# Patient Record
Sex: Female | Born: 1970 | Race: White | Hispanic: No | Smoking: Current every day smoker
Health system: Southern US, Community
[De-identification: ages and names within clinical notes are randomized; demographics above are authoritative.]

## PROBLEM LIST (undated history)

## (undated) DIAGNOSIS — J449 Chronic obstructive pulmonary disease, unspecified: Secondary | ICD-10-CM

## (undated) DIAGNOSIS — A630 Anogenital (venereal) warts: Secondary | ICD-10-CM

## (undated) DIAGNOSIS — F329 Major depressive disorder, single episode, unspecified: Secondary | ICD-10-CM

## (undated) HISTORY — DX: Major depressive disorder, single episode, unspecified: F32.9

## (undated) HISTORY — PX: TUBAL LIGATION: SHX77

## (undated) HISTORY — DX: Anogenital (venereal) warts: A63.0

---

## 1997-05-31 ENCOUNTER — Ambulatory Visit (HOSPITAL_COMMUNITY): Admission: RE | Admit: 1997-05-31 | Discharge: 1997-05-31 | Payer: Self-pay | Admitting: *Deleted

## 1997-06-09 ENCOUNTER — Inpatient Hospital Stay (HOSPITAL_COMMUNITY): Admission: AD | Admit: 1997-06-09 | Discharge: 1997-06-09 | Payer: Self-pay | Admitting: Obstetrics & Gynecology

## 1997-06-27 ENCOUNTER — Encounter (HOSPITAL_COMMUNITY): Admission: RE | Admit: 1997-06-27 | Discharge: 1997-07-25 | Payer: Self-pay | Admitting: *Deleted

## 1997-07-23 ENCOUNTER — Encounter: Admission: RE | Admit: 1997-07-23 | Discharge: 1997-07-23 | Payer: Self-pay | Admitting: Sports Medicine

## 1997-07-24 ENCOUNTER — Inpatient Hospital Stay (HOSPITAL_COMMUNITY): Admission: AD | Admit: 1997-07-24 | Discharge: 1997-07-26 | Payer: Self-pay | Admitting: Obstetrics

## 1997-07-31 ENCOUNTER — Encounter: Admission: RE | Admit: 1997-07-31 | Discharge: 1997-07-31 | Payer: Self-pay | Admitting: Family Medicine

## 1997-09-12 ENCOUNTER — Encounter: Admission: RE | Admit: 1997-09-12 | Discharge: 1997-09-12 | Payer: Self-pay | Admitting: Family Medicine

## 1997-11-25 ENCOUNTER — Emergency Department (HOSPITAL_COMMUNITY): Admission: EM | Admit: 1997-11-25 | Discharge: 1997-11-25 | Payer: Self-pay | Admitting: Emergency Medicine

## 1998-12-19 ENCOUNTER — Encounter (INDEPENDENT_AMBULATORY_CARE_PROVIDER_SITE_OTHER): Payer: Self-pay | Admitting: *Deleted

## 1999-01-05 ENCOUNTER — Emergency Department (HOSPITAL_COMMUNITY): Admission: EM | Admit: 1999-01-05 | Discharge: 1999-01-05 | Payer: Self-pay | Admitting: Emergency Medicine

## 1999-01-15 ENCOUNTER — Emergency Department (HOSPITAL_COMMUNITY): Admission: EM | Admit: 1999-01-15 | Discharge: 1999-01-16 | Payer: Self-pay | Admitting: Emergency Medicine

## 1999-01-27 ENCOUNTER — Emergency Department (HOSPITAL_COMMUNITY): Admission: EM | Admit: 1999-01-27 | Discharge: 1999-01-27 | Payer: Self-pay | Admitting: Emergency Medicine

## 1999-04-07 ENCOUNTER — Encounter: Payer: Self-pay | Admitting: Emergency Medicine

## 1999-04-07 ENCOUNTER — Emergency Department (HOSPITAL_COMMUNITY): Admission: EM | Admit: 1999-04-07 | Discharge: 1999-04-07 | Payer: Self-pay | Admitting: Emergency Medicine

## 1999-05-14 ENCOUNTER — Emergency Department (HOSPITAL_COMMUNITY): Admission: EM | Admit: 1999-05-14 | Discharge: 1999-05-15 | Payer: Self-pay | Admitting: Emergency Medicine

## 1999-05-25 ENCOUNTER — Encounter: Admission: RE | Admit: 1999-05-25 | Discharge: 1999-05-25 | Payer: Self-pay | Admitting: Family Medicine

## 1999-08-27 ENCOUNTER — Encounter: Admission: RE | Admit: 1999-08-27 | Discharge: 1999-08-27 | Payer: Self-pay | Admitting: Family Medicine

## 1999-10-20 ENCOUNTER — Inpatient Hospital Stay (HOSPITAL_COMMUNITY): Admission: EM | Admit: 1999-10-20 | Discharge: 1999-10-23 | Payer: Self-pay | Admitting: Emergency Medicine

## 1999-10-27 ENCOUNTER — Inpatient Hospital Stay (HOSPITAL_COMMUNITY): Admission: RE | Admit: 1999-10-27 | Discharge: 1999-11-07 | Payer: Self-pay | Admitting: Psychiatry

## 1999-11-05 ENCOUNTER — Encounter: Payer: Self-pay | Admitting: Neurology

## 2000-01-23 ENCOUNTER — Inpatient Hospital Stay (HOSPITAL_COMMUNITY): Admission: EM | Admit: 2000-01-23 | Discharge: 2000-01-28 | Payer: Self-pay | Admitting: *Deleted

## 2001-04-18 ENCOUNTER — Inpatient Hospital Stay (HOSPITAL_COMMUNITY): Admission: EM | Admit: 2001-04-18 | Discharge: 2001-04-26 | Payer: Self-pay | Admitting: Psychiatry

## 2001-05-02 ENCOUNTER — Inpatient Hospital Stay (HOSPITAL_COMMUNITY): Admission: EM | Admit: 2001-05-02 | Discharge: 2001-05-03 | Payer: Self-pay | Admitting: Psychiatry

## 2003-08-15 ENCOUNTER — Encounter: Admission: RE | Admit: 2003-08-15 | Discharge: 2003-08-15 | Payer: Self-pay | Admitting: Family Medicine

## 2003-08-15 ENCOUNTER — Encounter (INDEPENDENT_AMBULATORY_CARE_PROVIDER_SITE_OTHER): Payer: Self-pay | Admitting: *Deleted

## 2003-11-13 ENCOUNTER — Emergency Department (HOSPITAL_COMMUNITY): Admission: EM | Admit: 2003-11-13 | Discharge: 2003-11-14 | Payer: Self-pay | Admitting: Emergency Medicine

## 2003-11-13 ENCOUNTER — Emergency Department (HOSPITAL_COMMUNITY): Admission: EM | Admit: 2003-11-13 | Discharge: 2003-11-13 | Payer: Self-pay | Admitting: Family Medicine

## 2003-11-27 ENCOUNTER — Encounter: Admission: RE | Admit: 2003-11-27 | Discharge: 2003-11-27 | Payer: Self-pay | Admitting: Family Medicine

## 2005-10-08 ENCOUNTER — Emergency Department (HOSPITAL_COMMUNITY): Admission: EM | Admit: 2005-10-08 | Discharge: 2005-10-08 | Payer: Self-pay | Admitting: Family Medicine

## 2006-06-16 DIAGNOSIS — M26609 Unspecified temporomandibular joint disorder, unspecified side: Secondary | ICD-10-CM | POA: Insufficient documentation

## 2006-06-16 DIAGNOSIS — F339 Major depressive disorder, recurrent, unspecified: Secondary | ICD-10-CM | POA: Insufficient documentation

## 2006-06-16 DIAGNOSIS — R8789 Other abnormal findings in specimens from female genital organs: Secondary | ICD-10-CM

## 2006-06-16 DIAGNOSIS — A630 Anogenital (venereal) warts: Secondary | ICD-10-CM

## 2006-06-17 ENCOUNTER — Encounter (INDEPENDENT_AMBULATORY_CARE_PROVIDER_SITE_OTHER): Payer: Self-pay | Admitting: *Deleted

## 2007-05-26 ENCOUNTER — Emergency Department (HOSPITAL_COMMUNITY): Admission: EM | Admit: 2007-05-26 | Discharge: 2007-05-26 | Payer: Self-pay | Admitting: Family Medicine

## 2007-12-11 ENCOUNTER — Ambulatory Visit: Payer: Self-pay | Admitting: Sports Medicine

## 2007-12-11 ENCOUNTER — Encounter: Payer: Self-pay | Admitting: Family Medicine

## 2007-12-11 DIAGNOSIS — N920 Excessive and frequent menstruation with regular cycle: Secondary | ICD-10-CM

## 2007-12-11 LAB — CONVERTED CEMR LAB
HCT: 45.2 % (ref 36.0–46.0)
MCHC: 32.5 g/dL (ref 30.0–36.0)
MCV: 94.6 fL (ref 78.0–100.0)
Platelets: 159 10*3/uL (ref 150–400)
RDW: 14 % (ref 11.5–15.5)

## 2007-12-12 ENCOUNTER — Encounter: Admission: RE | Admit: 2007-12-12 | Discharge: 2007-12-12 | Payer: Self-pay | Admitting: Family Medicine

## 2008-01-11 ENCOUNTER — Ambulatory Visit: Payer: Self-pay | Admitting: Family Medicine

## 2008-02-08 ENCOUNTER — Ambulatory Visit: Payer: Self-pay | Admitting: Family Medicine

## 2008-12-12 ENCOUNTER — Encounter: Admission: RE | Admit: 2008-12-12 | Discharge: 2008-12-12 | Payer: Self-pay | Admitting: Family Medicine

## 2009-01-06 ENCOUNTER — Ambulatory Visit: Payer: Self-pay | Admitting: Family Medicine

## 2009-01-25 ENCOUNTER — Encounter (INDEPENDENT_AMBULATORY_CARE_PROVIDER_SITE_OTHER): Payer: Self-pay | Admitting: *Deleted

## 2009-01-25 DIAGNOSIS — F17200 Nicotine dependence, unspecified, uncomplicated: Secondary | ICD-10-CM

## 2009-12-15 ENCOUNTER — Encounter: Admission: RE | Admit: 2009-12-15 | Discharge: 2009-12-15 | Payer: Self-pay | Admitting: Family Medicine

## 2010-03-20 ENCOUNTER — Encounter: Payer: Self-pay | Admitting: Sports Medicine

## 2010-03-20 ENCOUNTER — Ambulatory Visit: Payer: Self-pay | Admitting: Family Medicine

## 2010-03-20 ENCOUNTER — Ambulatory Visit (HOSPITAL_COMMUNITY)
Admission: RE | Admit: 2010-03-20 | Discharge: 2010-03-20 | Payer: Self-pay | Source: Home / Self Care | Admitting: Family Medicine

## 2010-03-24 ENCOUNTER — Encounter: Admission: RE | Admit: 2010-03-24 | Discharge: 2010-03-24 | Payer: Self-pay | Admitting: Sports Medicine

## 2010-04-07 ENCOUNTER — Ambulatory Visit: Payer: Self-pay | Admitting: Family Medicine

## 2010-04-07 DIAGNOSIS — B341 Enterovirus infection, unspecified: Secondary | ICD-10-CM | POA: Insufficient documentation

## 2010-05-10 ENCOUNTER — Encounter: Payer: Self-pay | Admitting: Emergency Medicine

## 2010-05-17 LAB — CONVERTED CEMR LAB: Anti Nuclear Antibody(ANA): NEGATIVE

## 2010-05-19 NOTE — Assessment & Plan Note (Signed)
Summary: congested/rash?fungus to feet/[facility] team/[facility]   Vital Signs:  Patient profile:   40 year old female Weight:      159 pounds Temp:     98.4 degrees F oral Pulse rate:   84 / minute BP sitting:   112 / 84  (left arm) Cuff size:   regular  Vitals Entered By: Garen Grams LPN (March 20, 2010 10:42 AM) CC: chest pain x 4days Is Patient Diabetic? No   Primary Care Provider:  . [facility]  CC:  chest pain x 4days.  History of Present Illness: 40 yo female with pleuritic CP.  Present for 4d now, Located middle of chest and radiating to bilateral upper lung fields.  Has had a recent URI with cough, sniffles, father has a throat infection, some body aches for 4-5 days.  CP is worse with deep breathing and is burning in nature, worse with belching, no association with exertion and no better with rest, has been constant for the past 4 days.  She does smoke and has occasional wheezing but no SOB.  Cough is NP.  No sour brash, no ST, no epigastric burning, no throat clearing, pain is not positional.  Habits & Providers  Alcohol-Tobacco-Diet     Tobacco Status: current     Cigarette Packs/Day: 0.5  Current Medications (verified): 1)  Ventolin Hfa 108 (90 Base) Mcg/act Aers (Albuterol Sulfate) .... 2 Puffs Q 4 Hours As Needed For Wheezing 2)  Mobic 7.5 Mg Tabs (Meloxicam) .... One To Two Tabs By Mouth Daily For Pain.  Allergies (verified): No Known Drug Allergies  Review of Systems       See HPI  Physical Exam  General:  Well-developed,well-nourished,in no acute distress; alert,appropriate and cooperative throughout examination Head:  Normocephalic and atraumatic without obvious abnormalities. Eyes:  No corneal or conjunctival inflammation noted. EOMI. Perrl Ears:  External ear exam shows no significant lesions or deformities.  Otoscopic examination reveals clear canals, tympanic membranes are intact bilaterally without bulging, retraction, inflammation or discharge. Hearing  is grossly normal bilaterally. Nose:  Turbinates edematous and erythematous.  Clear discharge. Mouth:  Oral mucosa and oropharynx without lesions or exudates.  Neck:  No deformities, masses, or tenderness noted. Lungs:  Before nebs: Diffuse inspiratory wheezes, no rubs, good air movement.  After nebs: Clear to auscultation bilaterally. Heart:  Normal rate and regular rhythm. S1 and S2 normal without gallop, murmur, click, rub or other extra sounds. Abdomen:  Bowel sounds positive,abdomen soft and non-tender without masses, organomegaly or hernias noted. Extremities:  No edema. Additional Exam:  12 lead ECG NSR with incomplete RBBB, normal axis, no ST changes.  GI cocktail did not cause resolution of symptoms.  Albuterol/atrovent nebs did not cause resolution of pain symptoms.   Impression & Recommendations:  Problem # 1:  CHEST PAIN UNSPECIFIED (ICD-786.50) Assessment New Pleuritic CP without improvement with GI cocktail, nothing to suggest cardiac etiology, no improvement in pain with bronchodilators and associated with recent URI symptoms likely pleuritis/pleurisy.   Usually viral. Treat with NSAIDS. Checking ANA and D dimer as autoimmune etiologies are common and need to ensure not a PE. As she did have significant improvement in clinical signs of obstruction with BD nebs and with Hx smoking she needs to make appt with Dr. Raymondo Band in pharmacy clinic after she is over this illness for PFTs. RTC 1-2 weeks to reassess symptoms.  Orders: EKG- [facility] (EKG) CXR- [facility] (CXR) [facility]- Est  Level 4 (99214) ANA-[facility] (09811-91478) D-Dimer- [facility] (29562-13086)  Problem # 2:  PLEURISY (ICD-511.0) Assessment: New See #1.  Orders: ANA-[facility] (16109-60454) D-Dimer- [facility] [phone]) Atrovent 1mg  (Neb) [phone]) Albuterol Sulfate Sol 1mg  unit dose (Z3086) EMR miscellaneous medications (EMRORAL)  Complete Medication List: 1)  Ventolin Hfa 108 (90 Base) Mcg/act Aers (Albuterol sulfate) .... 2 puffs q 4  hours as needed for wheezing 2)  Mobic 7.5 Mg Tabs (Meloxicam) .... One to two tabs by mouth daily for pain.  Patient Instructions: 1)  I think you have pleurisy from a virus. 2)  This is self limited, take mobic for the pain. 3)  We gave you a GI cocktail and neb treatment in the office. 4)  You need to make an appt with Dr. Raymondo Band to do PFTs (pulmonary function testing). 5)  Come back to followup your symptoms in 1-2 weeks.  Return sooner if you develop severe shortness of breath. 6)  -Dr. Karie Schwalbe. Prescriptions: VENTOLIN HFA 108 (90 BASE) MCG/ACT AERS (ALBUTEROL SULFATE) 2 puffs q 4 hours as needed for wheezing Brand medically necessary #1 x 3   Entered and Authorized by:   Rodney Langton MD   Signed by:   Rodney Langton MD on 03/20/2010   Method used:   Print then Give to Patient   RxID:   [id number] MOBIC 7.5 MG TABS (MELOXICAM) One to two tabs by mouth daily for pain.  #30 x 0   Entered and Authorized by:   Rodney Langton MD   Signed by:   Rodney Langton MD on 03/20/2010   Method used:   Print then Give to Patient   RxID:   [id number]    Medication Administration  Medication # 1:    Medication: Albuterol Sulfate Sol 1mg  unit dose    Diagnosis: PLEURISY (ICD-511.0)    Dose: 2.5mg /91mL    Route: inhaled    Exp Date: 07/18/2011    Lot #: [id number]    Mfr: nephron    Patient tolerated medication without complications    Given by: Loralee Pacas CMA (March 20, 2010 11:34 AM)  Medication # 2:    Medication: Atrovent 1mg  (Neb)    Diagnosis: PLEURISY (ICD-511.0)    Dose: 0.5mg     Route: inhaled    Exp Date: 11/17/2011    Lot #: [id number]    Mfr: nephron    Patient tolerated medication without complications    Given by: Loralee Pacas CMA (March 20, 2010 11:35 AM)  Medication # 3:    Medication: EMR miscellaneous medications    Diagnosis: PLEURISY (ICD-511.0)    Dose:    Route: po    Exp Date: [id number]    Lot #: [id number]    Mfr: [facility]  pharmacy    Patient tolerated medication without complications    Given by: Loralee Pacas CMA (March 20, 2010 11:36 AM)  Orders Added: 1)  EKG- [facility] [EKG] 2)  CXR- [facility] [CXR] 3)  [facility]- Est  Level 4 [99214] 4)  ANA-[facility] [30160-10932] 5)  D-Dimer- [facility] [35573-22025] 6)  Atrovent 1mg  (Neb) [K2706] 7)  Albuterol Sulfate Sol 1mg  unit dose [J7613] 8)  EMR miscellaneous medications [EMRORAL]

## 2010-05-21 NOTE — Assessment & Plan Note (Signed)
Summary: F/U /RH   Vital Signs:  Patient profile:   40 year old female Weight:      158 pounds Temp:     98.4 degrees F oral Pulse rate:   103 / minute Pulse rhythm:   regular BP sitting:   119 / 88  (left arm) Cuff size:   regular  Vitals Entered By: Loralee Pacas CMA (April 07, 2010 10:03 AM) CC: cold x 4 days   Primary Care Provider:  . [facility]  CC:  cold x 4 days.  History of Present Illness: 40 yo female with URI symptoms for 4 days.  URI Symptoms Onset: 4d Description: runny nose, congestion, cough, stuffy ears. Modifying factors:  Pt with recent pleuritis that resolved with NSAIDS, neg ANA/d-dimer.  Now with another URI syndrome.  Symptoms Nasal discharge: CLEAR Fever: Subjective Sore throat: Minimal Cough: Yes, yellowship sputum, NB Wheezing: Minimal Ear pain: NO GI symptoms: NO Sick contacts: NO  Red Flags  Stiff neck: NO Dyspnea: NO Rash: NO Swallowing difficulty: NO  Sinusitis Risk Factors Headache/face pain: NO Double sickening: NO tooth pain: NO  Allergy Risk Factors Sneezing: NO Itchy scratchy throat: NO Seasonal symptoms: NO  Flu Risk Factors Headache: NO muscle aches: NO severe fatigue: NO    Current Medications (verified): 1)  Ventolin Hfa 108 (90 Base) Mcg/act Aers (Albuterol Sulfate) .... 2 Puffs Q 4 Hours As Needed For Wheezing 2)  Mobic 7.5 Mg Tabs (Meloxicam) .... One To Two Tabs By Mouth Daily For Pain. 3)  Theraflu Max-D Cold & Flu [phone] Mg Pack (Pseudoephedrine-Dm-Gg-Apap) .... Use As Directed  Allergies (verified): No Known Drug Allergies  Review of Systems       See HPI  Physical Exam  General:  Well-developed,well-nourished,in no acute distress; alert,appropriate and cooperative throughout examination Head:  Normocephalic and atraumatic without obvious abnormalities. No apparent alopecia or balding. Eyes:  No corneal or conjunctival inflammation noted. EOMI. Perrla.  Ears:  External ear exam  shows no significant lesions or deformities.  Otoscopic examination reveals clear canals, tympanic membranes are intact bilaterally without bulging, retraction, inflammation or discharge. Hearing is grossly normal bilaterally. Nose:  External nasal examination shows no deformity or inflammation. Nasal mucosa are pink and moist without lesions or exudates. Mouth:  papules with erythematous bases on soft palate. Neck:  No deformities, masses, or tenderness noted. Lungs:  Normal respiratory effort, chest expands symmetrically. Lungs are clear to auscultation, no crackles or wheezes. Heart:  Normal rate and regular rhythm. S1 and S2 normal without gallop, murmur, click, rub or other extra sounds. Abdomen:  Bowel sounds positive,abdomen soft and non-tender without masses, organomegaly or hernias noted.   Impression & Recommendations:  Problem # 1:  COXSACKIE VIRUS INFECTION (ICD-079.2) Assessment New Previous pleuritis resolved but likely Coxsackie B virus. Currently with signs suggestive of herpangina, usually caused by Coxsackie A however can be caused by Coxsackie B as well. Again recommend supportive measures. Hydration. Theraflu. Meloxicam. Vicks to chest. Humidifier. Pt still hasn't been to see Dr. Raymondo Band for PFTs as she has not yet been completely over her illnesses.  She plans to call to set up appt when this illness has resolved. RTC as needed.  Orders: [facility]- Est Level  3 (99213)  Complete Medication List: 1)  Ventolin Hfa 108 (90 Base) Mcg/act Aers (Albuterol sulfate) .... 2 puffs q 4 hours as needed for wheezing 2)  Mobic 7.5 Mg Tabs (Meloxicam) .... One to two tabs by mouth daily for pain. 3)  Theraflu  Max-d Cold & Flu [phone] Mg Pack (Pseudoephedrine-dm-gg-apap) .... Use as directed  Patient Instructions: 1)  You have another viral infection likely caused by the same virus, coxsackie virus which can cause pleuritis and pain with deep breathing like before as well as sore  throat and spots in the back of the throat like you have (herpangina). 2)  Theraflu. 3)  Continue your mobic for pain. 4)  Hydration. 5)  Come back to see Dr. Raymondo Band for PFTs when you are better. 6)  -Dr. Karie Schwalbe. Prescriptions: THERAFLU MAX-D COLD & FLU [phone] MG PACK (PSEUDOEPHEDRINE-DM-GG-APAP) Use as directed  #1 box x 0   Entered and Authorized by:   Rodney Langton MD   Signed by:   Rodney Langton MD on 04/07/2010   Method used:   Print then Give to Patient   RxID:   [id number]    Orders Added: 1)  [facility]- Est Level  3 [13244]

## 2010-06-08 ENCOUNTER — Encounter: Payer: Self-pay | Admitting: *Deleted

## 2010-09-04 NOTE — Discharge Summary (Signed)
[facility]  Patient:    Diane Pacheco                  MRN: [id number] Adm. Date:  [id number] Disc. Date: [id number] Attending:  Hipolito Bayley J                           Discharge Summary  INTRODUCTION:  Diane Pacheco is a 40 year old white female who [facility]ation of [facility].  Upon admission, she had thoughts of suicide with plan to overdose or cutting the wrists or jumping off the bridge.  The details of patients history and circumstances surrounding admission are available on the chart. Prior to admission she was on Klonopin, Effexor, Trazodone, Restoril and Remeron.  The patient was under the care of [facility] mental health center.  HOSPITAL COURSE:  After admission to the ward, patient was placed on special observation.  Routine blood work was ordered.  Her Effexor was restarted in dose 100 mg in the morning, 75 mg p.m., Klonopin 1 mg three times per day, and 2 mg at bedtime, Remeron 30 mg at bedtime, Restoril p.r.n. insomnia, and Seroquel p.r.n. insomnia.  On October 8, patient was still not sleeping, anxious, tremulous.  Had some confusing racing thoughts, denied suicidal ideations.  I increased Klonopin and ultimately decreased antidepressant, feeling that maybe these symptoms were coming from too high dose of antidepressant.  Next day, patient still had poor sleep and had some craving for alcohol.  There were no signs of alcohol withdrawal however.  No adjustments of medication were done since we felt we can wait a moment to see how things are getting from current medication.  On October 8, I started Zyprexa and Inderal in order to keep her up with anxious feelings and tremulousness.  Klonopin was also increased to 6 mg daily.  On October 10, patient was doing better and had successful session with father.  She felt better, denied dangerous thoughts, had some plan  for child care, and taking care of her own needs.  On October 11, she still had a pattern of seeking for medication, but much more rational and composed than before.  Anxiety and nervousness were gone and no suicidal or homicidal thoughts, no hallucinations.  She slept better.  Decision was made to discharge her home.  MEDICAL PROBLEMS:  During this brief hospitalization, patient did not experience any medical problems.  Review of her blood work showed no normal chemistry 17, slight elevation of TSH but with upper normal T3 uptake and free thyroxin.  Urine drug screen was positive for benzodiazepines, otherwise negative.  It was not a surprise since patient was on Klonopin.  Urinalysis was normal.  Throughout this brief hospitalization, patients vital signs were stable.  DISCHARGE DIAGNOSES: Axis I:     1. Depressive disorder not otherwise specified.             2. Generalized anxiety disorder. 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 Axis IV:    Moderate stressors, work problems and financial problems, as well             as family problems. Axis V:     Global assessment of function upon admission 40, maximum  for past year 46, upon discharge 50.  DISCHARGE RECOMMENDATIONS:  Patient is supposed to continue on Klonopin 2 mg three times a day, Effexor XR 150 mg in the morning and 75 mg afternoon, Remeron 15 mg at bedtime, Zyprexa 5 mg half or one at bedtime, Inderal 10 mg one three times a day, Vistaril 25 mg only if needed for anxiety.  I recommended to check thyroid profile at local family M.D. since there were minor abnormalities found, probably clinically nonsignificant.  Patient should call her psychiatrist if problems with medication or recurrence of depressive symptoms.  She will follow up with [facility] and patient is supposed to call for appointment.  Case worker was  working with her to schedule appointment but it is not clear whether it was accomplished.  Patient received a two week supply with one refill on the medications listed above.  PROGNOSIS:  Guarded, considering pattern of behavior from the past. Nevertheless, patient was discharged in maximum improved condition with no dangerousness or psychotic features present. DD:  02/07/99 TD:  02/08/00 Job: [id number]

## 2010-09-04 NOTE — Discharge Summary (Signed)
[facility]  Patient:    Diane Pacheco, Diane Pacheco Visit Number: [id number] MRN: [id number]          Service Type: PSY Location: [id number] [id number] Attending Physician:  Rachael Fee Dictated by:   Reymundo Poll Dub Mikes, M.D. Admit Date:  05/02/2001 Discharge Date: 05/03/2001                             Discharge Summary  CHIEF COMPLAINT AND PRESENTING ILLNESS:  This was the second admission, or readmission, to [facility] for this 40 year old female who was discharged from [facility] April 21, 2001.  Presented to the emergency room with her father, complained of her being overly drowsy, at times agitated and escalating over the past 2 days.  Has had some increased agitation and anxiety, January 1 through the January 10, after being visited by the Department of Social Services, who did a home visit to inspect related to her ability of caring for her children.  This caused her considerable agitation and anxiety.  Prior, she had a friend at the home that became involved in substance abuse.  PAST PSYCHIATRIC HISTORY:  As already stated, recently discharged from [facility] where she was discharged stable.  SUBSTANCE ABUSE HISTORY:  Sober for 15 months.  Has used marijuana recently.  MEDICATIONS:  Upon admission, Effexor XR, Lexapro, Klonopin, Restoril, Ambien, Neurontin, Seroquel.  ADMITTING  DIAGNOSES: Axis I:    1. Major depression, recurrent, with psychotic features.            2. Marijuana abuse. Axis II:   No diagnosis. Axis III:  No diagnosis. Axis IV:   Moderate. Axis V:    Global assessment of function upon admission 38, highest            global assessment of function in past year 68.  COURSE IN THE HOSPITAL:  She was admitted and started on intensive individual and group psychotherapy.  The approach was crisis intervention.  The medication was adjusted.  She was kept on Klonopin 0.5 twice a day, Restoril 30 at  bedtime for sleep, Seroquel 400 at bedtime, Effexor XR 75 twice a day, Neurontin 400 3 times a day, Risperdal 0.25 twice a day.  She responded to the medications, being back in the unit, worked on Pharmacologist.  Things with DSS were clarified.  She felt that she would be safe to go home, that she was in no danger.  denied any suicidal or homicidal ideas, therefore she was discharged to outpatient followup.  DISCHARGE  DIAGNOSES: Axis I:    1. Major depression with psychotic features.            2. Anxiety disorder not otherwise specified.            3. Marijuana abuse. Axis II:   No diagnosis. Axis III:  No diagnosis. Axis IV:   Moderate. Axis V:    Global assessment of function upon discharge 65.  DISCHARGE MEDICATIONS: 1. Klonopin 0.5 twice a day. 2. Restoril 30 at bedtime for sleep. 3. Seroquel 400 at bedtime. 4. Effexor XR 150, 75 twice a day. 5. Neurontin 400 1 3 times a day. 6. Risperdal 0.25 1 twice a day.  DISPOSITION:  Discharged to outpatient followup. Dictated by:   Reymundo Poll Dub Mikes, M.D. Attending Physician:  Rachael Fee DD:  06/07/01 TD:  06/07/01 Job: [id number]

## 2010-09-04 NOTE — H&P (Signed)
[facility]  Patient:    Diane Pacheco, Diane Pacheco Visit Number: [id number] MRN: [id number]          Service Type: PSY Location: 400 [id number] 02 Attending Physician:  Rachael Fee Dictated by:   Young Berry Scott, R.N. N.P. Admit Date:  05/02/2001 Discharge Date: 05/03/2001                     Psychiatric Admission Assessment  DATE OF ADMISSION:  May 02, 2001  DATE OF ASSESSMENT:  May 03, 2001, at 1:15 p.m.  PATIENT IDENTIFICATION:  This is a 40 year old Caucasian female who is separated.  She is a readmission here and she is a voluntary readmission.  HISTORY OF PRESENT ILLNESS:  This patient was readmitted after being discharged from [facility] on April 25, 2001.  She presented to the emergency room with her father who complained of her being increasingly agitated and drowsy over the past couple of days.  Today the patient reports that she had been increasingly agitated and anxious on January 13 after being visited by DSS Child Protective Services inspectors who were there to check on her care of her children.  This was because of an episode with the patients friend who had a behavioral outburst involving substance abuse and the police had been called.  Child welfare workers were calling subsequent to this episode.  The patient today reports no suicidal ideation or homicidal ideation but does report some persistent visual hallucinations which are nonthreatening to her seeing shadowy figures that ambulate alongside her in the hallways.  Denies any auditory or visual hallucinations.  She states her sleep has been satisfactory.  She admits that she had been smoking marijuana but reports that she has been clean and free of alcohol and any other substances.  PAST PSYCHIATRIC HISTORY:  The patient is followed at [facility] by Dr. Yetta Barre.  This is her fourth admission to [facility] with her last one being April 25, 2001.  She  also has a history of prior admissions at [facility], [facility], and [facility].  The patient has a history of at least two prior suicide attempts.  SUBSTANCE ABUSE HISTORY:  The patient reports that she has been sober for the past 15 months with a history of heavy alcohol use in the past.  The patients urine drug screen was positive for THC but negative for any other substances.  PAST MEDICAL HISTORY:  The patient is followed by a physician in [place] and she cannot remember his name.  Medical problems include temporomandibular joint dysfunction which is not bothering her at this time.  MEDICATIONS:  1. Klonopin 0.5 mg one tablet three times a day.  2. Klonopin 1 mg at bedtime.  3. Restoril 30 mg at bedtime.  4. Seroquel 200 mg two tablets at bedtime.  5. Effexor XR 150 mg one twice daily.  6. Neurontin 600 mg one three times a day.  7. Risperdal 0.25 mg one twice a day.  8. Lexapro 10 mg one half tablet daily.  9. Ambien 10 mg one at h.s. p.r.n. sleep. 10. Vistaril 50 mg one tablet q.6h. p.r.n. for anxiety.  DRUG ALLERGIES:  None.  PHYSICAL EXAMINATION:  GENERAL:  The patients physical examination was done at the [facility] Emergency Department and was essentially unremarkable.  LABORATORY DATA:  CMET and urinalysis were within normal limits at that time as was her CBC.  We have chosen  not to repeat any other labs at this time.  SOCIAL HISTORY:  The patient has been separated from her husband for the past two years.  She has three children ages 29, 2, and 6 months.  She has completed the 11th grade.  She is not working currently and lives with her children in Section 8 housing.  As stated, she is unemployed.  She has Medicaid to assist her in paying for medications and health care for her children and her father also assists her by giving her extra money.  FAMILY HISTORY:  Negative for mental illness or substance  abuse.  MENTAL STATUS EXAMINATION:  This is a young female who is fully alert, appears healthy with motor function grossly normal.  She is in no acute distress.  She is polite and cooperative with a full affect.  Speech is normal and relevant without pressure.  Mood is fairly euthymic.  Thought process is logical and goal directed.  She has no evidence of auditory hallucinations at this time. She does seem to be having some very mild visual hallucinations which seem intermittent in nature although I do not believe she is having them actually during our interview.  She has no evidence of paranoia or delusions, is well focused in her thought process and seems fairly candid about her activities at home and her plans for herself after discharge.  She seems well organized with good focus, is quite composed.  No evidence of suicidal ideation, no homicidal ideation.  Cognitive: Intact and oriented x 4.  Insight is fair to good. Intelligence is average.  Impulse control: Within normal limits.  Judgment: Satisfactory.  ADMISSION DIAGNOSES: Axis I:    1. Major depression, recurrent, moderate with psychotic features.            2. Tetrahydrocannabinol abuse.            3. Ethyl alcohol abuse currently in remission. Axis II:   Deferred. Axis III:  Temporomandibular joint pain, none currently. Axis IV:   Moderate problems with concerns regarding her child care and            satisfying any requirements of county Department of Social            Services. Axis V:    Current 40, past year 17.  INITIAL PLAN OF CARE:  Plan is to voluntarily admit the patient to treat her depression with a goal of stabilizing her mood.  We will decrease her Effexor to 75 mg p.o. b.i.d. and continue her Vistaril as is.  We have elected to stop her Lexapro.  This may be contributing to some of her mood fragility.  We have also decreased her Klonopin to 0.5 mg b.i.d. and will continue her Restoril at 30 mg p.o. q.h.s. and  continue her Seroquel at 400 mg p.o. q.h.s.  We have also decreased her Neurontin to 400 mg p.o. t.i.d.   ESTIMATED LENGTH OF STAY:  Four to five days. Dictated by:   Young Berry Scott, R.N. N.P. Attending Physician:  Rachael Fee DD:  05/03/01 TD:  05/03/01 Job: [id number]

## 2010-09-04 NOTE — H&P (Signed)
[facility]  Patient:    Diane Pacheco, Diane Pacheco Visit Number: [id number] MRN: [id number]          Service Type: PSY Location: 400 0404 02 Attending Physician:  Jeanice Lim Dictated by:   Young Berry Scott, N.P. Admit Date:  05/02/2001                     Psychiatric Admission Assessment  CONTINUATION  MEDICATIONS:  Klonopin 0.5 mg, 1 t.i.d. and 1 mg q.h.s., Restoril 30 mg q.h.s., Seroquel 200 mg, 2 tablets q.h.s., Effexor XR 150 mg, 1 b.i.d., Neurontin 600 mg, 1 three times a day, Risperdal 0.25 mg, 1 twice a day, Lexapro 10 mg, 1/2 tab daily, Ambien 10 mg, 1 q.h.s. p.r.n. for sleep, Vistaril 50 mg, 1 q.6h. p.r.n. for anxiety.  DRUG ALLERGIES:  None.  POSITIVE PHYSICAL FINDINGS:  The patients physical examination, done at [facility] Emergency Department, is essentially unremarkable.  Vital signs, on admission, were temperature 96.6, pulse 83, blood pressure 104/72.  She weighed 143 pounds and is 5 feet 4 inches tall.  LABORATORY DATA:  Her CMET and urinalysis were within normal limits.  Her urine drug screen showed benzodiazepines and THC.  Her CBC is within normal limits.  Her thyroid panel was satisfactory on the previous admission, so we will no repeat this at this time.  MENTAL STATUS EXAMINATION:  This is a healthy-appearing, young female, who is fully alert and in no acute distress.  She is polite and cooperative with a full affect.  She is composed and focused.  Speech is normal and relevant without pressure.  It is spontaneous.  Mood is euthymic.  Thought process is logical and goal directed.  She does seem to be having some mild visual hallucinations and no evidence of auditory hallucinations.  No evidence of paranoia or delusions and no dangerous ideations.  No suicidal or homicidal ideation.  Cognitively, she is intact and oriented x 4.  DIAGNOSES: Axis I:    1. Major depression, recurrent, moderate with psychotic features.             2. Marijuana abuse.            3. Alcohol abuse, currently in remission. Axis II:   Deferred. Axis III:  Temporomandibular joint pain with none now. Axis IV:   Moderate (problems related to her need for support as she remains            clean off alcohol and substances and her concerns about her ability            to care for her children properly and approval from DSS Protective            Services). Axis V:    Current 38; past year 92.  PLAN:  Voluntarily admit the patient to treat her depression with a goal of stabilizing her mood.  We have chosen to decrease her Effexor to 75 mg p.o. b.i.d. and to stop her Lexapro at this time.  We decreased her Klonopin to 0.5 mg b.i.d.  We will continue her Restoril at 30 mg p.o. q.h.s. and Ambien 10 mg p.r.n. if she remains awake and we have continued her Seroquel to 400 mg p.o. q.h.s. and decreased her Neurontin to 400 mg p.o. t.i.d.  ESTIMATED LENGTH OF STAY:  Four to five days. Dictated by:   Young Berry Scott, N.P. Attending Physician:  Jeanice Lim DD:  05/02/01 TD:  05/03/01 Job: [id number]  [id number]

## 2010-09-04 NOTE — H&P (Signed)
[facility]  Patient:    Diane Pacheco, WEICHERT Visit Number: [id number] MRN: [id number]          Service Type: PSY Location: 400 0404 02 Attending Physician:  Jeanice Lim Dictated by:   Young Berry Scott, N.P. Admit Date:  05/02/2001                     Psychiatric Admission Assessment  INCOMPLETE  DATE OF ASSESSMENT:  May 02, 2001 at 1:15 p.m.  IDENTIFYING INFORMATION:  This is a 40 year old Caucasian female, who is separated.  She is a voluntary readmission.  HISTORY OF PRESENT ILLNESS:  This patient, who is being readmitted after being discharged from [facility] on April 25, 2001, after being treated for depression with psychotic features, presented in the emergency room last night with her father, who complained of her being overly drowsy and, at other times, agitated and is escalating over the past two days. Today, the patient reports that she had had some increased agitation and anxiety on May 01, 2001 after being visited by Department of Social Services, Child [employer] aid/development worker, who did a home visit to inspect related to her capability of caring for her children.  This caused her considerable agitation and anxiety.  This visit was prompted by the fact that, several days prior, she had had a friend at the home that had become involved in substance abuse and had had a behavioral outburst and the police had been notified, prompting protective services visit to the home.  This friend, who was abusing substances and acting out, was a former fellow patient that the patient had taken home with her.  The patient reports that her sleep has been satisfactory.  She reports no suicidal ideation or homicidal ideation.  Denies any dangerous ideations but does report that some visual hallucinations tend to persist and that she still continues to see shadowy figures, which are not clear enough to  distinguish.  She denies any auditory hallucinations.  She reports that she remains clean off alcohol and has been clean for the last 15 months.  However, her urine drug screen showed positive for cannabis and she reports she has been smoking marijuana.  Denies any other substance abuse.  PAST PSYCHIATRIC HISTORY:  The patient is followed at [facility] by Dr. Yetta Barre.  This is her fourth admission to [facility] with her previous one being on April 18, 2001 through April 25, 2001.  The patient also has a history of prior admissions to [facility] for alcohol and substance abuse and to [place] and to [facility].  The patient also has a history of at least two prior suicide attempts.  She has a history of ETOH abuse and has been clean for 15 months.  SOCIAL HISTORY:  The patient has been separated from her husband for the past two years.  She has three children, ages 57, 3 and 6 months.  She completed the 11th grade.  She is not currently working.  She currently lives in a Section 8 housing with her three children and now has a friend, who is also a former patient here, named Victorino Dike, who is staying with her and is supporting her and her alcohol detox efforts.  Her father assists her with extra funds for cigarettes and other household expenses.  FAMILY HISTORY:  The patient denies any history of mental illness or substance abuse.  ALCOHOL/DRUG HISTORY:  As previously noted, patient has been sober from alcohol for 15 months with heavy use in the past.  Her urine drug screen was positive for [facility] and she admits to using this several times with her last use being three days ago.  She denies any other substance abuse.  MEDICAL HISTORY:  The patient has a primary care Naomii Kreger in [place], who she sees very irregularly and she cannot remember the doctors name.  She reports the only medical problem that she  has is temporomandibular joint dysfunction, which causes her occasional pain.  No pain at this time.  MEDICATIONS:  On admission, Dictated by:   Young Berry. Scott, N.P. Attending Physician:  Jeanice Lim DD:  05/02/01 TD:  05/02/01 Job: [id number]

## 2010-09-04 NOTE — H&P (Signed)
[facility]  Patient:    Diane Pacheco, Diane Pacheco Visit Number: [id number] MRN: [id number]          Service Type: PSY Location: 300 0300 02 Attending Physician:  Jeanice Lim Dictated by:   Candi Leash. Orsini, N.P. Admit Date:  04/18/2001                     Psychiatric Admission Assessment  IDENTIFYING INFORMATION:  A 40 year old separated white female, voluntarily admitted on April 18, 2001, for depression and suicidal ideation.  HISTORY OF PRESENT ILLNESS:  The patient presents with a history of depression and mood swings, has been unable to concentrate, has been isolating herself in bed, being very irritable and afraid to go in public spaces.  The patient identifies no specific stressors, having suicidal ideation but no intent, and was advised to come by her father.  The patient has a history of anxiety for years, panic attacks up to 5 per week, has been sleeping poorly.  Appetite has been satisfactory.  Denies any psychotic symptoms.  The patient reports compliance with her medications.  Denies any current suicidal or homicidal ideation.  PAST PSYCHIATRIC HISTORY:  Third visit to [facility], has been at [facility] in the past, [place] and [facility] for depression.  The patient has a history of biting and scratching herself.  Last time the patient has done these destructive activities has been in April 2002. The patient has a history of suicide attempts where she overdosed twice, last time in August of 2001.  The patient sees Lysle Dingwall on an outpatient basis at [facility], and sees Dr. Yetta Barre, her psychiatrist.  Last visit was in December 2002.  SOCIAL HISTORY:  She is a 40 year old separated white female, separated for 2 years, has 3 children, ages 8, 3 and 6 months.  The children are with patients father.  The patient is unemployed.  The patient completed the 11th grade, no legal problems.   She lives with her children.  FAMILY HISTORY:  None.  ALCOHOL DRUG HISTORY:  The patient smokes.  She has had a heavy alcohol use in the past, has been sober for 15 months, occasional THC.  PAST MEDICAL HISTORY:  Primary care Imanie Darrow is in [place], uncertain of the doctors name.  Medical problems are TMJ.  MEDICATIONS:  Seroquel 100 mg q.i.d., has been on that for 1 year, Klonopin 0.5 mg b.i.d. and 2 at q.h.s., Effexor XR 75 mg t.i.d., Restoril 15 mg at h.s. prescribed by Dr. Yetta [facility]re at [facility].  DRUG ALLERGIES:  No known allergies.  PHYSICAL EXAMINATION  REVIEW OF SYSTEMS:  Patient denies any fever or chills, had had a 25 pound weight loss although recently has had a baby and feels it is due to the pregnancy.  No blurred or double vision.  Patient wears glasses, has not had a recent eye exam.  No hearing loss, TMJ, no sinus pain.  Patient reports a partial plate.  No chest pain or palpitations.  Some heart racing with panic attacks.  No cough or shortness of breath.  Patient smokes.  No heartburn, constipation or diarrhea.  GU:  No dysuria, frequency, or hematuria.  History of tubal.  Sexually active.  MUSCULOSKELETAL:  No stiffness or swelling or joint pain, history of TMJ. No muscle aches or fractures.  SKIN:  No rashes, wounds, some dryness to her hands.  NEUROLOGIC:  No weakness, numbness on tingling, no headaches.  PSYCHIATRIC:  Patient with depression with a past suicide attempt.  ENDOCRINE:  No thyroid or diabetic problems.  LYMPHATIC:  No anemia or enlarged nodes.   ALLERGIES:  No environmental allergies.  GENERAL:  Patient is approximately 5 feet 4 inches tall.  She is 138 pounds.  LAST VITAL SIGNS:  98.8, 104, 20 respirations, blood pressure 105/13.  GENERAL APPEARANCE:  The patient is a 40 year old Caucasian female in no acute distress.  She is well-developed.  Appears her stated age.  The patient is fairly groomed, alert and  cooperative.  HEAD:  Normocephalic, she can raise his eyebrows.  Her hair is bleached, short, equally distributed.  EYES:  Pupils are equal and reactive to light. EOMs are intact bilaterally.  Her external ear canals are patent.  Her TMs are intact with some mild cerumen present.  Hearing is appropriate to conversation. No sinus tenderness, no nasal discharge.  Mucosa is moist.  Poor dentition.  Several broken teeth with a fair amount of plaque.  Tongue protrudes midline without tremor.  She  can clench her teeth and puff out her cheeks.  Uvula is midline.  Pharynx mildly red.  NECK:  Supple, no JVD, negative lymphadenopathy.  Thyroid is nonpalpable and nontender.  Trachea is midline.  CHEST:  Clear to auscultation.  No adventitious sounds.  No cough.  HEART:  Regular rate and rhythm, without murmurs, gallops or rubs.  Carotid pulses are equal and adequate.  No edema was noted.  BREAST EXAM:  Deferred.  ABDOMEN:  Soft, nontender abdomen.  No CVA tenderness.  MUSCULOSKELETAL:  No joint swelling or deformity.  Good range of motion. Muscle strength and tone is equal bilaterally.  SKIN:  Warm and dry with some dryness noted to hands.  Good turgor.  Nail beds are pink with good capillary refill.  No clubbing. Nail beds are short and clean.  NEUROLOGIC:  She is oriented x 3.  Cranial nerves are grossly intact.  Good grip strength bilaterally.  No involuntary movements.  Gait is normal. Cerebellar function is intact, with heel-to-shin, normal alternating movements.  Romberg is negative.  HEALTH MAINTENANCE ISSUES:  Addressed.  MENTAL STATUS EXAMINATION:  She is an alert, young middle-aged, overweight, unkempt, cooperative Caucasian female.  Speech is normal and relevant.  Mood is depressed and anxious.  Affect is depressed and crying, irritable and anxious.  Thought processes are coherent.  No evidence of psychosis.  No auditory or visual hallucinations.  No suicidal or homicidal  ideation.  No paranoia.  Cognitive function is intact.  Memory is good, judgment is fair, insight is fair.  ADMISSION DIAGNOSES:  Axis I:    Major depression, severe. Axis II:   Deferred. Axis III:  None. Axis IV:   Problems with primary support group. Axis V:    Current 30, estimated this past year 60-65.  INITIAL PLAN OF CARE:  Voluntary admission to [facility] for depression and suicidal ideation and anxiety.  Contract for safety, check every 15 minutes.  Will resume her routine medications, will obtain labs. Will encourage group activities.  Will consider a mood stabilizer.  Treatment plan was discussed with Dr. Kathrynn Running.  Our goal is to stabilize mood and thinking so patient can be safe, to increase coping skills, to follow up with her therapist and psychiatrist at [facility].  TENTATIVE LENGTH OF STAY:  3 to 5 days. Dictated by:   Candi Leash. Orsini, N.P. Attending Physician:  Jeanice Lim DD:  04/20/01 TD:  04/20/01 Job:  [id number]

## 2010-09-04 NOTE — Discharge Summary (Signed)
[facility]  Patient:    Diane Pacheco, Diane Pacheco                  MRN: [id number] Adm. Date:  [id number] Disc. Date: 11/07/99 Attending:  Marlyn Corporal Fabmy                           Discharge Summary  ADMISSION DIAGNOSIS: Axis I:    1. Anxiety disorder not otherwise specified.            2. Major depression. Axis II:   Deferred. Axis III:  Temporomandibular joint and status post overdose on            October 20, 1999. Axis IV:   Severe, psychosocial. Axis V:    Global assessment of function on admission 35, on discharge 70.  DISCHARGE DIAGNOSIS: Axis I:    1. Anxiety disorder not otherwise specified.            2. Major depressive disorder. Axis II:   Personality disorder not otherwise specified. Axis III:  Status post overdose on October 20, 1999. Axis IV:   Severe, psychosocial. Axis V:    Global assessment of function on admission 35, on discharge 70.  BRIEF HISTORY:  The patient is a 40 year old, twice separated, Caucasian female, who overdosed on Klonopin, Zoloft and Restoril and was stabilized at [facility]  She also has a history of alcohol dependency.  She was inebriated when she took the overdose.  She was extremely shaky, flapping around her arms and whole body, and experiencing significant feelings of anxiety and depression.  She was also complaining of insomnia, recurrent panic attacks, and almost unbearable anxiety.  The patient has had a history of panic attacks and was actually at ______ hospital last year for management of her anxiety and she was stabilized on Zoloft and Klonopin.  She has been tried on BuSpar and Celexa and Paxil without any positive benefit.  PERTINENT PHYSICAL AND LABORATORY DATA:  Attempts to stabilize her failed and eventually I asked for a neurological evaluation.  The patient was evaluated by Dr. Marcy Salvo who ordered an MRI.  The MRI was entirely negative.  Patient was confronted with the results of the  MRI and confronted with her secondary gain, with benefit.  Otherwise, laboratory data included urinalysis which was unremarkable.  A urine drug screen was positive for cannabinoids and benzodiazepines.  Liver profile was unremarkable.  CBC was normal.  Blood gasses were normal.  Thyroid profile was unremarkable.  HOSPITAL COURSE:  As noted, the patient was initially tremulous and shaky with flapping of her arms and entire body.  She actually received Motrin because of soreness in her muscles resulting from  these movements.  Patient was initially placed on Restoril 50 mg h.s. and Klonopin, but the Klonopin was then discontinued.  She had overdosed on it and it was not effective.  Zoloft was also discontinued and she was started of Effexor XR 75 mg daily, and Seroquel at increasing doses.  By the time of her discharge, the dosage of Seroquel had been increased to 150 mg t.i.d.  She was tried on Vistaril without effect.  Neurontin 300 mg q.i.d. was added to her medication with some benefit.  We used Trazodone 150 mg h.s. for sleep and in spite of all this medication, the patient still woke up two or three times every night.  CONDITION ON DISCHARGE:  At this point in time,  the patient has a tendency of fidget, but her course of treatment has resolved and she reports improvement in affect.  She has broken up with her boyfriend, who is an alcoholic.  She plans to attend AA meetings faithfully.  She denies suicidal or homicidal intent.  Her affect is full and her thinking is tight and goal directed.  She has regained her sense of humor.  DISCHARGE MEDICATIONS:  She was discharged with prescriptions for: 1. Seroquel 100 mg t.i.d. 2. Seroquel 25 mg two t.i.d. 3. Neurontin 300 mg q.i.d. 4. Effexor XR 75 mg q.d. 5. Restoril 30 mg h.s.  She is alert and fully functional.  She was advised not to drink alcoholic beverages and not to get pregnant on this medication.  She is to follow up at the  mental health center at [facility]. DD:  11/06/99 TD:  11/08/99 Job: [id number]

## 2010-09-04 NOTE — Consult Note (Signed)
[facility]  Patient:    Diane Pacheco, Diane Pacheco                  MRN: [id number] Proc. Date: 11/03/99 Adm. Date:  [id number] Attending:  Beryle Beams CC:         Dub Amis, M.D.                          Consultation Report  NEUROLOGY CONSULTATION  CHIEF COMPLAINT:  Shaking.  HISTORY OF PRESENT ILLNESS:  Ms. Beining is a 40 year old right-handed, white woman admitted after an intentional overdose of Klonopin, Restoril and Zoloft.  Since her acute admission and detox while she has been here at [facility] she has developed coarse tremors.  I am asked to assess this.  Patient says she has no prior history of tremors.  She had only been on Klonopin for four days without a prior history of benzodiazepine exposure.  No family history of tremors.  Patient reportedly took an overdose of 115 Klonopin tablets and 75 Restoril tablets as well as an undisclosed number of Zoloft tablets, in the presence of her boyfriend.  REVIEW OF SYSTEMS:  Positive for anxiety and tremors.  PAST MEDICAL HISTORY:  Significant for TMJ, anxiety and depression.  MEDICATIONS:  She is currently on Librium, Motrin, Neurontin, Seroquel, Effexor, Ativan, Trazodone, Cogentin, Tylenol and Mylanta.  She had been on Vistaril and Inderal without any effect on the tremor.  DRUG ALLERGIES:  No known drug allergies.  SOCIAL HISTORY:  She lived with her boyfriend.  Positive alcohol and cigarette use.  FAMILY HISTORY:  Negative for movement disorder.  PHYSICAL EXAMINATION:  VITAL SIGNS:  Temperature is 98.0, pulse is 100, respirations 20, blood pressure is 94/65.  HEAD:  Normocephalic, atraumatic, poor dentition.  NECK:  Supple without bruits.  HEART:  Regular rate and rhythm.  LUNGS:  Clear to auscultation.  EXTREMITIES:  Without edema.  NEUROLOGIC EXAM:  Mental status:  She is awake and alert.  She is oriented, has normal language.  She is  cooperative.  Cranial nerves:  Pupils are equal, round and reactive, extraocular movements are intact.  Visual fields are full to confrontation.  Facial sensation is normal.  Facial motor activity is normal.  Hearing is intact.  Palate symmetric without any clonus or myoclonus. Tongue is midline.  Motor exam:  She actually has normal bulk, tone and strength throughout.  No cogwheeling is noted.  She has an intermittent balancing and jiggling movement of her legs greater than arms, none in the head.  These seem to be present at rest at rest when there is slight tension on the limb and disappears with action as well as with distraction.  It doesnt look physiologic.  Her tone is normal, without cogwheeling or JV as mentioned.  There is no head tremor.  Reflexes are 2+ without pathologic changes.  She has downgoing toes bilaterally.  Finger to nose, heel to shin, tandem gait are normal.  She can heel walk and toe walk.  Normal sensory exam.  IMPRESSION:  Tremor.  It does not look like a typical involuntary movement disorder such as one might see with extrapyramidal symptoms, Parkinsons disease, benign essential tremor, withdrawal syndrome, or with a classical basal ganglia lesion.  Certainly  could be functional or perhaps related to anxiety, nervousness, like a habit.  It is definitely distractible.  RECOMMENDATION:  Would screen with MRI of the brain to  rule out basal ganglia pathology, but doubt we will find that.  Would not use specific medications, but just encourage patient that this will clear up spontaneously over time. DD:  11/03/99 TD:  11/04/99 Job: [id number]

## 2010-09-04 NOTE — H&P (Signed)
[facility]  Patient:    Diane Pacheco, Diane Pacheco                  MRN: [id number] Adm. Date:  [id number] Disc. Date: [id number] Attending:  Marlyn Corporal Fabmy Dictator:   Candi Leash. Theressa Stamps, N.P.                   Psychiatric Admission Assessment  DATE OF ADMISSION:  January 23, 2000  PATIENT IDENTIFICATION:  This is a 40 year old white female voluntarily admitted to [facility] on January 23, 2000 for depression and suicidal ideation.  HISTORY OF PRESENT ILLNESS:  The patient reports she has been feeling rather depressed for the past two weeks with crying and feelings of hopelessness for the past five days.  This has seemed to be somewhat associated with problems with her boyfriend.  She had thoughts of suicide with a plan to either overdose, cut her wrists, or jump off a bridge; just something so her children would not see.  She says she has been sleeping okay but she has been waking up frequently.  Her appetite has been poor.  She reports no auditory or visual hallucinations, no paranoia, no homicidal ideation.  She has a history of anxiety attacks.  She has a history of biting and scratching her hands.  PAST PSYCHIATRIC HISTORY:  She has had an admission to [facility] in June 2001.  She has been at [facility] x 2 and at [facility].  She had a recent admission to [facility] in September 2001 for anxiety.  SUBSTANCE ABUSE HISTORY:  She states she is a binge drinker; last episode like that was last September.  She tries to escape from her father when she does that.  She has an occasional marijuana use.  PAST MEDICAL HISTORY:  Primary care Diane Pacheco: She goes to [facility].  Medical problems include TMJ.  Medications: Klonopin 1 mg, she takes three during the day and two at bedtime; Effexor 75 mg t.i.d.; trazodone 100 mg q.h.s.; Restoril 50 mg; Remeron, she says she takes 45 mg q.h.s.   Drug allergies: No known drug allergies.  SOCIAL HISTORY:  A 40 year old separated white female, has been separated for about a year; she has been married for two years.  She has two children, 6-year-old son, 55.49-year-old daughter.  She lives with her children.  She is unemployed.  She has an 11th grade education.  She smokes one pack a day since 40 years of age.  Family history: Brother with depression, sister has anxiety.  PHYSICAL EXAMINATION:  Physical examination was done at [facility].  MENTAL STATUS EXAMINATION:  She is an alert, cooperative, 40 year old white female lying in bed.  She is anxious, slightly irritable with questions.  She is casually dressed.  Speech is normal and relevant.  Mood is anxious.  Affect is anxious, somewhat shaky.  Thought processes are intact.  There is no evidence of psychosis.  Negative auditory or visual hallucinations, negative delusions, negative flight of ideas, negative homicidal ideation and no current suicidal ideation today.  Cognitive: Oriented x 3.  Memory is intact. Average intelligence.  ADMISSION DIAGNOSES: Axis I:    1. Depressive disorder.            2. General anxiety. Axis II:   Deferred. Axis III:  Temporomandibular joint syndrome. Axis IV:   Moderate with primary support group, occupation, and economic  problems. Axis V:    Current is 40, past year is 60.  INITIAL PLAN OF CARE:  Voluntary admission to [facility] for depressive disorder and generalized anxiety disorder.  Contract for safety. Check every 15 minutes.  Her routine medications will be initiated.  Seroquel was also added for insomnia a bedtime p.r.n. and Vistaril for anxiety.  The patient is encouraged to go to groups.  ESTIMATED LENGTH OF STAY:  Three to five days. DD:  01/24/00 TD:  01/24/00 Job: [id number]

## 2010-09-04 NOTE — Discharge Summary (Signed)
[facility]  Patient:    Diane Pacheco, Diane Pacheco Visit Number: [id number] MRN: [id number]          Service Type: PSY Location: [id number] [id number] Attending Physician:  Rachael Fee Dictated by:   Reymundo Poll Dub Mikes, M.D. Admit Date:  05/02/2001 Discharge Date: 05/03/2001                             Discharge Summary  CHIEF COMPLAINT AND HISTORY OF PRESENT ILLNESS:  This was the first admission to [facility] for this 40 year old female voluntarily admitted for depression and suicidal ideas.  History of depression, mood swings, unable to concentrate, isolating herself in bed, being very irritable and afraid to go into public spaces.  The patient identified no specific stressor.  Having suicidal ideas with no intent.  Was advised to come by her father.  History of anxiety for years, panic attacks up to five per week, sleeping poorly, appetite has been satisfactory, denies any psychotic symptoms.  Reports compliance with medications.  PAST PSYCHIATRIC HISTORY:  Has been at [facility] [place] for depression.  Has a history of biting and scratching herself.  SUBSTANCE ABUSE HISTORY:  Smokes.  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  Occasional use of marijuana.  MEDICATIONS ON ADMISSION: 1. Seroquel 100 mg four times a day for a year now. 2. Klonopin 0.5 mg twice a day and 2 mg at bedtime. 3. Effexor XR 75 mg three times a day. 4. Restoril 15 mg at bedtime.  MENTAL STATUS EXAMINATION ON ADMISSION:  Well-nourished, well-developed, alert, cooperative female who is unkempt.  Speech was normal and relevant. Mood was depressed and anxious.  Affect was depressed and crying, irritable, and anxious.  Thought processes: Coherent; no evidence of psychosis, no auditory or visual hallucinations, no suicidal or homicidal ideation, no paranoia.  Cognitive: Well preserved.  ADMITTING  DIAGNOSES: Axis I:    Major depression, recurrent. Axis II:   No diagnosis. Axis III:  No diagnosis. Axis IV:   Moderate. Axis V:    Global assessment of functioning upon admission 30, highest global            assessment of functioning in the last year 60-65.  HOSPITAL COURSE:  She was admitted and started in intensive individual and group psychotherapy.  Continued to experience depression as well as some disturbance with perceptions, seeing things in the corner of her eye, still feeling very anxious.  No particular trigger.  We went ahead and continued to work with the medication, increased the Effexor to 225 mg per day, Neurontin 300 mg three time a day, Seroquel 200 mg twice a day and 400 mg at bedtime. We went ahead and again increased the Effexor to 150 mg twice a day and the Neurontin to 600 mg three times a day.  Due to the persistent depression she was given Lexapro 5 mg every day and she was started on Risperdal 0.25 mg for the disturbance of perception.  On January 8 she had obtained full benefit from the hospitalization, felt like she was ready to be discharged.  Her mood had improved.  The images that she was seeing, she felt they were not real; she just had to tell herself that they were not.  Would like to continue to work on an outpatient basis.  As she was not suicidal or  homicidal and willing to work on outpatient treatment, we went ahead and discharged her to outpatient followup.  DISCHARGE DIAGNOSES: Axis I:    1. Major depression, recurrent.            2. Rule out psychotic features.            3. Anxiety disorder, not otherwise specified. Axis II:   No diagnosis. Axis III:  No diagnosis. Axis IV:   Moderate. Axis V:    Global assessment of functioning upon discharge 55-60.  DISCHARGE MEDICATIONS:  1. Klonopin 0.5 mg three times a day.  2. Klonopin 1 mg at bedtime.  3. Restoril 30 mg at bedtime.  4. Seroquel 200 mg two at bedtime.  5. Effexor XR 150 mg  twice a day.  6. Neurontin 600 mg one three times a day.  7. Risperdal 0.25 mg one twice a day.  8. Lexapro 10 mg half daily.  9. Ambien 10 mg at bedtime for sleep. 10. Vistaril as needed for anxiety.  FOLLOWUP:  [facility]. Dictated by:   Reymundo Poll Dub Mikes, M.D. Attending Physician:  Rachael Fee DD:  06/07/01 TD:  06/07/01 Job: [id number]
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
## 2011-06-28 ENCOUNTER — Telehealth: Payer: Self-pay | Admitting: Family Medicine

## 2011-06-28 ENCOUNTER — Encounter: Payer: Self-pay | Admitting: Family Medicine

## 2011-06-28 ENCOUNTER — Ambulatory Visit (INDEPENDENT_AMBULATORY_CARE_PROVIDER_SITE_OTHER): Payer: Medicare Other | Admitting: Family Medicine

## 2011-06-28 DIAGNOSIS — L600 Ingrowing nail: Secondary | ICD-10-CM | POA: Diagnosis not present

## 2011-06-28 DIAGNOSIS — J45901 Unspecified asthma with (acute) exacerbation: Secondary | ICD-10-CM | POA: Diagnosis not present

## 2011-06-28 MED ORDER — MINOCYCLINE HCL 100 MG PO CAPS: 100.0000 mg | ORAL_CAPSULE | Freq: Two times a day (BID) | ORAL | Status: DC

## 2011-06-28 MED ORDER — PREDNISONE 50 MG PO TABS: ORAL_TABLET | ORAL | Status: AC

## 2011-06-28 MED ORDER — GUAIFENESIN-CODEINE 100-10 MG/5ML PO SYRP: 5.0000 mL | ORAL_SOLUTION | Freq: Three times a day (TID) | ORAL | Status: DC | PRN

## 2011-06-28 MED ORDER — GUAIFENESIN-CODEINE 100-10 MG/5ML PO SYRP: 5.0000 mL | ORAL_SOLUTION | Freq: Three times a day (TID) | ORAL | Status: AC | PRN

## 2011-06-28 NOTE — Progress Notes (Signed)
  Subjective:    Patient ID: Diane Pacheco, female    DOB: 23-Jun-1970, 41 y.o.   MRN: [id number]  HPI #1. Asthma exacerbation: Patient had URI which again a bit over 3 weeks ago. She had sinus congestion and drainage. This became an asthma exacerbation which she states is the usual course of her illnesses. She has been having difficulty with more cough and wheezing for the past 2-3 weeks. She states she is actually got the night the cough when she wakes to urinate she will begin coughing after that. No fevers or chills. No nausea or vomiting. She is using her albuterol inhaler to 3 times a day with intermittent relief.  #2. Ingrown toenail: Patient is an ingrown toenail that is worsening for the past 2-3 weeks. She states that she had an appointment for her complete physical exam but was told that she would have to come back a third time to have general removed. She states it is painful and red. She has not noticed any drainage. No fevers or chills.  The following portions of the patient's history were reviewed and updated as appropriate: allergies, current medications, past medical history, family and social history, and problem list.  Patient is a nonsmoker.   Review of Systems See HPI above for review of systems.       Objective:   Physical Exam Gen:  Alert, cooperative patient who appears stated age in no acute distress.  Vital signs reviewed. HEENT:  [place]/AT.  EOMI, PERRL. nasal turbinates erythematous and enlarged bilaterally. MMM, tonsils non-erythematous, non-edematous.  Posterior oropharynx is erythematous. External ears WNL, Bilateral TM's normal without retraction, redness or bulging.  Cardiac:  Regular rate and rhythm without murmur auscultated.  Good S1/S2. Lungs:  Diffuse wheezing throughout all lung fields. Extremities no lower extremity swelling noted. Left toe does have ingrown medial toenail. It is erythematous. It does not actually appear infected. Unable to drain or  express any purulence.       Assessment & Plan:

## 2011-06-28 NOTE — Patient Instructions (Signed)
Keep using her inhaler as needed. You can use this every 4-6 hours if you need to. I'm going to send an antibiotic and steroid for you. Take the antibiotic minocycline twice daily with food for 7 days. Take the prednisone one pill a day for the next 5 days. The cough syrup you can take every 8 hours if you need to be I will also send in the referral for podiatry.

## 2011-06-28 NOTE — Progress Notes (Signed)
Addended by: [place] Cellar on: 06/28/2011 03:38 PM   Modules accepted: Orders

## 2011-06-28 NOTE — Assessment & Plan Note (Signed)
Treatment prednisone and minocycline. I would believe patient is on her way to COPD. Therefore we are treating this more like a COPD exacerbation. She is a long-term smoker.

## 2011-06-28 NOTE — Telephone Encounter (Signed)
error 

## 2011-06-28 NOTE — Assessment & Plan Note (Signed)
Plan to refer to podiatry. No signs of infection today. Counseled to cut toenails straight across the

## 2011-07-02 ENCOUNTER — Telehealth: Payer: Self-pay | Admitting: *Deleted

## 2011-07-05 ENCOUNTER — Telehealth: Payer: Self-pay | Admitting: Family Medicine

## 2011-07-05 NOTE — Telephone Encounter (Signed)
error 

## 2011-07-06 NOTE — Telephone Encounter (Signed)
Referral done by D. Loring.Diane Pacheco [place]

## 2011-07-14 DIAGNOSIS — L600 Ingrowing nail: Secondary | ICD-10-CM | POA: Diagnosis not present

## 2011-07-15 ENCOUNTER — Other Ambulatory Visit (HOSPITAL_COMMUNITY)
Admission: RE | Admit: 2011-07-15 | Discharge: 2011-07-15 | Disposition: A | Discharge status: Home / Self Care | Payer: Medicare Other | Source: Ambulatory Visit | Attending: Family Medicine | Admitting: Family Medicine

## 2011-07-15 ENCOUNTER — Ambulatory Visit (INDEPENDENT_AMBULATORY_CARE_PROVIDER_SITE_OTHER): Payer: Medicare Other | Admitting: Family Medicine

## 2011-07-15 ENCOUNTER — Encounter: Payer: Self-pay | Admitting: Family Medicine

## 2011-07-15 VITALS — BP 130/87 | HR 94 | Temp 98.5°F | Ht 64.0 in | Wt 173.4 lb

## 2011-07-15 DIAGNOSIS — Z124 Encounter for screening for malignant neoplasm of cervix: Secondary | ICD-10-CM | POA: Insufficient documentation

## 2011-07-15 DIAGNOSIS — E669 Obesity, unspecified: Secondary | ICD-10-CM | POA: Diagnosis not present

## 2011-07-15 DIAGNOSIS — F17200 Nicotine dependence, unspecified, uncomplicated: Secondary | ICD-10-CM

## 2011-07-15 NOTE — Progress Notes (Signed)
  Subjective:    Patient ID: [name], female    DOB: 1970-06-09, 41 y.o.   MRN: [id number]  HPI CC: PAP and obesity  PAP: last pap 2009 normal. No new sexual partners. H/o condyloma acuminatum. Denies vaginal DC, irritation. Not on period today  Obesity: Complains of weight gain over past 91mo. Poor diet. Little exercise. Suffers from depression. Primary caregiver for disabled husband.   Tobacco: continues to smoke 1/2 ppd. Understands risks associated w/ behavior. Husband smokes as well. Unwilling to try to quit now due to life stressors. Pt states she isn't able to take any medications for her depression b/c they make her feel poorly. When she tries to quit smoking, her depression gets worse, so she is unwilling to quit at this time.   Review of Systems Negative: CP, SOB, n/v/d/c, hematochezia, hematemesis, hematuria, dysuria, dizziness, syncope, vision change, change in sensation, HA    Objective:   Physical Exam  CV: RRR, no m/r/g Res: CTAB, normal effort, no wheezes LE: no edema, normal pulses HEENT: No cervical lymphadenopathy, oropharynx normal, dentures in place.       Assessment & Plan:

## 2011-07-15 NOTE — Patient Instructions (Signed)
Thank you for coming into clinic today. I will call you if any of your lab work comes back abnormal. Please follow up with me as needed for any other problems. Have a great weekend

## 2011-07-15 NOTE — Assessment & Plan Note (Addendum)
BMI 30 today. Wt gain of 6lb over past few wks., likely due to poor diet, lack of exercise. ROS unremarkable. Pt encouraged to exercise, discussed healthy eating habits. CMET, HgbA1c, and Direct LDL today. If continues to gain will refer nutrition.

## 2011-07-16 LAB — COMPREHENSIVE METABOLIC PANEL
ALT: 12 U/L (ref 0–35)
AST: 27 U/L (ref 0–37)
Alkaline Phosphatase: 72 U/L (ref 39–117)
Creat: 0.8 mg/dL (ref 0.50–1.10)
Sodium: 138 mEq/L (ref 135–145)
Total Bilirubin: 0.3 mg/dL (ref 0.3–1.2)
Total Protein: 6.7 g/dL (ref 6.0–8.3)

## 2011-07-16 LAB — LDL CHOLESTEROL, DIRECT: Direct LDL: 110 mg/dL — ABNORMAL HIGH

## 2011-07-19 ENCOUNTER — Encounter: Payer: Self-pay | Admitting: Family Medicine

## 2011-07-19 DIAGNOSIS — Z124 Encounter for screening for malignant neoplasm of cervix: Secondary | ICD-10-CM | POA: Insufficient documentation

## 2011-07-19 NOTE — Assessment & Plan Note (Signed)
Not ready to quit at this time. Has tried welbutrin in the past. Will consider chantix in the future. Will address again at next appt. Pt aware of health risks associated w/ smoking.

## 2011-07-19 NOTE — Assessment & Plan Note (Signed)
Will call pt w/ results if abnormal. Repeat in 3 yrs. Pt w/o previous abnormal pap per pt.

## 2011-08-23 ENCOUNTER — Other Ambulatory Visit: Payer: Self-pay | Admitting: Family Medicine

## 2011-08-23 MED ORDER — ALBUTEROL SULFATE HFA 108 (90 BASE) MCG/ACT IN AERS: 2.0000 | INHALATION_SPRAY | RESPIRATORY_TRACT | Status: DC | PRN

## 2011-10-28 ENCOUNTER — Ambulatory Visit
Admission: RE | Admit: 2011-10-28 | Discharge: 2011-10-28 | Disposition: A | Discharge status: Home / Self Care | Payer: Medicare Other | Source: Ambulatory Visit | Attending: Family Medicine | Admitting: Family Medicine

## 2011-10-28 ENCOUNTER — Ambulatory Visit (INDEPENDENT_AMBULATORY_CARE_PROVIDER_SITE_OTHER): Payer: Medicare Other | Admitting: Family Medicine

## 2011-10-28 ENCOUNTER — Encounter: Payer: Self-pay | Admitting: Family Medicine

## 2011-10-28 VITALS — BP 133/94 | HR 120 | Temp 98.1°F | Ht 64.0 in | Wt 162.7 lb

## 2011-10-28 DIAGNOSIS — J40 Bronchitis, not specified as acute or chronic: Secondary | ICD-10-CM | POA: Insufficient documentation

## 2011-10-28 DIAGNOSIS — R05 Cough: Secondary | ICD-10-CM

## 2011-10-28 DIAGNOSIS — R509 Fever, unspecified: Secondary | ICD-10-CM | POA: Diagnosis not present

## 2011-10-28 DIAGNOSIS — R0989 Other specified symptoms and signs involving the circulatory and respiratory systems: Secondary | ICD-10-CM | POA: Diagnosis not present

## 2011-10-28 MED ORDER — IPRATROPIUM BROMIDE 0.02 % IN SOLN
0.5000 mg | Freq: Once | RESPIRATORY_TRACT | Status: AC
  Administered 2011-10-28: 0.5 mg via RESPIRATORY_TRACT

## 2011-10-28 MED ORDER — DOXYCYCLINE HYCLATE 100 MG PO TABS: 100.0000 mg | ORAL_TABLET | Freq: Two times a day (BID) | ORAL | Status: AC

## 2011-10-28 MED ORDER — PREDNISONE 50 MG PO TABS: ORAL_TABLET | ORAL | Status: AC

## 2011-10-28 MED ORDER — ALBUTEROL SULFATE HFA 108 (90 BASE) MCG/ACT IN AERS: 2.0000 | INHALATION_SPRAY | RESPIRATORY_TRACT | Status: DC | PRN

## 2011-10-28 MED ORDER — ALBUTEROL SULFATE (2.5 MG/3ML) 0.083% IN NEBU
2.5000 mg | INHALATION_SOLUTION | Freq: Once | RESPIRATORY_TRACT | Status: AC
  Administered 2011-10-28: 2.5 mg via RESPIRATORY_TRACT

## 2011-10-28 NOTE — Progress Notes (Signed)
  Subjective:    Patient ID: Diane Pacheco, female    DOB: 12-Sep-1970, 41 y.o.   MRN: [id number]  HPI 3-4 days of fever, chills, cough  Feels tired.  Not using any inhalers or medications.  Some dyspnea.  Has been coughing very hard- notes pleuritic chest pain, in upper chest and in her abdomen.  Also with diarrhea.    I have reviewed patient's  PMH, FH, and Social history and Medications as related to this visit. History of smoking.  Hhistory of asthma per patient   Review of Systems See HPI    Objective:   Physical Exam  GEN: Alert & Oriented, No acute distress, tired appearing HEENT: [place]/AT. EOMI, PERRLA, no conjunctival injection or scleral icterus.  Bilateral tympanic membranes intact without erythema or effusion.  Nares without edema or rhinorrhea.  Oropharynx is without erythema or exudates.  No anterior or posterior cervical lymphadenopathy. CV:  Regular Rate & Rhythm, no murmur Respiratory:  Increased work of breathing, diffuse wheezes throughout.  Wheezes resolved after neb treatment Abd:  + BS, soft, no tenderness to palpation Ext: no pre-tibial edema      Assessment & Plan:

## 2011-10-28 NOTE — Progress Notes (Signed)
Subjective:     Patient ID: [name], female   DOB: [birth year], 41 y.o.   MRN: [id number]  HPI Ms. Alexie is a 41 y/o otherwise healthy female who comes to clinic today with a complaint of a new cough.   She says the cough started about 4 days ago. It has become so bad recently that it hurts her chest when she coughs, and she is concerned she may have broken a rib. She says the pain is generally in the center of her chest, and also in the epigastric region. She says the coughing is often worse at night, especially when she lays down. She has been using 3 to 4 pillows to sleep a night, which is more than she generally uses.  She says she has felt short of breath since the coughing started, and had to use her sons albuterol inhaler yesterday.   She does report fever and chills, often feeling very sweaty after a fit of coughing.   She also reports the she has been experiencing about 6 episodes of watery diarrhea a day since the coughing started.   Denies any nausea or vomiting, dizziness, or lightheadedness. She does report recent travel to and from [place] by car, with about 10 hrs of driving each way.   Patient reports a history of asthma for which she normally uses an albuterol inhaler.   Review of Systems As per above.   Objective:   Physical Exam General: In no acute distress, patient appears uncomfortable HEENT: PERRLA, MMM, oropharynx is mildly erythematous. Neck is supple and without lymphadenopathy.  Pulm: Appears to have increased work of breathing. Breath sounds are present in all lungfields. There is wheezes heard bilaterally, especially in the upper lung fields. No rales, no ronchi.   CV: Tachycardic, no murmurs, rubs or gallops. No S3/S4.  Abd: Soft, non-tender Ext: No edema  Assessment:  41 y/o woman with a 4 day history of cough, chills, diarrhea, and now shortness of breath. Given the wheezes heard on exam and the low O2 sat, this likely represents an  infectious exacerbation of her asthma. Pneumonia is a possibility, but the absence or rales or consolidation on exam make this less likely. There is some concern for PE given the recent long car travel, but this is low probability by Wells criteria (Score of 1.5).   Plan:  1. Cough: Patient had nebulizer administered in the office, with resolution of wheezing on exam and increase in O2 sat to 97%. Prescribed a course of doxycycline to treat presumed infection. Ordered a chest x-ray to rule out pneumonia. Instructed to take tylenol for pain, and to follow up tomorrow to see how she is progressing. Also refilled prescription for patients albuterol inhaler.

## 2011-10-28 NOTE — Assessment & Plan Note (Addendum)
Rx for albuterol, docycycline, prednisone.  Suspect asthma exacerbation due to bronchitis, possibly now COPD.

## 2011-10-28 NOTE — Patient Instructions (Addendum)
Get chest xray  I will call you with results and will send in prescription for antibiotic

## 2011-12-26 ENCOUNTER — Emergency Department (HOSPITAL_COMMUNITY)
Admission: EM | Admit: 2011-12-26 | Discharge: 2011-12-26 | Disposition: A | Discharge status: Home / Self Care | Payer: Medicare Other | Source: Home / Self Care | Attending: Emergency Medicine | Admitting: Emergency Medicine

## 2011-12-26 ENCOUNTER — Encounter (HOSPITAL_COMMUNITY): Payer: Self-pay | Admitting: Emergency Medicine

## 2011-12-26 DIAGNOSIS — H6691 Otitis media, unspecified, right ear: Secondary | ICD-10-CM

## 2011-12-26 DIAGNOSIS — J45909 Unspecified asthma, uncomplicated: Secondary | ICD-10-CM | POA: Diagnosis not present

## 2011-12-26 MED ORDER — ALBUTEROL SULFATE HFA 108 (90 BASE) MCG/ACT IN AERS: 1.0000 | INHALATION_SPRAY | Freq: Four times a day (QID) | RESPIRATORY_TRACT | Status: DC | PRN

## 2011-12-26 MED ORDER — AMOXICILLIN 500 MG PO CAPS: 1000.0000 mg | ORAL_CAPSULE | Freq: Three times a day (TID) | ORAL | Status: AC

## 2011-12-26 MED ORDER — METHYLPREDNISOLONE SODIUM SUCC 125 MG IJ SOLR
INTRAMUSCULAR | Status: AC
  Filled 2011-12-26: qty 2

## 2011-12-26 MED ORDER — ALBUTEROL SULFATE (5 MG/ML) 0.5% IN NEBU
INHALATION_SOLUTION | RESPIRATORY_TRACT | Status: AC
  Filled 2011-12-26: qty 0.5

## 2011-12-26 MED ORDER — METHYLPREDNISOLONE SODIUM SUCC 125 MG IJ SOLR
125.0000 mg | Freq: Once | INTRAMUSCULAR | Status: AC
  Administered 2011-12-26: 125 mg via INTRAMUSCULAR

## 2011-12-26 MED ORDER — PREDNISONE 5 MG PO KIT: 1.0000 | PACK | Freq: Every day | ORAL | Status: DC

## 2011-12-26 MED ORDER — IPRATROPIUM BROMIDE 0.02 % IN SOLN
0.5000 mg | Freq: Once | RESPIRATORY_TRACT | Status: AC
Start: 2011-12-26 — End: 2011-12-26
  Administered 2011-12-26: 0.5 mg via RESPIRATORY_TRACT

## 2011-12-26 MED ORDER — ALBUTEROL SULFATE (5 MG/ML) 0.5% IN NEBU
5.0000 mg | INHALATION_SOLUTION | Freq: Once | RESPIRATORY_TRACT | Status: AC
Start: 2011-12-26 — End: 2011-12-26
  Administered 2011-12-26: 5 mg via RESPIRATORY_TRACT

## 2011-12-26 MED ORDER — BENZONATATE 200 MG PO CAPS: 200.0000 mg | ORAL_CAPSULE | Freq: Three times a day (TID) | ORAL | Status: AC | PRN

## 2011-12-26 NOTE — Discharge Instructions (Signed)

## 2011-12-26 NOTE — ED Provider Notes (Signed)
Chief Complaint  Patient presents with  . Cough    cough and congestion, hx of asthma    History of Present Illness:   Diane Pacheco is a 41 year old female who presents today with a four-day history of cough productive of white sputum, wheezing, chest tightness, chest pain, nasal congestion with no drainage, sinus pressure, ear pain, particularly on the right side, and sore throat. She denies any fever, chills, or GI complaints. She does smoke a half a pack of cigarettes per day. She has no definite history of asthma although she relates a history of "bronchitis" which has necessitated use of albuterol in the past.  Review of Systems:  Other than noted above, the patient denies any of the following symptoms. Systemic:  No fever, chills, sweats, fatigue, myalgias, headache, or anorexia. Eye:  No redness, pain or drainage. ENT:  No earache, ear congestion, nasal congestion, sneezing, rhinorrhea, sinus pressure, sinus pain, post nasal drip, or sore throat. Lungs:  No cough, sputum production, wheezing, shortness of breath, or chest pain. GI:  No abdominal pain, nausea, vomiting, or diarrhea.  PMFSH:  Past medical history, family history, social history, meds, and allergies were reviewed.  Physical Exam:   Vital signs:  BP 129/79  Pulse 98  Temp 98.3 F (36.8 C) (Oral)  Resp 18  SpO2 97%  LMP 12/25/2011 General:  Alert, in no distress. Eye:  No conjunctival injection or drainage. Lids were normal. ENT:  The right tympanic membrane was pink and dull. There was no pus or air-fluid level. The left tympanic membrane was normal.  Nasal mucosa was clear and uncongested, without drainage.  Mucous membranes were moist.  Pharynx was clear, without exudate or drainage.  There were no oral ulcerations or lesions. Neck:  Supple, no adenopathy, tenderness or mass. Lungs:  No respiratory distress.  She has bilateral, scattered expiratory wheezes, no rales or rhonchi, good expiratory flow and equal  breath sounds bilaterally. Heart:  Regular rhythm, without gallops, murmers or rubs. Skin:  Clear, warm, and dry, without rash or lesions.  Course in Urgent Care Center:   She was given a DuoNeb nebulizer treatment and Solu-Medrol 125 mg IM. She tolerated these well. Afterward she felt a lot better. Auscultation of her lungs still reveals some scattered wheezes, but the patient states she feels better and would like to be discharged to go home.  Assessment:  The primary encounter diagnosis was Asthmatic bronchitis. A diagnosis of Right otitis media was also pertinent to this visit.  Plan:   1.  The following meds were prescribed:   New Prescriptions   ALBUTEROL (PROVENTIL HFA;VENTOLIN HFA) 108 (90 BASE) MCG/ACT INHALER    Inhale 1-2 puffs into the lungs every 6 (six) hours as needed for wheezing.   AMOXICILLIN (AMOXIL) 500 MG CAPSULE    Take 2 capsules (1,000 mg total) by mouth 3 (three) times daily.   BENZONATATE (TESSALON) 200 MG CAPSULE    Take 1 capsule (200 mg total) by mouth 3 (three) times daily as needed for cough.   PREDNISONE 5 MG KIT    Take 1 kit (5 mg total) by mouth daily after breakfast. Prednisone 5 mg 6 day dosepack.  Take as directed.   2.  The patient was instructed in symptomatic care and handouts were given. 3.  The patient was told to return if becoming worse in any way, if no better in 3 or 4 days, and given some red flag symptoms that would indicate earlier return. The patient  was strongly encouraged to quit smoking.   Reuben Likes, MD 12/26/11 [phone]

## 2011-12-26 NOTE — ED Notes (Signed)
Pt c/o cough and congestion x several days has tried robutussin with no relief. Pt smokes half pk of cigs daily and marijuana 2 x daily.

## 2012-08-07 ENCOUNTER — Encounter (HOSPITAL_COMMUNITY): Payer: Self-pay

## 2012-08-07 ENCOUNTER — Emergency Department (INDEPENDENT_AMBULATORY_CARE_PROVIDER_SITE_OTHER): Payer: Medicare Other

## 2012-08-07 ENCOUNTER — Emergency Department (INDEPENDENT_AMBULATORY_CARE_PROVIDER_SITE_OTHER)
Admission: EM | Admit: 2012-08-07 | Discharge: 2012-08-07 | Disposition: A | Discharge status: Home / Self Care | Payer: Medicare Other | Source: Home / Self Care | Attending: Emergency Medicine | Admitting: Emergency Medicine

## 2012-08-07 DIAGNOSIS — J45909 Unspecified asthma, uncomplicated: Secondary | ICD-10-CM

## 2012-08-07 DIAGNOSIS — R05 Cough: Secondary | ICD-10-CM | POA: Diagnosis not present

## 2012-08-07 DIAGNOSIS — R509 Fever, unspecified: Secondary | ICD-10-CM | POA: Diagnosis not present

## 2012-08-07 MED ORDER — ALBUTEROL SULFATE (5 MG/ML) 0.5% IN NEBU
INHALATION_SOLUTION | RESPIRATORY_TRACT | Status: AC
  Filled 2012-08-07: qty 1

## 2012-08-07 MED ORDER — PREDNISONE 20 MG PO TABS
60.0000 mg | ORAL_TABLET | Freq: Once | ORAL | Status: AC
  Administered 2012-08-07: 60 mg via ORAL

## 2012-08-07 MED ORDER — PREDNISONE 20 MG PO TABS: 20.0000 mg | ORAL_TABLET | Freq: Two times a day (BID) | ORAL | Status: DC

## 2012-08-07 MED ORDER — AMOXICILLIN 500 MG PO CAPS: 1000.0000 mg | ORAL_CAPSULE | Freq: Three times a day (TID) | ORAL | Status: DC

## 2012-08-07 MED ORDER — TRAMADOL HCL 50 MG PO TABS: 100.0000 mg | ORAL_TABLET | Freq: Three times a day (TID) | ORAL | Status: DC | PRN

## 2012-08-07 MED ORDER — ALBUTEROL SULFATE HFA 108 (90 BASE) MCG/ACT IN AERS: 1.0000 | INHALATION_SPRAY | Freq: Four times a day (QID) | RESPIRATORY_TRACT | Status: DC | PRN

## 2012-08-07 MED ORDER — IPRATROPIUM BROMIDE 0.02 % IN SOLN
0.5000 mg | Freq: Once | RESPIRATORY_TRACT | Status: AC
  Administered 2012-08-07: 0.5 mg via RESPIRATORY_TRACT

## 2012-08-07 MED ORDER — PREDNISONE 20 MG PO TABS
ORAL_TABLET | ORAL | Status: AC
  Filled 2012-08-07: qty 3

## 2012-08-07 MED ORDER — ALBUTEROL SULFATE (5 MG/ML) 0.5% IN NEBU
5.0000 mg | INHALATION_SOLUTION | Freq: Once | RESPIRATORY_TRACT | Status: AC
  Administered 2012-08-07: 5 mg via RESPIRATORY_TRACT

## 2012-08-07 NOTE — ED Notes (Signed)
Allergies are giving me a lot of trouble; c/o green secretions; wheezing on ascultation bilateral

## 2012-08-07 NOTE — ED Notes (Signed)
Treatment completed; states she feels about the same

## 2012-08-07 NOTE — ED Provider Notes (Signed)
Chief Complaint:   Chief Complaint  Patient presents with  . Allergies    History of Present Illness:   Diane Pacheco is a 42 year old female with asthma who smokes half a pack of cigarettes a day who presents with a five-day history of nasal congestion with yellow-green drainage, headache, sinus pressure, ear congestion, cough productive yellow-green sputum with occasional small spots of blood, wheezing, chest pain, fever of up to 101, chills, sore throat, and itchy, watery eyes. She denies any GI symptoms.  Review of Systems:  Other than noted above, the patient denies any of the following symptoms: Systemic:  No fevers, chills, sweats, weight loss or gain, fatigue, or tiredness. Eye:  No redness or discharge. ENT:  No ear pain, drainage, headache, nasal congestion, drainage, sinus pressure, difficulty swallowing, or sore throat. Neck:  No neck pain or swollen glands. Lungs:  No cough, sputum production, hemoptysis, wheezing, chest tightness, shortness of breath or chest pain. GI:  No abdominal pain, nausea, vomiting or diarrhea.  PMFSH:  Past medical history, family history, social history, meds, and allergies were reviewed. She has no medication allergies, she had an albuterol inhaler, but is empty right now, she has a prior history of asthma and smokes a half a pack of cigarettes a day.  Physical Exam:   Vital signs:  BP 121/87  Pulse 111  Temp(Src) 98.8 F (37.1 C) (Oral)  SpO2 96%  LMP 07/17/2012 General:  Alert and oriented.  In no distress.  Skin warm and dry. Eye:  No conjunctival injection or drainage. Lids were normal. ENT:  TMs and canals were normal, without erythema or inflammation.  Nasal mucosa was clear and uncongested, without drainage.  Mucous membranes were moist.  Pharynx was clear with no exudate or drainage.  There were no oral ulcerations or lesions. Neck:  Supple, no adenopathy, tenderness or mass. Lungs:  No respiratory distress.  Lungs show bilateral  expiratory wheezes, no rales or rhonchi.  Heart:  Regular rhythm, without gallops, murmers or rubs. Skin:  Clear, warm, and dry, without rash or lesions.  Radiology:  Dg Chest 2 View  08/07/2012  *RADIOLOGY REPORT*  Clinical Data:  Cough and fever for 5 days.  CHEST - 2 VIEW  Comparison: PA and lateral chest 10/28/2011.  Findings: Lungs are clear.  No pneumothorax or pleural fluid. Heart size normal.  IMPRESSION: Normal chest.   Original Report Authenticated By: Holley Dexter, M.D.    Course in Urgent Care Center:   She was given prednisone 60 mg by mouth and a DuoNeb breathing treatment. Thereafter she felt better and her lungs were almost completely wheeze free with just a few remaining expiratory wheezes.  Assessment:  The encounter diagnosis was Asthmatic bronchitis.  Plan:   1.  The following meds were prescribed:   Discharge Medication List as of 08/07/2012  1:11 PM    START taking these medications   Details  !! albuterol (PROVENTIL HFA;VENTOLIN HFA) 108 (90 BASE) MCG/ACT inhaler Inhale 1-2 puffs into the lungs every 6 (six) hours as needed for wheezing., Starting 08/07/2012, Until Discontinued, Normal    amoxicillin (AMOXIL) 500 MG capsule Take 2 capsules (1,000 mg total) by mouth 3 (three) times daily., Starting 08/07/2012, Until Discontinued, Normal    predniSONE (DELTASONE) 20 MG tablet Take 1 tablet (20 mg total) by mouth 2 (two) times daily., Starting 08/07/2012, Until Discontinued, Normal    traMADol (ULTRAM) 50 MG tablet Take 2 tablets (100 mg total) by mouth every 8 (eight) hours  as needed for pain., Starting 08/07/2012, Until Discontinued, Normal     !! - Potential duplicate medications found. Please discuss with provider.     2.  The patient was instructed in symptomatic care and handouts were given. She was strongly encouraged to quit smoking. 3.  The patient was told to return if becoming worse in any way, if no better in 3 or 4 days, and given some red flag symptoms  such as increasing fever, difficulty breathing, or worsening pain that would indicate earlier return.      Reuben Likes, MD 08/07/12 [phone]

## 2013-05-14 ENCOUNTER — Other Ambulatory Visit: Payer: Self-pay

## 2013-05-14 DIAGNOSIS — Z1231 Encounter for screening mammogram for malignant neoplasm of breast: Secondary | ICD-10-CM

## 2013-06-15 ENCOUNTER — Ambulatory Visit: Payer: Medicare Other | Admitting: Family Medicine

## 2013-06-19 ENCOUNTER — Ambulatory Visit (INDEPENDENT_AMBULATORY_CARE_PROVIDER_SITE_OTHER): Payer: Medicare Other | Admitting: Family Medicine

## 2013-06-19 ENCOUNTER — Encounter: Payer: Self-pay | Admitting: Family Medicine

## 2013-06-19 VITALS — BP 134/89 | HR 87 | Temp 98.5°F | Wt 166.0 lb

## 2013-06-19 DIAGNOSIS — M7580 Other shoulder lesions, unspecified shoulder: Secondary | ICD-10-CM | POA: Insufficient documentation

## 2013-06-19 DIAGNOSIS — J45909 Unspecified asthma, uncomplicated: Secondary | ICD-10-CM

## 2013-06-19 DIAGNOSIS — F319 Bipolar disorder, unspecified: Secondary | ICD-10-CM | POA: Diagnosis not present

## 2013-06-19 DIAGNOSIS — M67919 Unspecified disorder of synovium and tendon, unspecified shoulder: Secondary | ICD-10-CM | POA: Diagnosis not present

## 2013-06-19 DIAGNOSIS — F17200 Nicotine dependence, unspecified, uncomplicated: Secondary | ICD-10-CM

## 2013-06-19 DIAGNOSIS — M719 Bursopathy, unspecified: Secondary | ICD-10-CM

## 2013-06-19 DIAGNOSIS — Z23 Encounter for immunization: Secondary | ICD-10-CM | POA: Diagnosis not present

## 2013-06-19 MED ORDER — ALBUTEROL SULFATE HFA 108 (90 BASE) MCG/ACT IN AERS: 1.0000 | INHALATION_SPRAY | RESPIRATORY_TRACT | Status: DC | PRN

## 2013-06-19 MED ORDER — SPACER/AERO-HOLDING CHAMBERS DEVI: Status: DC

## 2013-06-19 NOTE — Assessment & Plan Note (Signed)
Trying to cut back but very stressed and h/o bipolar will make this very difficult for pt to quit

## 2013-06-19 NOTE — Patient Instructions (Signed)
You are doing very well overall Please keep your appointment with the pharmacy team for your PFT test next week Please start ibuprofen 600mg  every 6 hours for 1-2 weeks for the shoulder as you have rotator cuff tendonitis Please start the exercises below Please come back to see me in 4 weeks Impingement Syndrome, Rotator Cuff, Bursitis with Rehab Impingement syndrome is a condition that involves inflammation of the tendons of the rotator cuff and the subacromial bursa, that causes pain in the shoulder. The rotator cuff consists of four tendons and muscles that control much of the shoulder and upper arm function. The subacromial bursa is a fluid filled sac that helps reduce friction between the rotator cuff and one of the bones of the shoulder (acromion). Impingement syndrome is usually an overuse injury that causes swelling of the bursa (bursitis), swelling of the tendon (tendonitis), and/or a tear of the tendon (strain). Strains are classified into three categories. Grade 1 strains cause pain, but the tendon is not lengthened. Grade 2 strains include a lengthened ligament, due to the ligament being stretched or partially ruptured. With grade 2 strains there is still function, although the function may be decreased. Grade 3 strains include a complete tear of the tendon or muscle, and function is usually impaired. SYMPTOMS   Pain around the shoulder, often at the outer portion of the upper arm.  Pain that gets worse with shoulder function, especially when reaching overhead or lifting.  Sometimes, aching when not using the arm.  Pain that wakes you up at night.  Sometimes, tenderness, swelling, warmth, or redness over the affected area.  Loss of strength.  Limited motion of the shoulder, especially reaching behind the back (to the back pocket or to unhook bra) or across your body.  Crackling sound (crepitation) when moving the arm.  Biceps tendon pain and inflammation (in the front of the  shoulder). Worse when bending the elbow or lifting. CAUSES  Impingement syndrome is often an overuse injury, in which chronic (repetitive) motions cause the tendons or bursa to become inflamed. A strain occurs when a force is paced on the tendon or muscle that is greater than it can withstand. Common mechanisms of injury include: Stress from sudden increase in duration, frequency, or intensity of training.  Direct hit (trauma) to the shoulder.  Aging, erosion of the tendon with normal use.  Bony bump on shoulder (acromial spur). RISK INCREASES WITH:  Contact sports (football, wrestling, boxing).  Throwing sports (baseball, tennis, volleyball).  Weightlifting and bodybuilding.  Heavy labor.  Previous injury to the rotator cuff, including impingement.  Poor shoulder strength and flexibility.  Failure to warm up properly before activity.  Inadequate protective equipment.  Old age.  Bony bump on shoulder (acromial spur). PREVENTION   Warm up and stretch properly before activity.  Allow for adequate recovery between workouts.  Maintain physical fitness:  Strength, flexibility, and endurance.  Cardiovascular fitness.  Learn and use proper exercise technique. PROGNOSIS  If treated properly, impingement syndrome usually goes away within 6 weeks. Sometimes surgery is required.  RELATED COMPLICATIONS   Longer healing time if not properly treated, or if not given enough time to heal.  Recurring symptoms, that result in a chronic condition.  Shoulder stiffness, frozen shoulder, or loss of motion.  Rotator cuff tendon tear.  Recurring symptoms, especially if activity is resumed too soon, with overuse, with a direct blow, or when using poor technique. TREATMENT  Treatment first involves the use of ice and medicine, to  reduce pain and inflammation. The use of strengthening and stretching exercises may help reduce pain with activity. These exercises may be performed at home  or with a therapist. If non-surgical treatment is unsuccessful after more than 6 months, surgery may be advised. After surgery and rehabilitation, activity is usually possible in 3 months.  MEDICATION  If pain medicine is needed, nonsteroidal anti-inflammatory medicines (aspirin and ibuprofen), or other minor pain relievers (acetaminophen), are often advised.  Do not take pain medicine for 7 days before surgery.  Prescription pain relievers may be given, if your caregiver thinks they are needed. Use only as directed and only as much as you need.  Corticosteroid injections may be given by your caregiver. These injections should be reserved for the most serious cases, because they may only be given a certain number of times. HEAT AND COLD  Cold treatment (icing) should be applied for 10 to 15 minutes every 2 to 3 hours for inflammation and pain, and immediately after activity that aggravates your symptoms. Use ice packs or an ice massage.  Heat treatment may be used before performing stretching and strengthening activities prescribed by your caregiver, physical therapist, or athletic trainer. Use a heat pack or a warm water soak. SEEK MEDICAL CARE IF:   Symptoms get worse or do not improve in 4 to 6 weeks, despite treatment.  New, unexplained symptoms develop. (Drugs used in treatment may produce side effects.) EXERCISES  RANGE OF MOTION (ROM) AND STRETCHING EXERCISES - Impingement Syndrome (Rotator Cuff  Tendinitis, Bursitis) These exercises may help you when beginning to rehabilitate your injury. Your symptoms may go away with or without further involvement from your physician, physical therapist or athletic trainer. While completing these exercises, remember:   Restoring tissue flexibility helps normal motion to return to the joints. This allows healthier, less painful movement and activity.  An effective stretch should be held for at least 30 seconds.  A stretch should never be  painful. You should only feel a gentle lengthening or release in the stretched tissue. STRETCH  Flexion, Standing  Stand with good posture. With an underhand grip on your right / left hand, and an overhand grip on the opposite hand, grasp a broomstick or cane so that your hands are a little more than shoulder width apart.  Keeping your right / left elbow straight and shoulder muscles relaxed, push the stick with your opposite hand, to raise your right / left arm in front of your body and then overhead. Raise your arm until you feel a stretch in your right / left shoulder, but before you have increased shoulder pain.  Try to avoid shrugging your right / left shoulder as your arm rises, by keeping your shoulder blade tucked down and toward your mid-back spine. Hold for __________ seconds.  Slowly return to the starting position. Repeat __________ times. Complete this exercise __________ times per day. STRETCH  Abduction, Supine  Lie on your back. With an underhand grip on your right / left hand and an overhand grip on the opposite hand, grasp a broomstick or cane so that your hands are a little more than shoulder width apart.  Keeping your right / left elbow straight and your shoulder muscles relaxed, push the stick with your opposite hand, to raise your right / left arm out to the side of your body and then overhead. Raise your arm until you feel a stretch in your right / left shoulder, but before you have increased shoulder pain.  Try  to avoid shrugging your right / left shoulder as your arm rises, by keeping your shoulder blade tucked down and toward your mid-back spine. Hold for __________ seconds.  Slowly return to the starting position. Repeat __________ times. Complete this exercise __________ times per day. ROM  Flexion, Active-Assisted  Lie on your back. You may bend your knees for comfort.  Grasp a broomstick or cane so your hands are about shoulder width apart. Your right / left  hand should grip the end of the stick, so that your hand is positioned "thumbs-up," as if you were about to shake hands.  Using your healthy arm to lead, raise your right / left arm overhead, until you feel a gentle stretch in your shoulder. Hold for __________ seconds.  Use the stick to assist in returning your right / left arm to its starting position. Repeat __________ times. Complete this exercise __________ times per day.  ROM - Internal Rotation, Supine   Lie on your back on a firm surface. Place your right / left elbow about 60 degrees away from your side. Elevate your elbow with a folded towel, so that the elbow and shoulder are the same height.  Using a broomstick or cane and your strong arm, pull your right / left hand toward your body until you feel a gentle stretch, but no increase in your shoulder pain. Keep your shoulder and elbow in place throughout the exercise.  Hold for __________ seconds. Slowly return to the starting position. Repeat __________ times. Complete this exercise __________ times per day. STRETCH - Internal Rotation  Place your right / left hand behind your back, palm up.  Throw a towel or belt over your opposite shoulder. Grasp the towel with your right / left hand.  While keeping an upright posture, gently pull up on the towel, until you feel a stretch in the front of your right / left shoulder.  Avoid shrugging your right / left shoulder as your arm rises, by keeping your shoulder blade tucked down and toward your mid-back spine.  Hold for __________ seconds. Release the stretch, by lowering your healthy hand. Repeat __________ times. Complete this exercise __________ times per day. ROM - Internal Rotation   Using an underhand grip, grasp a stick behind your back with both hands.  While standing upright with good posture, slide the stick up your back until you feel a mild stretch in the front of your shoulder.  Hold for __________ seconds. Slowly  return to your starting position. Repeat __________ times. Complete this exercise __________ times per day.  STRETCH  Posterior Shoulder Capsule   Stand or sit with good posture. Grasp your right / left elbow and draw it across your chest, keeping it at the same height as your shoulder.  Pull your elbow, so your upper arm comes in closer to your chest. Pull until you feel a gentle stretch in the back of your shoulder.  Hold for __________ seconds. Repeat __________ times. Complete this exercise __________ times per day. STRENGTHENING EXERCISES - Impingement Syndrome (Rotator Cuff Tendinitis, Bursitis) These exercises may help you when beginning to rehabilitate your injury. They may resolve your symptoms with or without further involvement from your physician, physical therapist or athletic trainer. While completing these exercises, remember:  Muscles can gain both the endurance and the strength needed for everyday activities through controlled exercises.  Complete these exercises as instructed by your physician, physical therapist or athletic trainer. Increase the resistance and repetitions only as guided.  You may experience muscle soreness or fatigue, but the pain or discomfort you are trying to eliminate should never worsen during these exercises. If this pain does get worse, stop and make sure you are following the directions exactly. If the pain is still present after adjustments, discontinue the exercise until you can discuss the trouble with your clinician.  During your recovery, avoid activity or exercises which involve actions that place your injured hand or elbow above your head or behind your back or head. These positions stress the tissues which you are trying to heal. STRENGTH - Scapular Depression and Adduction   With good posture, sit on a firm chair. Support your arms in front of you, with pillows, arm rests, or on a table top. Have your elbows in line with the sides of your  body.  Gently draw your shoulder blades down and toward your mid-back spine. Gradually increase the tension, without tensing the muscles along the top of your shoulders and the back of your neck.  Hold for __________ seconds. Slowly release the tension and relax your muscles completely before starting the next repetition.  After you have practiced this exercise, remove the arm support and complete the exercise in standing as well as sitting position. Repeat __________ times. Complete this exercise __________ times per day.  STRENGTH - Shoulder Abductors, Isometric  With good posture, stand or sit about 4-6 inches from a wall, with your right / left side facing the wall.  Bend your right / left elbow. Gently press your right / left elbow into the wall. Increase the pressure gradually, until you are pressing as hard as you can, without shrugging your shoulder or increasing any shoulder discomfort.  Hold for __________ seconds.  Release the tension slowly. Relax your shoulder muscles completely before you begin the next repetition. Repeat __________ times. Complete this exercise __________ times per day.  STRENGTH - External Rotators, Isometric  Keep your right / left elbow at your side and bend it 90 degrees.  Step into a door frame so that the outside of your right / left wrist can press against the door frame without your upper arm leaving your side.  Gently press your right / left wrist into the door frame, as if you were trying to swing the back of your hand away from your stomach. Gradually increase the tension, until you are pressing as hard as you can, without shrugging your shoulder or increasing any shoulder discomfort.  Hold for __________ seconds.  Release the tension slowly. Relax your shoulder muscles completely before you begin the next repetition. Repeat __________ times. Complete this exercise __________ times per day.  STRENGTH - Supraspinatus   Stand or sit with good  posture. Grasp a __________ weight, or an exercise band or tubing, so that your hand is "thumbs-up," like you are shaking hands.  Slowly lift your right / left arm in a "V" away from your thigh, diagonally into the space between your side and straight ahead. Lift your hand to shoulder height or as far as you can, without increasing any shoulder pain. At first, many people do not lift their hands above shoulder height.  Avoid shrugging your right / left shoulder as your arm rises, by keeping your shoulder blade tucked down and toward your mid-back spine.  Hold for __________ seconds. Control the descent of your hand, as you slowly return to your starting position. Repeat __________ times. Complete this exercise __________ times per day.  STRENGTH - External Rotators  Secure a rubber exercise band or tubing to a fixed object (table, pole) so that it is at the same height as your right / left elbow when you are standing or sitting on a firm surface.  Stand or sit so that the secured exercise band is at your uninjured side.  Bend your right / left elbow 90 degrees. Place a folded towel or small pillow under your right / left arm, so that your elbow is a few inches away from your side.  Keeping the tension on the exercise band, pull it away from your body, as if pivoting on your elbow. Be sure to keep your body steady, so that the movement is coming only from your rotating shoulder.  Hold for __________ seconds. Release the tension in a controlled manner, as you return to the starting position. Repeat __________ times. Complete this exercise __________ times per day.  STRENGTH - Internal Rotators   Secure a rubber exercise band or tubing to a fixed object (table, pole) so that it is at the same height as your right / left elbow when you are standing or sitting on a firm surface.  Stand or sit so that the secured exercise band is at your right / left side.  Bend your elbow 90 degrees. Place a  folded towel or small pillow under your right / left arm so that your elbow is a few inches away from your side.  Keeping the tension on the exercise band, pull it across your body, toward your stomach. Be sure to keep your body steady, so that the movement is coming only from your rotating shoulder.  Hold for __________ seconds. Release the tension in a controlled manner, as you return to the starting position. Repeat __________ times. Complete this exercise __________ times per day.  STRENGTH - Scapular Protractors, Standing   Stand arms length away from a wall. Place your hands on the wall, keeping your elbows straight.  Begin by dropping your shoulder blades down and toward your mid-back spine.  To strengthen your protractors, keep your shoulder blades down, but slide them forward on your rib cage. It will feel as if you are lifting the back of your rib cage away from the wall. This is a subtle motion and can be challenging to complete. Ask your caregiver for further instruction, if you are not sure you are doing the exercise correctly.  Hold for __________ seconds. Slowly return to the starting position, resting the muscles completely before starting the next repetition. Repeat __________ times. Complete this exercise __________ times per day. STRENGTH - Scapular Protractors, Supine  Lie on your back on a firm surface. Extend your right / left arm straight into the air while holding a __________ weight in your hand.  Keeping your head and back in place, lift your shoulder off the floor.  Hold for __________ seconds. Slowly return to the starting position, and allow your muscles to relax completely before starting the next repetition. Repeat __________ times. Complete this exercise __________ times per day. STRENGTH - Scapular Protractors, Quadruped  Get onto your hands and knees, with your shoulders directly over your hands (or as close as you can be, comfortably).  Keeping your  elbows locked, lift the back of your rib cage up into your shoulder blades, so your mid-back rounds out. Keep your neck muscles relaxed.  Hold this position for __________ seconds. Slowly return to the starting position and allow your muscles to relax completely before starting the next repetition.  Repeat __________ times. Complete this exercise __________ times per day.  STRENGTH - Scapular Retractors  Secure a rubber exercise band or tubing to a fixed object (table, pole), so that it is at the height of your shoulders when you are either standing, or sitting on a firm armless chair.  With a palm down grip, grasp an end of the band in each hand. Straighten your elbows and lift your hands straight in front of you, at shoulder height. Step back, away from the secured end of the band, until it becomes tense.  Squeezing your shoulder blades together, draw your elbows back toward your sides, as you bend them. Keep your upper arms lifted away from your body throughout the exercise.  Hold for __________ seconds. Slowly ease the tension on the band, as you reverse the directions and return to the starting position. Repeat __________ times. Complete this exercise __________ times per day. STRENGTH - Shoulder Extensors   Secure a rubber exercise band or tubing to a fixed object (table, pole) so that it is at the height of your shoulders when you are either standing, or sitting on a firm armless chair.  With a thumbs-up grip, grasp an end of the band in each hand. Straighten your elbows and lift your hands straight in front of you, at shoulder height. Step back, away from the secured end of the band, until it becomes tense.  Squeezing your shoulder blades together, pull your hands down to the sides of your thighs. Do not allow your hands to go behind you.  Hold for __________ seconds. Slowly ease the tension on the band, as you reverse the directions and return to the starting position. Repeat  __________ times. Complete this exercise __________ times per day.  STRENGTH - Scapular Retractors and External Rotators   Secure a rubber exercise band or tubing to a fixed object (table, pole) so that it is at the height as your shoulders, when you are either standing, or sitting on a firm armless chair.  With a palm down grip, grasp an end of the band in each hand. Bend your elbows 90 degrees and lift your elbows to shoulder height, at your sides. Step back, away from the secured end of the band, until it becomes tense.  Squeezing your shoulder blades together, rotate your shoulders so that your upper arms and elbows remain stationary, but your fists travel upward to head height.  Hold for __________ seconds. Slowly ease the tension on the band, as you reverse the directions and return to the starting position. Repeat __________ times. Complete this exercise __________ times per day.  STRENGTH - Scapular Retractors and External Rotators, Rowing   Secure a rubber exercise band or tubing to a fixed object (table, pole) so that it is at the height of your shoulders, when you are either standing, or sitting on a firm armless chair.  With a palm down grip, grasp an end of the band in each hand. Straighten your elbows and lift your hands straight in front of you, at shoulder height. Step back, away from the secured end of the band, until it becomes tense.  Step 1: Squeeze your shoulder blades together. Bending your elbows, draw your hands to your chest, as if you are rowing a boat. At the end of this motion, your hands and elbow should be at shoulder height and your elbows should be out to your sides.  Step 2: Rotate your shoulders, to raise your hands above your head. Your  forearms should be vertical and your upper arms should be horizontal.  Hold for __________ seconds. Slowly ease the tension on the band, as you reverse the directions and return to the starting position. Repeat __________  times. Complete this exercise __________ times per day.  STRENGTH  Scapular Depressors  Find a sturdy chair without wheels, such as a dining room chair.  Keeping your feet on the floor, and your hands on the chair arms, lift your bottom up from the seat, and lock your elbows.  Keeping your elbows straight, allow gravity to pull your body weight down. Your shoulders will rise toward your ears.  Raise your body against gravity by drawing your shoulder blades down your back, shortening the distance between your shoulders and ears. Although your feet should always maintain contact with the floor, your feet should progressively support less body weight, as you get stronger.  Hold for __________ seconds. In a controlled and slow manner, lower your body weight to begin the next repetition. Repeat __________ times. Complete this exercise __________ times per day.  Document Released: 04/05/2005 Document Revised: 06/28/2011 Document Reviewed: 07/18/2008 [facility] LLCExitCare Patient Information 2014 [place], [place].

## 2013-06-19 NOTE — Assessment & Plan Note (Signed)
Mild rotator cuff tendonitis Ibuprofen 600mg  Q6 and exercises given Return in 4 wks

## 2013-06-19 NOTE — Progress Notes (Signed)
Diane Pacheco is a 42 y.o. female who presents to [facility] today for CPE  PAP 2013.   Mammogram: due for one now  R shoulder pain: started 10 days ago. No injury or particular event that caused the pain to come on. Hurts to put hand behind head. Feels sharp and deep in the shoulder. Has not taken anything for the pain. Getting worse  Asthma: Using albuterol every night. treid quitting smoking by using vapor cig. But could not tolerate. Plus 3 other times during day wkly. No spacer.   Tobacco: smoking 1/2ppd. Pt describes h/o bipolar. Trying to quit but very stressed at this point and unable to quit per pt  Denies CP, SOB, palpitations, hematochezia, syncope, HA, szr.   The following portions of the patient's history were reviewed and updated as appropriate: allergies, current medications, past medical history, family and social history, and problem list.  Patient is a smoker  Health Maintenance: TDAP today  Past Medical History  Diagnosis Date  . Major depression     Unable to take antidepressents due to intolerance. Prefers to handle w/o Rx  . Asthma     exacerbations w/ respiratory infxns. Well controlled w/ abuterol PRN  . Condyloma acuminatum     ROS as above otherwise neg.    Medications reviewed. Current Outpatient Prescriptions  Medication Sig Dispense Refill  . albuterol (PROVENTIL HFA;VENTOLIN HFA) 108 (90 BASE) MCG/ACT inhaler Inhale 1-2 puffs into the lungs every 4 (four) hours as needed for wheezing.  2 Inhaler  prn  . amoxicillin (AMOXIL) 500 MG capsule Take 2 capsules (1,000 mg total) by mouth 3 (three) times daily.  60 capsule  0  . predniSONE (DELTASONE) 20 MG tablet Take 1 tablet (20 mg total) by mouth 2 (two) times daily.  10 tablet  0  . PredniSONE 5 MG KIT Take 1 kit (5 mg total) by mouth daily after breakfast. Prednisone 5 mg 6 day dosepack.  Take as directed.  1 kit  0  . Spacer/Aero-Holding Dorise Bullion Use with inhaler as instructed  2 each  1  .  traMADol (ULTRAM) 50 MG tablet Take 2 tablets (100 mg total) by mouth every 8 (eight) hours as needed for pain.  30 tablet  0   No current facility-administered medications for this visit.    Exam: BP 134/89  Pulse 87  Temp(Src) 98.5 F (36.9 C) (Oral)  Wt 166 lb (75.297 kg)  LMP 05/28/2013 Gen: Well NAD HEENT: EOMI,  MMM Lungs: CTABL Nl WOB Heart: RRR no MRG - quiet heart sounds Abd: NABS, NT, ND Exts: Non edematous BL  LE, warm and well perfused.  MSK: Hawking + on R and mild ttp on R. R shoulder ROM limited w/ benind head otherwise nml. Empty can neg.   No results found for this or any previous visit (from the past 72 hour(s)).  A/P (as seen in Problem list)  TOBACCO USER Trying to cut back but very stressed and h/o bipolar will make this very difficult for pt to quit   Bipolar disorder, unspecified H/o bipolar per pt but no dx from our office.  Pt sees outside provider.  Not on any medications No sign of flare   Asthma, chronic Daily use of albuterol is too much Anticipate COPD but no formal dx Discussed w/ pharmacy team who agreed to see pt next week for PFT and tobacco counseling. Therefore I will not start any additional controller medications at this time though the pt will  likely need this NO signs of acute exacerbation at this time Refill albuterol and spacer  Rotator cuff tendonitis Mild rotator cuff tendonitis Ibuprofen 666m Q6 and exercises given Return in 4 wks

## 2013-06-19 NOTE — Assessment & Plan Note (Signed)
Daily use of albuterol is too much Anticipate COPD but no formal dx Discussed w/ pharmacy team who agreed to see pt next week for PFT and tobacco counseling. Therefore I will not start any additional controller medications at this time though the pt will likely need this NO signs of acute exacerbation at this time Refill albuterol and spacer

## 2013-06-19 NOTE — Assessment & Plan Note (Signed)
H/o bipolar per pt but no dx from our office.  Pt sees outside provider.  Not on any medications No sign of flare

## 2013-06-25 ENCOUNTER — Ambulatory Visit (INDEPENDENT_AMBULATORY_CARE_PROVIDER_SITE_OTHER): Payer: Medicare Other | Admitting: Pharmacist

## 2013-06-25 ENCOUNTER — Encounter: Payer: Self-pay | Admitting: Pharmacist

## 2013-06-25 VITALS — BP 135/89 | HR 78 | Ht 64.0 in | Wt 164.0 lb

## 2013-06-25 DIAGNOSIS — J449 Chronic obstructive pulmonary disease, unspecified: Secondary | ICD-10-CM | POA: Diagnosis not present

## 2013-06-25 DIAGNOSIS — J45909 Unspecified asthma, uncomplicated: Secondary | ICD-10-CM

## 2013-06-25 DIAGNOSIS — F17200 Nicotine dependence, unspecified, uncomplicated: Secondary | ICD-10-CM | POA: Diagnosis not present

## 2013-06-25 MED ORDER — TIOTROPIUM BROMIDE MONOHYDRATE 18 MCG IN CAPS: 18.0000 ug | ORAL_CAPSULE | Freq: Every day | RESPIRATORY_TRACT | Status: DC

## 2013-06-25 NOTE — Assessment & Plan Note (Signed)
Spirometry evaluation reveals mild obstructive lung disease corresponding to GOLD Classification A based on spirometry,  0 exacerbations in the past year and mMRC score of 3.    Patient has been experiencing shortness of breath for about a year and a half and taking albuterol HFA as needed. begin Spiriva treatment plan at this time as this may assist with breathing without worsening anxiety (possibly related to use of albuterol).  Educated patient on purpose, proper use, potential adverse effects including dry mouth.  Reviewed results of pulmonary function tests.  Pt verbalized understanding of results and education.  Written pt instructions provided.    moderate Nicotine Dependence of 26 years duration in a patient who is fair candidate for success b/c of her anxiety level. Today we talked about the importance of quitting and the patient states she knows the importance but with her stress level she is not ready to make a change at this time.  We encouraged setting short term goals and provided information on 1 800-QUIT NOW support program.    F/U with Dr. Margot AblesMerrill in about a month.   Total time in face to face counseling 40 minutes.  Patient seen with Ollen BowlLauren Hickman, PharmD Candidate and Piedad ClimesKelley Miller, PhamD Resident.

## 2013-06-25 NOTE — Assessment & Plan Note (Signed)
Spirometry evaluation reveals mild/moderate obstructive lung disease corresponding to GOLD Classification A/B based on spirometry,  0 exacerbations in the past year and mMRC score of 3.    Patient has been experiencing shortness of breath for about a year and a half and taking albuterol HFA as needed. begin Spiriva treatment plan at this time.  Educated patient on purpose, proper use, potential adverse effects including dry mouth.  Reviewed results of pulmonary function tests.  Pt verbalized understanding of results and education.  Written pt instructions provided.

## 2013-06-25 NOTE — Progress Notes (Signed)
S:    Patient arrives somewhat nervous with her baseball hat on.    Presents for lung function evaluation and assistance with tobacco dependence.  Patient reports being short of breath for the last year or so.  She has cut back on the number of cigarettes down to a half a pack and this has improved her breathing somewhat.  She states she uses her albuterol inhaler at least once a night because she wakes up short of breath.  She states if she uses the inhaler before bed she is less short of breath when she wakes up.  She also endorses smoking marijuana 2-3 times a day to help with anxiety.    Age when started using tobacco on a daily basis 16 years. Number of Cigarettes per day 10. Brand smoked [place] light 100s. Estimated Nicotine Content per Cigarette (mg) 0.6-0.8.  Estimated Nicotine intake per day 8 mg.   Smokes first cigarette ~30 minutes after waking. Denies waking to smoke.    Estimated Fagerstrom Score 4/10.  Most recent quit attempt none Longest time ever been tobacco free N/A. What Medications (NRT, bupropion, varenicline) used in past includes patches and vapor. No bupropion or varenicline due to bipolar disorder Rates IMPORTANCE of quitting tobacco on 1-10 scale of 10. Rates READINESS of quitting tobacco on 1-10 scale of 2. Rates CONFIDENCE of quitting tobacco on 1-10 scale of 0. Triggers to use tobacco include: around meals and stress with teenagers, and taking care of disabled husband.      O: mMRC score= 3  See "scanned report" or Documentation Flowsheet (discrete results - PFTs) for  Spirometry results. Patient provided good effort while attempting spirometry.   Lung Age = 74 years Albuterol Neb  Lot# [id number]     Exp. 10/16  A/P:  Spirometry evaluation reveals mild obstructive lung disease corresponding to GOLD Classification A based on spirometry,  0 exacerbations in the past year and mMRC score of 3.    Patient has been experiencing shortness of breath for about a year  and a half and taking albuterol HFA as needed. begin Spiriva treatment plan at this time as this may assist with breathing without worsening anxiety (possibly related to use of albuterol).  Educated patient on purpose, proper use, potential adverse effects including dry mouth.  Reviewed results of pulmonary function tests.  Pt verbalized understanding of results and education.  Written pt instructions provided.    moderate Nicotine Dependence of 26 years duration in a patient who is fair candidate for success b/c of her anxiety level. Today we talked about the importance of quitting and the patient states she knows the importance but with her stress level she is not ready to make a change at this time.  We encouraged setting short term goals and provided information on 1 800-QUIT NOW support program.    F/U with Dr. Margot AblesMerrill in about a month.   Total time in face to face counseling 40 minutes.  Patient seen with Ollen BowlLauren Hickman, PharmD Candidate and Piedad ClimesKelley Miller, PhamD Resident.

## 2013-06-25 NOTE — Patient Instructions (Addendum)
Thank you for coming to see us today!  Your lung function showed mild obstruction due to your smoking history.    Start taking Spiriva once daily to improve lung function.  Try to think about smoking goals if possible.

## 2013-06-25 NOTE — Assessment & Plan Note (Signed)
moderate Nicotine Dependence of 26 years duration in a patient who is fair candidate for success b/c of her anxiety level. Today we talked about the importance of quitting and the patient states she knows the importance but with her stress level she is not ready to make a change at this time.  We encouraged setting short term goals and provided information on 1 800-QUIT NOW support program.

## 2013-06-25 NOTE — Progress Notes (Signed)
Patient ID: Diane SchilderChelsea B Rashid, female   DOB: 05/26/1970, 43 y.o.   MRN: [id number] Reviewed: Agree with Dr. Macky LowerKoval's documentation and management.

## 2013-07-30 ENCOUNTER — Ambulatory Visit: Payer: Medicare Other | Admitting: Family Medicine

## 2013-08-02 ENCOUNTER — Other Ambulatory Visit: Payer: Self-pay

## 2013-08-02 ENCOUNTER — Encounter: Payer: Self-pay | Admitting: Family Medicine

## 2013-08-02 ENCOUNTER — Ambulatory Visit (INDEPENDENT_AMBULATORY_CARE_PROVIDER_SITE_OTHER): Payer: Medicare Other | Admitting: Family Medicine

## 2013-08-02 VITALS — BP 115/83 | HR 75 | Temp 98.3°F | Wt 165.0 lb

## 2013-08-02 DIAGNOSIS — M67919 Unspecified disorder of synovium and tendon, unspecified shoulder: Secondary | ICD-10-CM | POA: Diagnosis not present

## 2013-08-02 DIAGNOSIS — R21 Rash and other nonspecific skin eruption: Secondary | ICD-10-CM

## 2013-08-02 DIAGNOSIS — M719 Bursopathy, unspecified: Secondary | ICD-10-CM

## 2013-08-02 DIAGNOSIS — F17200 Nicotine dependence, unspecified, uncomplicated: Secondary | ICD-10-CM | POA: Diagnosis not present

## 2013-08-02 DIAGNOSIS — J449 Chronic obstructive pulmonary disease, unspecified: Secondary | ICD-10-CM | POA: Diagnosis not present

## 2013-08-02 DIAGNOSIS — Z1231 Encounter for screening mammogram for malignant neoplasm of breast: Secondary | ICD-10-CM

## 2013-08-02 DIAGNOSIS — M7580 Other shoulder lesions, unspecified shoulder: Secondary | ICD-10-CM

## 2013-08-02 MED ORDER — PREDNISONE 50 MG PO TABS: ORAL_TABLET | ORAL | Status: DC

## 2013-08-02 NOTE — Assessment & Plan Note (Signed)
No interestedi n quitting due to mental health per pt. 08/02/13

## 2013-08-02 NOTE — Assessment & Plan Note (Signed)
Slowly improving Cont PRN NSAIDs and exercises.

## 2013-08-02 NOTE — Assessment & Plan Note (Addendum)
On spiriva.  Continue current therapy Cont albuterol Discussed tobacco cessation

## 2013-08-02 NOTE — Progress Notes (Signed)
Diane SchilderChelsea Pacheco Pacheco is a 43 y.o. female who [facility]  Tobacco: 1/2ppd. Reviewed PFT testing by Dr. Raymondo BandKoval and discussed w/ pt.   COPD: using albuterol 3x wkly at night. No daytime uses. Started spiriva about 3 wks ago. No change thus far. Continues to smoke as above.   [place]oulder p[ain. Slowly improving. Taking ibuprofen 4x wkly.  Doing daily exercises.   Rash on R and L hands. Itchy. oingoing for 2 wks. Gardens on a regular basis. Did not see any poison ivy or oak. Using calamine lotion w/o benefit. Husband does not have similar itch. Occasionally pusses. No change over 2 wks.   The following portions of the patient's history were reviewed and updated as appropriate: allergies, current medications, past medical history, family and social history, and problem list.  Patient is a smoker  Past Medical History  Diagnosis Date  . Major depression     Unable to take antidepressents due to intolerance. Prefers to handle w/o Rx  . Asthma     exacerbations w/ respiratory infxns. Well controlled w/ abuterol PRN  . Condyloma acuminatum     ROS as above otherwise neg.    Medications reviewed. Current Outpatient Prescriptions  Medication Sig Dispense Refill  . albuterol (PROVENTIL HFA;VENTOLIN HFA) 108 (90 BASE) MCG/ACT inhaler Inhale 1-2 puffs into the lungs every 4 (four) hours as needed for wheezing.  2 Inhaler  prn  . predniSONE (DELTASONE) 50 MG tablet Take daily with breakfast  50 tablet  0  . Spacer/Aero-Holding Rudean Curthambers DEVI Use with inhaler as instructed  2 each  1  . tiotropium (SPIRIVA HANDIHALER) 18 MCG inhalation capsule Place 1 capsule (18 mcg total) into inhaler and inhale daily.  30 capsule  3  . traMADol (ULTRAM) 50 MG tablet Take 2 tablets (100 mg total) by mouth every 8 (eight) hours as needed for pain.  30 tablet  0   No current facility-administered medications for this visit.    Exam:  BP 115/83  Pulse 75  Temp(Src) 98.3 F (36.8 C) (Oral)  Wt 165  lb (74.844 kg)  LMP 07/18/2013 Gen: Well NAD HEENT: EOMI,  MMM SKin: 2-3 macular, papular lesions between the webbed space of the first and second digits.   No results found for this or any previous visit (from the past 72 hour(s)).  A/P (as seen in Problem list)  COPD, mild On spiriva.  Continue current therapy Cont albuterol Discussed tobacco cessation  TOBACCO USER No interestedi n quitting due to mental health per pt. 08/02/13   Rotator cuff tendonitis Slowly improving Cont PRN NSAIDs and exercises.   Rash of hands Likely allergic dermatitis Start 5 days prednisone 50mg .  Unlikely scabies as not spreading husband not affected but consider permethrine if does not clear w/ prednisone   Start routine mammogram

## 2013-08-02 NOTE — Patient Instructions (Signed)
The rash  On your hand is likely an allergic reaction. Please start the prednisone for 5 days If the rash comes back then call and we will treat you for scabies Please continue to try to quit smoking and take your spiriva Please go get your mammogram done Come back to see me in 3 months

## 2013-08-02 NOTE — Assessment & Plan Note (Signed)
Likely allergic dermatitis Start 5 days prednisone 50mg .  Unlikely scabies as not spreading husband not affected but consider permethrine if does not clear w/ prednisone

## 2013-08-08 ENCOUNTER — Ambulatory Visit
Admission: RE | Admit: 2013-08-08 | Discharge: 2013-08-08 | Disposition: A | Discharge status: Home / Self Care | Payer: Medicare Other | Source: Ambulatory Visit

## 2013-08-08 DIAGNOSIS — Z1231 Encounter for screening mammogram for malignant neoplasm of breast: Secondary | ICD-10-CM

## 2013-10-17 ENCOUNTER — Encounter (HOSPITAL_COMMUNITY): Payer: Self-pay | Admitting: Emergency Medicine

## 2013-10-17 ENCOUNTER — Emergency Department (INDEPENDENT_AMBULATORY_CARE_PROVIDER_SITE_OTHER)
Admission: EM | Admit: 2013-10-17 | Discharge: 2013-10-17 | Disposition: A | Discharge status: Home / Self Care | Payer: Medicare Other | Source: Home / Self Care | Attending: Emergency Medicine | Admitting: Emergency Medicine

## 2013-10-17 DIAGNOSIS — H00019 Hordeolum externum unspecified eye, unspecified eyelid: Secondary | ICD-10-CM | POA: Diagnosis not present

## 2013-10-17 DIAGNOSIS — H00016 Hordeolum externum left eye, unspecified eyelid: Secondary | ICD-10-CM

## 2013-10-17 MED ORDER — POLYMYXIN B-TRIMETHOPRIM 10000-0.1 UNIT/ML-% OP SOLN: 2.0000 [drp] | Freq: Three times a day (TID) | OPHTHALMIC | Status: DC

## 2013-10-17 NOTE — ED Provider Notes (Signed)
Medical screening examination/treatment/procedure(s) were performed by non-physician practitioner and as supervising physician I was immediately available for consultation/collaboration.  Alizey Noren, M.D.  Ovadia Lopp C Davanta Meuser, MD 10/17/13 1111 

## 2013-10-17 NOTE — Discharge Instructions (Signed)

## 2013-10-17 NOTE — ED Notes (Signed)
Pt c/o swelling of left lower eye lid onset 5 days Believes it maybe her lacrimal duct that's not draining Pain is 3/10; pain has radiated to left side of face/cheek Alert w/no signs of acute distress.

## 2013-10-17 NOTE — ED Provider Notes (Signed)
CSN: [id number]     Arrival date & time 10/17/13  [id number] History   First MD Initiated Contact with Patient 10/17/13 [phone]     Chief Complaint  Patient presents with  . Eye Problem   (Consider location/radiation/quality/duration/timing/severity/associated sxs/prior Treatment) HPI Comments: Reports uncomfortable swelling of left lower eyelid x 5 days. Has attempted to treat at home with warm compresses with little resolution. Denies changes in vision or contact lens use. No fever/chills. No reported injury.    Patient is a 43 y.o. female presenting with eye problem. The history is provided by the patient.  Eye Problem   Past Medical History  Diagnosis Date  . Major depression     Unable to take antidepressents due to intolerance. Prefers to handle w/o Rx  . Asthma     exacerbations w/ respiratory infxns. Well controlled w/ abuterol PRN  . Condyloma acuminatum    History reviewed. No pertinent past surgical history. Family History  Problem Relation Age of Onset  . Cancer Mother    History  Substance Use Topics  . Smoking status: Current Every Day Smoker -- 0.50 packs/day  . Smokeless tobacco: Not on file  . Alcohol Use: No   OB History   Grav Para Term Preterm Abortions TAB SAB Ect Mult Living                 Review of Systems  All other systems reviewed and are negative.   Allergies  Review of patient's allergies indicates no known allergies.  Home Medications   Prior to Admission medications   Medication Sig Start Date End Date Taking? Authorizing Provider  albuterol (PROVENTIL HFA;VENTOLIN HFA) 108 (90 BASE) MCG/ACT inhaler Inhale 1-2 puffs into the lungs every 4 (four) hours as needed for wheezing. 06/19/13 06/19/14 Yes Ozella Rocksavid J Merrell, MD  tiotropium (SPIRIVA HANDIHALER) 18 MCG inhalation capsule Place 1 capsule (18 mcg total) into inhaler and inhale daily. 06/25/13  Yes Sanjuana LettersWilliam Arthur Hensel, MD  predniSONE (DELTASONE) 50 MG tablet Take daily with breakfast 08/02/13    Ozella Rocksavid J Merrell, MD  Spacer/Aero-Holding Deretha Emoryhambers DEVI Use with inhaler as instructed 06/19/13   Ozella Rocksavid J Merrell, MD  traMADol (ULTRAM) 50 MG tablet Take 2 tablets (100 mg total) by mouth every 8 (eight) hours as needed for pain. 08/07/12   Reuben Likesavid C Keller, MD  trimethoprim-polymyxin b (POLYTRIM) ophthalmic solution Place 2 drops into the left eye 3 (three) times daily. X 7 days 10/17/13   Jess BartersJennifer Lee Presson, PA   BP 115/82  Pulse 70  Temp(Src) 98.4 F (36.9 C) (Oral)  Resp 16  SpO2 97%  LMP 10/03/2013 Physical Exam  Nursing note and vitals reviewed. Constitutional: She is oriented to person, place, and time. She appears well-developed and well-nourished. No distress.  HENT:  Head: Normocephalic and atraumatic.  Right Ear: Hearing and external ear normal.  Left Ear: Hearing and external ear normal.  Nose: Nose normal.  Eyes: Conjunctivae are normal. Pupils are equal, round, and reactive to light. Lids are everted and swept, no foreign bodies found. Left eye exhibits hordeolum. Left eye exhibits no discharge and no exudate. No foreign body present in the left eye. Left conjunctiva is not injected. Left conjunctiva has no hemorrhage. No scleral icterus. Left eye exhibits normal extraocular motion and no nystagmus.  Cardiovascular: Normal rate.   Pulmonary/Chest: Effort normal.  Musculoskeletal: Normal range of motion.  Neurological: She is alert and oriented to person, place, and time.  Skin: Skin is warm and dry.  Psychiatric: She has a normal mood and affect. Her behavior is normal.    ED Course  Procedures (including critical care time) Labs Review Labs Reviewed - No data to display  Imaging Review No results found.   MDM   1. Hordeolum externum (stye), left    Advised to continue with warm compresses at home and use Polytrim opthalmic as prescribed with follow up if no improvement.    Jess BartersJennifer Lee [place], [place] 10/17/13 1017

## 2013-10-18 ENCOUNTER — Ambulatory Visit: Payer: Medicare Other | Admitting: Family Medicine

## 2014-01-16 ENCOUNTER — Emergency Department (INDEPENDENT_AMBULATORY_CARE_PROVIDER_SITE_OTHER)
Admission: EM | Admit: 2014-01-16 | Discharge: 2014-01-16 | Disposition: A | Discharge status: Home / Self Care | Payer: Medicare Other | Source: Home / Self Care | Attending: Emergency Medicine | Admitting: Emergency Medicine

## 2014-01-16 ENCOUNTER — Encounter (HOSPITAL_COMMUNITY): Payer: Self-pay | Admitting: Emergency Medicine

## 2014-01-16 DIAGNOSIS — X58XXXA Exposure to other specified factors, initial encounter: Secondary | ICD-10-CM | POA: Diagnosis not present

## 2014-01-16 DIAGNOSIS — S239XXA Sprain of unspecified parts of thorax, initial encounter: Secondary | ICD-10-CM | POA: Diagnosis not present

## 2014-01-16 DIAGNOSIS — S29019A Strain of muscle and tendon of unspecified wall of thorax, initial encounter: Secondary | ICD-10-CM

## 2014-01-16 HISTORY — DX: Chronic obstructive pulmonary disease, unspecified: J44.9

## 2014-01-16 MED ORDER — KETOROLAC TROMETHAMINE 60 MG/2ML IM SOLN
60.0000 mg | Freq: Once | INTRAMUSCULAR | Status: AC
  Administered 2014-01-16: 60 mg via INTRAMUSCULAR

## 2014-01-16 MED ORDER — KETOROLAC TROMETHAMINE 60 MG/2ML IM SOLN
INTRAMUSCULAR | Status: AC
  Filled 2014-01-16: qty 2

## 2014-01-16 MED ORDER — CYCLOBENZAPRINE HCL 5 MG PO TABS: 5.0000 mg | ORAL_TABLET | Freq: Three times a day (TID) | ORAL | Status: DC | PRN

## 2014-01-16 MED ORDER — INDOMETHACIN 25 MG PO CAPS: 25.0000 mg | ORAL_CAPSULE | Freq: Three times a day (TID) | ORAL | Status: DC | PRN

## 2014-01-16 NOTE — Discharge Instructions (Signed)

## 2014-01-16 NOTE — ED Provider Notes (Signed)
Medical screening examination/treatment/procedure(s) were performed by non-physician practitioner and as supervising physician I was immediately available for consultation/collaboration.  Samel Bruna, M.D.  Shiquan Mathieu C Cole Klugh, MD 01/16/14 2155 

## 2014-01-16 NOTE — ED Notes (Signed)
Pt states she has been having back for 4 days. Pt is not sure why her back is the way it is. Pt states pain is sharp at times states that it is a 7 out of 10 at this time. VSS. No acute distress at this time.

## 2014-01-16 NOTE — ED Provider Notes (Addendum)
CSN: [id number]     Arrival date & time 01/16/14  1444 History   First MD Initiated Contact with Patient 01/16/14 1539     Chief Complaint  Patient presents with  . Back Pain   (Consider location/radiation/quality/duration/timing/severity/associated sxs/prior Treatment) HPI Comments: States area in between and underneath both her shoulder blades is uncomfortable to the touch PCP: [facility]  Patient is a 43 y.o. female presenting with back pain. The history is provided by the patient.  Back Pain Location:  Thoracic spine Quality:  Stabbing and aching Pain severity:  Moderate Onset quality:  Gradual Duration:  4 days Timing:  Constant Progression:  Worsening Chronicity:  New Context: not emotional stress, not falling, not lifting heavy objects, not recent illness and not recent injury   Worsened by:  Bending, movement and touching (+raising arms overhead) Associated symptoms: no abdominal pain, no chest pain, no dysuria, no fever, no numbness, no paresthesias and no weakness   Associated symptoms comment:  +mild non-productive cough   Past Medical History  Diagnosis Date  . Major depression     Unable to take antidepressents due to intolerance. Prefers to handle w/o Rx  . Asthma     exacerbations w/ respiratory infxns. Well controlled w/ abuterol PRN  . Condyloma acuminatum   . COPD (chronic obstructive pulmonary disease)    Past Surgical History  Procedure Laterality Date  . Tubal ligation     Family History  Problem Relation Age of Onset  . Cancer Mother    History  Substance Use Topics  . Smoking status: Current Every Day Smoker -- 1.00 packs/day  . Smokeless tobacco: Not on file  . Alcohol Use: Yes     Comment: weekly weekends per pt   OB History   Grav Para Term Preterm Abortions TAB SAB Ect Mult Living                 Review of Systems  Constitutional: Negative for fever.  Eyes: Negative.   Respiratory: Negative for cough, chest tightness, shortness of  breath, wheezing and stridor.   Cardiovascular: Negative for chest pain.  Gastrointestinal: Negative.  Negative for abdominal pain.  Genitourinary: Negative for dysuria, hematuria, flank pain and difficulty urinating.  Musculoskeletal: Positive for back pain.  Skin: Negative.   Neurological: Negative for dizziness, weakness, numbness and paresthesias.    Allergies  Review of patient's allergies indicates no known allergies.  Home Medications   Prior to Admission medications   Medication Sig Start Date End Date Taking? Authorizing Provider  albuterol (PROVENTIL HFA;VENTOLIN HFA) 108 (90 BASE) MCG/ACT inhaler Inhale 1-2 puffs into the lungs every 4 (four) hours as needed for wheezing. 06/19/13 06/19/14  Ozella Rocksavid J Merrell, MD  cyclobenzaprine (FLEXERIL) 5 MG tablet Take 1 tablet (5 mg total) by mouth 3 (three) times daily as needed for muscle spasms. 01/16/14   Mathis FareJennifer Lee H Nathanyel Defenbaugh, PA  indomethacin (INDOCIN) 25 MG capsule Take 1 capsule (25 mg total) by mouth 3 (three) times daily as needed. For back pain 01/16/14   Ria ClockJennifer Lee H Jeneane Pieczynski, PA  predniSONE (DELTASONE) 50 MG tablet Take daily with breakfast 08/02/13   Ozella Rocksavid J Merrell, MD  Spacer/Aero-Holding Deretha Emoryhambers DEVI Use with inhaler as instructed 06/19/13   Ozella Rocksavid J Merrell, MD  tiotropium (SPIRIVA HANDIHALER) 18 MCG inhalation capsule Place 1 capsule (18 mcg total) into inhaler and inhale daily. 06/25/13   Sanjuana LettersWilliam Arthur Hensel, MD  traMADol (ULTRAM) 50 MG tablet Take 2 tablets (100 mg total) by mouth every  8 (eight) hours as needed for pain. 08/07/12   Reuben Likes, MD  trimethoprim-polymyxin b (POLYTRIM) ophthalmic solution Place 2 drops into the left eye 3 (three) times daily. X 7 days 10/17/13   Mathis Fare Marsheila Alejo, PA   BP 110/76  Pulse 90  Temp(Src) 98.1 F (36.7 C) (Oral)  Resp 20  SpO2 95%  LMP 12/23/2013 Physical Exam  Nursing note and vitals reviewed. Constitutional: She is oriented to person, place, and time. She appears  well-developed and well-nourished.  HENT:  Head: Normocephalic and atraumatic.  Eyes: Conjunctivae are normal. No scleral icterus.  Neck: Normal range of motion. Neck supple.  Cardiovascular: Normal rate, regular rhythm and normal heart sounds.   Pulmonary/Chest: Effort normal and breath sounds normal.  Abdominal: Soft. Bowel sounds are normal. She exhibits no distension. There is no tenderness.  Musculoskeletal: Normal range of motion.       Back:  Outlined areas are regions of discomfort with palpation or ROM of bilateral UEs No midline discomfort  Neurological: She is alert and oriented to person, place, and time.  Skin: Skin is warm and dry. No rash noted. No erythema.  Psychiatric: She has a normal mood and affect. Her behavior is normal.    ED Course  Procedures (including critical care time) Labs Review Labs Reviewed - No data to display  Imaging Review No results found.   MDM   1. Thoracic myofascial strain, initial encounter    ECG: NSR with sinus arrhythmia at 76bpm and without ST/T wave changes or ectopy. Exam suggests musculoskeletal origin of pain given that symptoms are completely reproducible with palpation and ROM activity. Will advise use of indocin and flexeril as prescribed with PCP follow up if no improvement.   Ria Clock, [place] 01/16/14 1638  01/17/2014 Addendum @ 10:50am: Patient given 60mg  IM dose of toradol while in clinic for pain management on 01/16/2014  Ria Clock, PA 01/17/14 1051

## 2014-01-17 NOTE — ED Provider Notes (Signed)
Medical screening examination/treatment/procedure(s) were performed by non-physician practitioner and as supervising physician I was immediately available for consultation/collaboration.  Keiley Levey, M.D.  Amere Bricco C Madia Carvell, MD 01/17/14 2228 

## 2014-06-26 ENCOUNTER — Ambulatory Visit (INDEPENDENT_AMBULATORY_CARE_PROVIDER_SITE_OTHER): Payer: Medicare Other | Admitting: Family Medicine

## 2014-06-26 ENCOUNTER — Encounter: Payer: Self-pay | Admitting: Family Medicine

## 2014-06-26 VITALS — BP 119/78 | HR 93 | Temp 98.9°F | Ht 64.0 in | Wt 165.0 lb

## 2014-06-26 DIAGNOSIS — J441 Chronic obstructive pulmonary disease with (acute) exacerbation: Secondary | ICD-10-CM

## 2014-06-26 DIAGNOSIS — Z720 Tobacco use: Secondary | ICD-10-CM

## 2014-06-26 DIAGNOSIS — F17200 Nicotine dependence, unspecified, uncomplicated: Secondary | ICD-10-CM

## 2014-06-26 MED ORDER — NICOTINE 21 MG/24HR TD PT24: 21.0000 mg | MEDICATED_PATCH | Freq: Every day | TRANSDERMAL | Status: DC

## 2014-06-26 MED ORDER — DOXYCYCLINE HYCLATE 100 MG PO TABS: 100.0000 mg | ORAL_TABLET | Freq: Two times a day (BID) | ORAL | Status: DC

## 2014-06-26 MED ORDER — ALBUTEROL SULFATE HFA 108 (90 BASE) MCG/ACT IN AERS: 1.0000 | INHALATION_SPRAY | RESPIRATORY_TRACT | Status: DC | PRN

## 2014-06-26 MED ORDER — PREDNISONE 50 MG PO TABS: 50.0000 mg | ORAL_TABLET | Freq: Every day | ORAL | Status: DC

## 2014-06-26 NOTE — Patient Instructions (Signed)
It was nice to see you today.  You are experiencing a COPD exacerbation.  I am treating your with albuterol, steroids, and an antibiotic.  Follow up as directed.  Take care

## 2014-06-26 NOTE — Assessment & Plan Note (Signed)
History physical exam consistent with COPD exacerbation. Likely secondary to recent viral illness. Will treat with prednisone, doxycycline and albuterol. I refilled the patient's albuterol today.

## 2014-06-26 NOTE — Assessment & Plan Note (Signed)
I discussed this with the patient. She would like to start nicotine replacement with patches. Nicotine patch sent to pharmacy. Patient needs to follow closely with PCP.

## 2014-06-26 NOTE — Progress Notes (Signed)
   Subjective:    Patient ID: Diane Pacheco, female    DOB: 06/29/1970, 44 y.o.   MRN: [id number]  HPI 44 year old female with tobacco abuse and COPD presents for same day appointment with complaints of a "cold".  1) "Cold"  Patient reports she has had URI symptoms for the past 1.5 weeks.   She states that she has been expressing cough and wheezing as well.   She notes associated shortness of breath and productive cough. She also notes fever and chills.  She's recently run out of her albuterol.   Her shortness of breath exacerbated by cough.  She's been taking Mucinex with marked improvement.  She notes that she has had a positive sick contact in her 42 year old daughter.  Social Hx -  Daily smoker.  1 ppd, sometimes more.  Review of Systems Per HPI    Objective:   Physical Exam Filed Vitals:   06/26/14 1540  BP: 119/78  Pulse: 93  Temp: 98.9 F (37.2 C)   Exam: General: well appearing, NAD. HEENT: NCAT.  normal TMs bilaterally. Oropharynx mildly erythematous. Cardiovascular: RRR. No murmurs, rubs, or gallops. Respiratory: Diffuse expiratory wheezing noted.    Assessment & Plan:  See Problem List

## 2015-01-07 ENCOUNTER — Encounter: Payer: Self-pay | Admitting: Family Medicine

## 2015-01-07 ENCOUNTER — Ambulatory Visit (INDEPENDENT_AMBULATORY_CARE_PROVIDER_SITE_OTHER): Payer: Medicare Other | Admitting: Family Medicine

## 2015-01-07 VITALS — BP 122/85 | HR 95 | Temp 99.0°F | Wt 160.0 lb

## 2015-01-07 DIAGNOSIS — M7551 Bursitis of right shoulder: Secondary | ICD-10-CM

## 2015-01-07 DIAGNOSIS — M25511 Pain in right shoulder: Secondary | ICD-10-CM

## 2015-01-07 DIAGNOSIS — M7520 Bicipital tendinitis, unspecified shoulder: Secondary | ICD-10-CM | POA: Insufficient documentation

## 2015-01-07 DIAGNOSIS — M7521 Bicipital tendinitis, right shoulder: Secondary | ICD-10-CM

## 2015-01-07 DIAGNOSIS — M7550 Bursitis of unspecified shoulder: Secondary | ICD-10-CM | POA: Insufficient documentation

## 2015-01-07 DIAGNOSIS — Z23 Encounter for immunization: Secondary | ICD-10-CM | POA: Diagnosis present

## 2015-01-07 MED ORDER — METHYLPREDNISOLONE ACETATE 40 MG/ML IJ SUSP
40.0000 mg | Freq: Once | INTRAMUSCULAR | Status: AC
  Administered 2015-01-07: 40 mg via INTRAMUSCULAR

## 2015-01-07 NOTE — Assessment & Plan Note (Signed)
Findings c/w Subacromial Bursitis. Likely secondary to [facility] pathology and repetitive activities required for her work. There was no history of trauma/injury and no atrophy or RC weakness appreciated that would lead [place] to believe RC full thickness tear. ROM preserved. At this time labral pathology can NOT be ruled out (as this may be presenting as the posterior pain). Bicipital groove tenderness and positive yergason's makes biceps tendonitis a very likely diagnosis as well. The positive O'Brien's test may not be accurate as the bicipital tendonitis can cause a false positive result.  - Corticosteroid injection to right shoulder today in clinic - Instruction to ice liberally. - Rotator cuff strengthening exercises/stretches - OTC NSAIDS as directed by manufacturer - Follow-up in 2-3 weeks with PCP  (See "Next" section at end of student's note for next-step treatment considerations )

## 2015-01-07 NOTE — Assessment & Plan Note (Signed)
See Above

## 2015-01-07 NOTE — Patient Instructions (Addendum)
Thank you for letting us care for you today.  I am sorry you are experiencing shoulder pain.  Please apply ice packs or heat to right shoulder for comfort.  Use over the counter NSAIDS such as Aleve for discomfort as directed on the bottle. Go to the Emergency Department should you develop fever/cold chills, warmth, increased pain and/or swelling to right shoulder over the next 24-48 hours as these are signs of infection.  Please refer to the enclosed exercises for your shoulder and begin those as tolerated.  Impingement Syndrome, Rotator Cuff, Bursitis with Rehab Impingement syndrome is a condition that involves inflammation of the tendons of the rotator cuff and the subacromial bursa, that causes pain in the shoulder. The rotator cuff consists of four tendons and muscles that control much of the shoulder and upper arm function. The subacromial bursa is a fluid filled sac that helps reduce friction between the rotator cuff and one of the bones of the shoulder (acromion). Impingement syndrome is usually an overuse injury that causes swelling of the bursa (bursitis), swelling of the tendon (tendonitis), and/or a tear of the tendon (strain). Strains are classified into three categories. Grade 1 strains cause pain, but the tendon is not lengthened. Grade 2 strains include a lengthened ligament, due to the ligament being stretched or partially ruptured. With grade 2 strains there is still function, although the function may be decreased. Grade 3 strains include a complete tear of the tendon or muscle, and function is usually impaired. SYMPTOMS   Pain around the shoulder, often at the outer portion of the upper arm.  Pain that gets worse with shoulder function, especially when reaching overhead or lifting.  Sometimes, aching when not using the arm.  Pain that wakes you up at night.  Sometimes, tenderness, swelling, warmth, or redness over the affected area.  Loss of strength.  Limited motion of the  shoulder, especially reaching behind the back (to the back pocket or to unhook bra) or across your body.  Crackling sound (crepitation) when moving the arm.  Biceps tendon pain and inflammation (in the front of the shoulder). Worse when bending the elbow or lifting. CAUSES  Impingement syndrome is often an overuse injury, in which chronic (repetitive) motions cause the tendons or bursa to become inflamed. A strain occurs when a force is paced on the tendon or muscle that is greater than it can withstand. Common mechanisms of injury include: Stress from sudden increase in duration, frequency, or intensity of training.  Direct hit (trauma) to the shoulder.  Aging, erosion of the tendon with normal use.  Bony bump on shoulder (acromial spur). RISK INCREASES WITH:  Contact sports (football, wrestling, boxing).  Throwing sports (baseball, tennis, volleyball).  Weightlifting and bodybuilding.  Heavy labor.  Previous injury to the rotator cuff, including impingement.  Poor shoulder strength and flexibility.  Failure to warm up properly before activity.  Inadequate protective equipment.  Old age.  Bony bump on shoulder (acromial spur). PREVENTION   Warm up and stretch properly before activity.  Allow for adequate recovery between workouts.  Maintain physical fitness:  Strength, flexibility, and endurance.  Cardiovascular fitness.  Learn and use proper exercise technique. PROGNOSIS  If treated properly, impingement syndrome usually goes away within 6 weeks. Sometimes surgery is required.  RELATED COMPLICATIONS   Longer healing time if not properly treated, or if not given enough time to heal.  Recurring symptoms, that result in a chronic condition.  Shoulder stiffness, frozen shoulder, or loss of motion.  Rotator cuff tendon tear.  Recurring symptoms, especially if activity is resumed too soon, with overuse, with a direct blow, or when using poor  technique. TREATMENT  Treatment first involves the use of ice and medicine, to reduce pain and inflammation. The use of strengthening and stretching exercises may help reduce pain with activity. These exercises may be performed at home or with a therapist. If non-surgical treatment is unsuccessful after more than 6 months, surgery may be advised. After surgery and rehabilitation, activity is usually possible in 3 months.  MEDICATION  If pain medicine is needed, nonsteroidal anti-inflammatory medicines (aspirin and ibuprofen), or other minor pain relievers (acetaminophen), are often advised.  Do not take pain medicine for 7 days before surgery.  Prescription pain relievers may be given, if your caregiver thinks they are needed. Use only as directed and only as much as you need.  Corticosteroid injections may be given by your caregiver. These injections should be reserved for the most serious cases, because they may only be given a certain number of times. HEAT AND COLD  Cold treatment (icing) should be applied for 10 to 15 minutes every 2 to 3 hours for inflammation and pain, and immediately after activity that aggravates your symptoms. Use ice packs or an ice massage.  Heat treatment may be used before performing stretching and strengthening activities prescribed by your caregiver, physical therapist, or athletic trainer. Use a heat pack or a warm water soak. SEEK MEDICAL CARE IF:   Symptoms get worse or do not improve in 4 to 6 weeks, despite treatment.  New, unexplained symptoms develop. (Drugs used in treatment may produce side effects.) EXERCISES  RANGE OF MOTION (ROM) AND STRETCHING EXERCISES - Impingement Syndrome (Rotator Cuff  Tendinitis, Bursitis) These exercises may help you when beginning to rehabilitate your injury. Your symptoms may go away with or without further involvement from your physician, physical therapist or athletic trainer. While completing these exercises, remember:    Restoring tissue flexibility helps normal motion to return to the joints. This allows healthier, less painful movement and activity.  An effective stretch should be held for at least 30 seconds.  A stretch should never be painful. You should only feel a gentle lengthening or release in the stretched tissue. STRETCH - Flexion, Standing  Stand with good posture. With an underhand grip on your right / left hand, and an overhand grip on the opposite hand, grasp a broomstick or cane so that your hands are a little more than shoulder width apart.  Keeping your right / left elbow straight and shoulder muscles relaxed, push the stick with your opposite hand, to raise your right / left arm in front of your body and then overhead. Raise your arm until you feel a stretch in your right / left shoulder, but before you have increased shoulder pain.  Try to avoid shrugging your right / left shoulder as your arm rises, by keeping your shoulder blade tucked down and toward your mid-back spine. Hold for __________ seconds.  Slowly return to the starting position. Repeat __________ times. Complete this exercise __________ times per day. STRETCH - Abduction, Supine  Lie on your back. With an underhand grip on your right / left hand and an overhand grip on the opposite hand, grasp a broomstick or cane so that your hands are a little more than shoulder width apart.  Keeping your right / left elbow straight and your shoulder muscles relaxed, push the stick with your opposite hand, to raise your  right / left arm out to the side of your body and then overhead. Raise your arm until you feel a stretch in your right / left shoulder, but before you have increased shoulder pain.  Try to avoid shrugging your right / left shoulder as your arm rises, by keeping your shoulder blade tucked down and toward your mid-back spine. Hold for __________ seconds.  Slowly return to the starting position. Repeat __________ times.  Complete this exercise __________ times per day. ROM - Flexion, Active-Assisted  Lie on your back. You may bend your knees for comfort.  Grasp a broomstick or cane so your hands are about shoulder width apart. Your right / left hand should grip the end of the stick, so that your hand is positioned "thumbs-up," as if you were about to shake hands.  Using your healthy arm to lead, raise your right / left arm overhead, until you feel a gentle stretch in your shoulder. Hold for __________ seconds.  Use the stick to assist in returning your right / left arm to its starting position. Repeat __________ times. Complete this exercise __________ times per day.  ROM - Internal Rotation, Supine   Lie on your back on a firm surface. Place your right / left elbow about 60 degrees away from your side. Elevate your elbow with a folded towel, so that the elbow and shoulder are the same height.  Using a broomstick or cane and your strong arm, pull your right / left hand toward your body until you feel a gentle stretch, but no increase in your shoulder pain. Keep your shoulder and elbow in place throughout the exercise.  Hold for __________ seconds. Slowly return to the starting position. Repeat __________ times. Complete this exercise __________ times per day. STRETCH - Internal Rotation  Place your right / left hand behind your back, palm up.  Throw a towel or belt over your opposite shoulder. Grasp the towel with your right / left hand.  While keeping an upright posture, gently pull up on the towel, until you feel a stretch in the front of your right / left shoulder.  Avoid shrugging your right / left shoulder as your arm rises, by keeping your shoulder blade tucked down and toward your mid-back spine.  Hold for __________ seconds. Release the stretch, by lowering your healthy hand. Repeat __________ times. Complete this exercise __________ times per day. ROM - Internal Rotation   Using an underhand  grip, grasp a stick behind your back with both hands.  While standing upright with good posture, slide the stick up your back until you feel a mild stretch in the front of your shoulder.  Hold for __________ seconds. Slowly return to your starting position. Repeat __________ times. Complete this exercise __________ times per day.  STRETCH - Posterior Shoulder Capsule   Stand or sit with good posture. Grasp your right / left elbow and draw it across your chest, keeping it at the same height as your shoulder.  Pull your elbow, so your upper arm comes in closer to your chest. Pull until you feel a gentle stretch in the back of your shoulder.  Hold for __________ seconds. Repeat __________ times. Complete this exercise __________ times per day. STRENGTHENING EXERCISES - Impingement Syndrome (Rotator Cuff Tendinitis, Bursitis) These exercises may help you when beginning to rehabilitate your injury. They may resolve your symptoms with or without further involvement from your physician, physical therapist or athletic trainer. While completing these exercises, remember:  Muscles can  gain both the endurance and the strength needed for everyday activities through controlled exercises.  Complete these exercises as instructed by your physician, physical therapist or athletic trainer. Increase the resistance and repetitions only as guided.  You may experience muscle soreness or fatigue, but the pain or discomfort you are trying to eliminate should never worsen during these exercises. If this pain does get worse, stop and make sure you are following the directions exactly. If the pain is still present after adjustments, discontinue the exercise until you can discuss the trouble with your clinician.  During your recovery, avoid activity or exercises which involve actions that place your injured hand or elbow above your head or behind your back or head. These positions stress the tissues which you are trying  to heal. STRENGTH - Scapular Depression and Adduction   With good posture, sit on a firm chair. Support your arms in front of you, with pillows, arm rests, or on a table top. Have your elbows in line with the sides of your body.  Gently draw your shoulder blades down and toward your mid-back spine. Gradually increase the tension, without tensing the muscles along the top of your shoulders and the back of your neck.  Hold for __________ seconds. Slowly release the tension and relax your muscles completely before starting the next repetition.  After you have practiced this exercise, remove the arm support and complete the exercise in standing as well as sitting position. Repeat __________ times. Complete this exercise __________ times per day.  STRENGTH - Shoulder Abductors, Isometric  With good posture, stand or sit about 4-6 inches from a wall, with your right / left side facing the wall.  Bend your right / left elbow. Gently press your right / left elbow into the wall. Increase the pressure gradually, until you are pressing as hard as you can, without shrugging your shoulder or increasing any shoulder discomfort.  Hold for __________ seconds.  Release the tension slowly. Relax your shoulder muscles completely before you begin the next repetition. Repeat __________ times. Complete this exercise __________ times per day.  STRENGTH - External Rotators, Isometric  Keep your right / left elbow at your side and bend it 90 degrees.  Step into a door frame so that the outside of your right / left wrist can press against the door frame without your upper arm leaving your side.  Gently press your right / left wrist into the door frame, as if you were trying to swing the back of your hand away from your stomach. Gradually increase the tension, until you are pressing as hard as you can, without shrugging your shoulder or increasing any shoulder discomfort.  Hold for __________ seconds.  Release  the tension slowly. Relax your shoulder muscles completely before you begin the next repetition. Repeat __________ times. Complete this exercise __________ times per day.  STRENGTH - Supraspinatus   Stand or sit with good posture. Grasp a __________ weight, or an exercise band or tubing, so that your hand is "thumbs-up," like you are shaking hands.  Slowly lift your right / left arm in a "V" away from your thigh, diagonally into the space between your side and straight ahead. Lift your hand to shoulder height or as far as you can, without increasing any shoulder pain. At first, many people do not lift their hands above shoulder height.  Avoid shrugging your right / left shoulder as your arm rises, by keeping your shoulder blade tucked down and toward your  mid-back spine.  Hold for __________ seconds. Control the descent of your hand, as you slowly return to your starting position. Repeat __________ times. Complete this exercise __________ times per day.  STRENGTH - External Rotators  Secure a rubber exercise band or tubing to a fixed object (table, pole) so that it is at the same height as your right / left elbow when you are standing or sitting on a firm surface.  Stand or sit so that the secured exercise band is at your uninjured side.  Bend your right / left elbow 90 degrees. Place a folded towel or small pillow under your right / left arm, so that your elbow is a few inches away from your side.  Keeping the tension on the exercise band, pull it away from your body, as if pivoting on your elbow. Be sure to keep your body steady, so that the movement is coming only from your rotating shoulder.  Hold for __________ seconds. Release the tension in a controlled manner, as you return to the starting position. Repeat __________ times. Complete this exercise __________ times per day.  STRENGTH - Internal Rotators   Secure a rubber exercise band or tubing to a fixed object (table, pole) so that  it is at the same height as your right / left elbow when you are standing or sitting on a firm surface.  Stand or sit so that the secured exercise band is at your right / left side.  Bend your elbow 90 degrees. Place a folded towel or small pillow under your right / left arm so that your elbow is a few inches away from your side.  Keeping the tension on the exercise band, pull it across your body, toward your stomach. Be sure to keep your body steady, so that the movement is coming only from your rotating shoulder.  Hold for __________ seconds. Release the tension in a controlled manner, as you return to the starting position. Repeat __________ times. Complete this exercise __________ times per day.  STRENGTH - Scapular Protractors, Standing   Stand arms length away from a wall. Place your hands on the wall, keeping your elbows straight.  Begin by dropping your shoulder blades down and toward your mid-back spine.  To strengthen your protractors, keep your shoulder blades down, but slide them forward on your rib cage. It will feel as if you are lifting the back of your rib cage away from the wall. This is a subtle motion and can be challenging to complete. Ask your caregiver for further instruction, if you are not sure you are doing the exercise correctly.  Hold for __________ seconds. Slowly return to the starting position, resting the muscles completely before starting the next repetition. Repeat __________ times. Complete this exercise __________ times per day. STRENGTH - Scapular Protractors, Supine  Lie on your back on a firm surface. Extend your right / left arm straight into the air while holding a __________ weight in your hand.  Keeping your head and back in place, lift your shoulder off the floor.  Hold for __________ seconds. Slowly return to the starting position, and allow your muscles to relax completely before starting the next repetition. Repeat __________ times. Complete  this exercise __________ times per day. STRENGTH - Scapular Protractors, Quadruped  Get onto your hands and knees, with your shoulders directly over your hands (or as close as you can be, comfortably).  Keeping your elbows locked, lift the back of your rib cage up into your  shoulder blades, so your mid-back rounds out. Keep your neck muscles relaxed.  Hold this position for __________ seconds. Slowly return to the starting position and allow your muscles to relax completely before starting the next repetition. Repeat __________ times. Complete this exercise __________ times per day.  STRENGTH - Scapular Retractors  Secure a rubber exercise band or tubing to a fixed object (table, pole), so that it is at the height of your shoulders when you are either standing, or sitting on a firm armless chair.  With a palm down grip, grasp an end of the band in each hand. Straighten your elbows and lift your hands straight in front of you, at shoulder height. Step back, away from the secured end of the band, until it becomes tense.  Squeezing your shoulder blades together, draw your elbows back toward your sides, as you bend them. Keep your upper arms lifted away from your body throughout the exercise.  Hold for __________ seconds. Slowly ease the tension on the band, as you reverse the directions and return to the starting position. Repeat __________ times. Complete this exercise __________ times per day. STRENGTH - Shoulder Extensors   Secure a rubber exercise band or tubing to a fixed object (table, pole) so that it is at the height of your shoulders when you are either standing, or sitting on a firm armless chair.  With a thumbs-up grip, grasp an end of the band in each hand. Straighten your elbows and lift your hands straight in front of you, at shoulder height. Step back, away from the secured end of the band, until it becomes tense.  Squeezing your shoulder blades together, pull your hands down to  the sides of your thighs. Do not allow your hands to go behind you.  Hold for __________ seconds. Slowly ease the tension on the band, as you reverse the directions and return to the starting position. Repeat __________ times. Complete this exercise __________ times per day.  STRENGTH - Scapular Retractors and External Rotators   Secure a rubber exercise band or tubing to a fixed object (table, pole) so that it is at the height as your shoulders, when you are either standing, or sitting on a firm armless chair.  With a palm down grip, grasp an end of the band in each hand. Bend your elbows 90 degrees and lift your elbows to shoulder height, at your sides. Step back, away from the secured end of the band, until it becomes tense.  Squeezing your shoulder blades together, rotate your shoulders so that your upper arms and elbows remain stationary, but your fists travel upward to head height.  Hold for __________ seconds. Slowly ease the tension on the band, as you reverse the directions and return to the starting position. Repeat __________ times. Complete this exercise __________ times per day.  STRENGTH - Scapular Retractors and External Rotators, Rowing   Secure a rubber exercise band or tubing to a fixed object (table, pole) so that it is at the height of your shoulders, when you are either standing, or sitting on a firm armless chair.  With a palm down grip, grasp an end of the band in each hand. Straighten your elbows and lift your hands straight in front of you, at shoulder height. Step back, away from the secured end of the band, until it becomes tense.  Step 1: Squeeze your shoulder blades together. Bending your elbows, draw your hands to your chest, as if you are rowing a boat. At  the end of this motion, your hands and elbow should be at shoulder height and your elbows should be out to your sides.  Step 2: Rotate your shoulders, to raise your hands above your head. Your forearms should be  vertical and your upper arms should be horizontal.  Hold for __________ seconds. Slowly ease the tension on the band, as you reverse the directions and return to the starting position. Repeat __________ times. Complete this exercise __________ times per day.  STRENGTH - Scapular Depressors  Find a sturdy chair without wheels, such as a dining room chair.  Keeping your feet on the floor, and your hands on the chair arms, lift your bottom up from the seat, and lock your elbows.  Keeping your elbows straight, allow gravity to pull your body weight down. Your shoulders will rise toward your ears.  Raise your body against gravity by drawing your shoulder blades down your back, shortening the distance between your shoulders and ears. Although your feet should always maintain contact with the floor, your feet should progressively support less body weight, as you get stronger.  Hold for __________ seconds. In a controlled and slow manner, lower your body weight to begin the next repetition. Repeat __________ times. Complete this exercise __________ times per day.  Document Released: 04/05/2005 Document Revised: 06/28/2011 Document Reviewed: 07/18/2008 [facility] Patient Information 2015 [place], [place]. This information is not intended to replace advice given to you by your health care provider. Make sure you discuss any questions you have with your health care provider.

## 2015-01-07 NOTE — Progress Notes (Signed)
Patient ID: Diane Pacheco, female   DOB: 1971/01/12, 44 y.o.   MRN: [id number]   Upper Level Addendum: I have seen and evaluated this patient along with Ms. Manson Passey (NP Student) and reviewed the note below, making necessary revisions in red. My Assessment and plan can be seen in the A/P section of the problem list. Kathee Delton, MD,MS,  PGY2 01/07/2015 7:26 PM    HPI: This history was provided by the patient.  Patient states she has had chronic intermittent right shoulder pain for several years.  Patient states the pain is worse in posterior shoulder and worse with movement.  Patient states she feels pain at rest as well.  Patient states pain has grown increasingly worse over the last 2 weeks and is so severe that it prevents her from sleeping.  Patient states pain radiates into right elbow (flexor surface).  Patient states she has taken ibuprofen to treat the pain with no relief.  Patient denies numbness and tingling in neck and right shoulder and arm.  She denies injury to this area. Her job requires a significant amount of lifting/carrying items. Pain experienced w/ overhead activities. Denies feelings of instability or episodes of swelling.   ROS: See HPI. Denies erythema, rash, swelling, fevers, chills, weakness, numbness, paresthesias.  PMFSH: Rotator cuff tendonitis  PHYSICAL EXAM: BP 122/85 mmHg  Pulse 95  Temp(Src) 99 F (37.2 C) (Oral)  Wt 160 lb (72.576 kg)  LMP 12/24/2014 (Approximate) Gen: Patient is a well appearing woman who appears slightly older than stated age. Patient is alert and cooperative and in NAD. HEENT: Normocephalic, sclera white, conjunctiva pink/moist.  No rhinorrhea or otorrhea noted. Heart: S1/S2, no rub, murmur or gallop. Lungs: Coarse lung sounds throughout with mild end expiratory wheezing with auscultation.   Neuro: Alert and oriented.  EOMI.   Shoulder exam: Inspection reveals no abnormalities, atrophy or asymmetry. Palpation is normal with no  tenderness over AC joint.  Pain with palpation over bicipital groove. ROM is full in all planes. Rotator cuff strength normal throughout. No signs of impingement with negative Hawkin's tests and negative Grind.  Positive empty can. Yergason's positive. Positive Obrien's (w/ pain worse w/ thumb pointed down), negative clunk.  INJECTION: Right Subacromial Space Patient was given informed consent, signed copy in the chart. Appropriate time out was taken. Area prepped and draped in usual sterile fashion. 2 cc of methylprednisolone 40 mg/ml plus  4 cc of 1% lidocaine without epinephrine was injected into the Right subacromial space using a(n) posterior approach. The patient tolerated the procedure well. There were no complications. Post procedure instructions were given.   ASSESSMENT/PLAN: 1.  Subacromial bursitis (secondary to bicepital and rotator cuff pathology/irritation)  - Corticosteroid injection to right shoulder today in clinic  - Instruction to ice liberally.  - Rotator cuff strengthening exercises/stretches  - OTC NSAIDS as directed by manufacturer  - Follow-up in 2-3 weeks with PCP 2.  Biceps tendonitis (see above)  Next:  If pain persists or worsens, consider further imaging with MRI versus MRA.  Consider referral to Sports Medicine for intra-articular versus biceps tendon sheath injection.  Consider referral for former physical therapy.    Health maintenance:  Flu vaccine given today. Smoking cessation discussed.  Patient states she is not ready to consider cessation at this point.  FOLLOW UP: F/u in ED for signs of septic arthritis (fever/chills, heat and swelling in right shoulder). F/u in 2-3 weeks with PCP  Nelly Rout, NP Student [facility]  Upper Level A/P:  Bursitis of shoulder Findings c/w Subacromial Bursitis. Likely secondary to [facility] pathology and repetitive activities required for her work. There was no history of trauma/injury and no atrophy  or RC weakness appreciated that would lead [place] to believe RC full thickness tear. ROM preserved. At this time labral pathology can NOT be ruled out (as this may be presenting as the posterior pain). Bicipital groove tenderness and positive yergason's makes biceps tendonitis a very likely diagnosis as well. The positive O'Brien's test may not be accurate as the bicipital tendonitis can cause a false positive result.  - Corticosteroid injection to right shoulder today in clinic - Instruction to ice liberally. - Rotator cuff strengthening exercises/stretches - OTC NSAIDS as directed by manufacturer - Follow-up in 2-3 weeks with PCP  (See "Next" section at end of student's note for next-step treatment considerations )  Bicipital tendinitis See Above.   Orders Placed This Encounter  Procedures  . Flu Vaccine QUAD 36+ mos IM    Meds ordered this encounter  Medications  . methylPREDNISolone acetate (DEPO-MEDROL) injection 40 mg    Sig:      Kathee Delton, MD,MS,  PGY2 01/07/2015 7:46 PM

## 2015-03-03 ENCOUNTER — Other Ambulatory Visit: Payer: Self-pay | Admitting: *Deleted

## 2015-03-05 ENCOUNTER — Ambulatory Visit (INDEPENDENT_AMBULATORY_CARE_PROVIDER_SITE_OTHER): Payer: Medicare Other | Admitting: Family Medicine

## 2015-03-05 ENCOUNTER — Encounter: Payer: Self-pay | Admitting: Family Medicine

## 2015-03-05 VITALS — BP 126/70 | HR 80 | Temp 98.4°F | Ht 65.0 in | Wt 161.6 lb

## 2015-03-05 DIAGNOSIS — J069 Acute upper respiratory infection, unspecified: Secondary | ICD-10-CM | POA: Diagnosis present

## 2015-03-05 MED ORDER — TIOTROPIUM BROMIDE MONOHYDRATE 18 MCG IN CAPS: 18.0000 ug | ORAL_CAPSULE | Freq: Every day | RESPIRATORY_TRACT | Status: DC

## 2015-03-05 MED ORDER — ALBUTEROL SULFATE HFA 108 (90 BASE) MCG/ACT IN AERS: 1.0000 | INHALATION_SPRAY | RESPIRATORY_TRACT | Status: DC | PRN

## 2015-03-05 NOTE — Patient Instructions (Signed)
Thank you for coming to see me today. It was a pleasure. Today we talked about:   Upper respiratory infection: Please continue management as you have. I will refill your Albuterol and Spiriva but I would like you to follow-up with Dr. Waynetta SandyWight as it seems your asthma is likely not well controlled with how much you are using your albuterol.   If you have any questions or concerns, please do not hesitate to call the office at [phone].  Sincerely,  Jacquelin Hawkingalph Nettey, MD

## 2015-03-05 NOTE — Progress Notes (Signed)
    Subjective   Diane Pacheco is a 44 y.o. female that presents for a same day visit  1. Cold symptoms: Symptoms started two days ago. She has sore throat, stuffy nose, ear pain/clogged, sneezing and rhinorrhea. She has been taking Mucinex and Nyquil. No fevers, nausea or vomiting. She has had some mild wheezing. She has been using her Albuterol inhaler but ran out. Her whole household is sick with similar symptoms.   ROS Per HPI  Social History  Substance Use Topics  . Smoking status: Current Every Day Smoker -- 1.00 packs/day  . Smokeless tobacco: None  . Alcohol Use: Yes     Comment: weekly weekends per pt    No Known Allergies  Objective   BP 126/70 mmHg  Pulse 80  Temp(Src) 98.4 F (36.9 C) (Oral)  Ht 5\' 5"  (1.651 m)  Wt 161 lb 9 oz (73.284 kg)  BMI 26.89 kg/m2  SpO2 99%  LMP 02/19/2015 (Approximate)  General: Appears slightly ill, no distress HEENT: Pupils equal and reactive to light/accomodation. Extraocular movements intact bilaterally. Tympanic membranes normal bilaterally. Nares patent bilaterally. Oropharnx clear and moist. No cervical adenopathy bilaterally Respiratory/Chest: Scattered wheezes bilaterally. No accessory muscle usage Cardiovascular: Regular rate and rhythm, no murmur  Assessment and Plan   Meds ordered this encounter  Medications  . albuterol (PROVENTIL HFA;VENTOLIN HFA) 108 (90 BASE) MCG/ACT inhaler    Sig: Inhale 1-2 puffs into the lungs every 4 (four) hours as needed for wheezing.    Dispense:  2 Inhaler    Refill:  1  . tiotropium (SPIRIVA HANDIHALER) 18 MCG inhalation capsule    Sig: Place 1 capsule (18 mcg total) into inhaler and inhale daily.    Dispense:  30 capsule    Refill:  3    Upper respiratory infection  Refill albuterol and Spiriva  Conservative management  Return precautions

## 2016-01-16 ENCOUNTER — Ambulatory Visit (HOSPITAL_COMMUNITY)
Admission: EM | Admit: 2016-01-16 | Discharge: 2016-01-16 | Disposition: A | Discharge status: Home / Self Care | Payer: Medicare Other | Attending: Internal Medicine | Admitting: Internal Medicine

## 2016-01-16 ENCOUNTER — Encounter (HOSPITAL_COMMUNITY): Payer: Self-pay | Admitting: Emergency Medicine

## 2016-01-16 ENCOUNTER — Ambulatory Visit (INDEPENDENT_AMBULATORY_CARE_PROVIDER_SITE_OTHER): Payer: Medicare Other

## 2016-01-16 DIAGNOSIS — J40 Bronchitis, not specified as acute or chronic: Secondary | ICD-10-CM | POA: Diagnosis not present

## 2016-01-16 DIAGNOSIS — R0989 Other specified symptoms and signs involving the circulatory and respiratory systems: Secondary | ICD-10-CM | POA: Diagnosis not present

## 2016-01-16 MED ORDER — AZITHROMYCIN 250 MG PO TABS: 250.0000 mg | ORAL_TABLET | Freq: Every day | ORAL | 0 refills | Status: DC

## 2016-01-16 MED ORDER — HYDROCOD POLST-CPM POLST ER 10-8 MG/5ML PO SUER
5.0000 mL | Freq: Every evening | ORAL | 0 refills | Status: DC | PRN
Start: 2016-01-16 — End: 2017-06-16

## 2016-01-16 MED ORDER — IPRATROPIUM BROMIDE 0.02 % IN SOLN
RESPIRATORY_TRACT | Status: AC
  Filled 2016-01-16: qty 2.5

## 2016-01-16 MED ORDER — ALBUTEROL SULFATE (2.5 MG/3ML) 0.083% IN NEBU
INHALATION_SOLUTION | RESPIRATORY_TRACT | Status: AC
Start: 2016-01-16 — End: 2016-01-16
  Filled 2016-01-16: qty 6

## 2016-01-16 MED ORDER — ALBUTEROL SULFATE HFA 108 (90 BASE) MCG/ACT IN AERS: 1.0000 | INHALATION_SPRAY | Freq: Four times a day (QID) | RESPIRATORY_TRACT | 0 refills | Status: DC | PRN

## 2016-01-16 MED ORDER — ALBUTEROL SULFATE (2.5 MG/3ML) 0.083% IN NEBU
5.0000 mg | INHALATION_SOLUTION | Freq: Once | RESPIRATORY_TRACT | Status: AC
  Administered 2016-01-16: 5 mg via RESPIRATORY_TRACT

## 2016-01-16 MED ORDER — IPRATROPIUM BROMIDE 0.02 % IN SOLN
0.5000 mg | Freq: Once | RESPIRATORY_TRACT | Status: AC
  Administered 2016-01-16: 0.5 mg via RESPIRATORY_TRACT

## 2016-01-16 NOTE — ED Triage Notes (Signed)
Pt here for prod cough onset x1 week associated w/bilateral ear fullness and congestion  Using albuterol w/no relief  Smokes 1 PPD  A&O x4... NAD

## 2016-01-16 NOTE — ED Provider Notes (Signed)
CSN: [id number]     Arrival date & time 01/16/16  1105 History   None    Chief Complaint  Patient presents with  . URI   (Consider location/radiation/quality/duration/timing/severity/associated sxs/prior Treatment) Pt reports persistent cough s/p URI last week. Pt is a smoker (1/2-1 pk a day) and admits to a h/o recurrent Bronchitis.    Cough  Cough characteristics:  Hacking and non-productive Sputum characteristics:  Unable to specify Severity:  Severe Onset quality:  Gradual Duration:  1 week Timing:  Constant Progression:  Worsening Chronicity:  Recurrent Smoker: yes   Context: upper respiratory infection   Relieved by:  Nothing Worsened by:  Activity and deep breathing Ineffective treatments:  None tried Associated symptoms: chills, ear fullness, ear pain, fever, myalgias, rhinorrhea, shortness of breath, sinus congestion and wheezing   Associated symptoms: no chest pain and no diaphoresis   Risk factors: recent infection     Past Medical History:  Diagnosis Date  . Asthma    exacerbations w/ respiratory infxns. Well controlled w/ abuterol PRN  . Condyloma acuminatum   . COPD (chronic obstructive pulmonary disease) (HCC)   . Major depression (HCC)    Unable to take antidepressents due to intolerance. Prefers to handle w/o Rx   Past Surgical History:  Procedure Laterality Date  . TUBAL LIGATION     Family History  Problem Relation Age of Onset  . Cancer Mother    Social History  Substance Use Topics  . Smoking status: Current Every Day Smoker    Packs/day: 1.00  . Smokeless tobacco: Never Used  . Alcohol use Yes     Comment: weekly weekends per pt   OB History    No data available     Review of Systems  Constitutional: Positive for chills and fever. Negative for diaphoresis.  HENT: Positive for ear pain and rhinorrhea.   Respiratory: Positive for cough, shortness of breath and wheezing.   Cardiovascular: Negative for chest pain.  Musculoskeletal:  Positive for myalgias.  All other systems reviewed and are negative.   Allergies  Review of patient's allergies indicates no known allergies.  Home Medications   Prior to Admission medications   Medication Sig Start Date End Date Taking? Authorizing Provider  albuterol (PROVENTIL HFA;VENTOLIN HFA) 108 (90 Base) MCG/ACT inhaler Inhale 1-2 puffs into the lungs every 6 (six) hours as needed for wheezing or shortness of breath. 01/16/16   Roma Kayser Omir Cooprider, NP  azithromycin (ZITHROMAX Z-PAK) 250 MG tablet Take 1 tablet (250 mg total) by mouth daily. Take 2 tablets on day 1 then 1 tablet daily on days 2-5. 01/16/16   Leanne Chang, NP  chlorpheniramine-HYDROcodone (TUSSIONEX PENNKINETIC ER) 10-8 MG/5ML SUER Take 5 mLs by mouth at bedtime as needed for cough. 01/16/16   Roma Kayser Ajai Harville, NP  nicotine (NICODERM CQ - DOSED IN MG/24 HOURS) 21 mg/24hr patch Place 1 patch (21 mg total) onto the skin daily. 06/26/14   Tommie Sams, DO  Spacer/Aero-Holding Chambers DEVI Use with inhaler as instructed 06/19/13   Ozella Rocks, MD  tiotropium (SPIRIVA HANDIHALER) 18 MCG inhalation capsule Place 1 capsule (18 mcg total) into inhaler and inhale daily. 03/05/15   Narda Bonds, MD   Meds Ordered and Administered this Visit   Medications  albuterol (PROVENTIL) (2.5 MG/3ML) 0.083% nebulizer solution 5 mg (5 mg Nebulization Given 01/16/16 1308)  ipratropium (ATROVENT) nebulizer solution 0.5 mg (0.5 mg Nebulization Given 01/16/16 1308)    BP 128/94 (BP Location: Left Arm)  Pulse 89   Temp 99 F (37.2 C) (Oral)   Resp 12   LMP 12/26/2015   SpO2 99%  No data found.   Physical Exam  Constitutional: She is oriented to person, place, and time. She appears well-developed and well-nourished.  HENT:  Head: Normocephalic and atraumatic.  Eyes: Conjunctivae are normal.  Cardiovascular: Normal rate.   Pulmonary/Chest: Effort normal. She has wheezes.  BBS w/ diffuse inspiratory and expiratory wheezes.   Neurological: She is alert and oriented to person, place, and time.  Skin: Skin is warm and dry.  Psychiatric: She has a normal mood and affect.    Urgent Care Course   Clinical Course    Procedures (including critical care time)  Labs Review Labs Reviewed - No data to display  Imaging Review Dg Chest 2 View  Result Date: 01/16/2016 CLINICAL DATA:  45 year old female with a history of head and chest cold EXAM: CHEST  2 VIEW COMPARISON:  08/07/2012 FINDINGS: The heart size and mediastinal contours are within normal limits. Both lungs are clear. The visualized skeletal structures are unremarkable. IMPRESSION: No active cardiopulmonary disease. Signed, Yvone NeuJaime S. Loreta AveWagner, DO [facility] Electronically Signed   By: Gilmer MorJaime  Wagner D.O.   On: 01/16/2016 13:04     Visual Acuity Review  Right Eye Distance:   Left Eye Distance:   Bilateral Distance:    Right Eye Near:   Left Eye Near:    Bilateral Near:         MDM   1. Bronchitis   Air movement much improved after neb here in office. Chest xray negative for consolidations or infiltrates. Will treat for Bronchitis. Rx: Z-Pak, Albuterol HFA and short course of Tussionex cough syrup to use at night. Pt to arrange f/u w/ PCP as needed. Home care discussed and provided in print.     Leanne ChangKatherine P Midori Dado, NP 01/16/16 1758

## 2016-04-14 ENCOUNTER — Ambulatory Visit (INDEPENDENT_AMBULATORY_CARE_PROVIDER_SITE_OTHER): Payer: Medicare Other | Admitting: Internal Medicine

## 2016-04-14 ENCOUNTER — Encounter: Payer: Self-pay | Admitting: Internal Medicine

## 2016-04-14 DIAGNOSIS — L739 Follicular disorder, unspecified: Secondary | ICD-10-CM | POA: Diagnosis not present

## 2016-04-14 DIAGNOSIS — Z23 Encounter for immunization: Secondary | ICD-10-CM

## 2016-04-14 MED ORDER — ALBUTEROL SULFATE HFA 108 (90 BASE) MCG/ACT IN AERS: 1.0000 | INHALATION_SPRAY | Freq: Four times a day (QID) | RESPIRATORY_TRACT | 1 refills | Status: DC | PRN

## 2016-04-14 MED ORDER — HYDROXYZINE HCL 10 MG PO TABS: 10.0000 mg | ORAL_TABLET | Freq: Three times a day (TID) | ORAL | 0 refills | Status: DC | PRN

## 2016-04-14 MED ORDER — MUPIROCIN CALCIUM 2 % EX CREA: 1.0000 "application " | TOPICAL_CREAM | Freq: Three times a day (TID) | CUTANEOUS | 0 refills | Status: DC

## 2016-04-14 NOTE — Patient Instructions (Signed)
Apply the antibiotic cream three times a day for ten days. Please let us know if this does not clear up the rash.   Take the Atarax as needed for itching.

## 2016-04-14 NOTE — Progress Notes (Signed)
   Subjective:    Diane Pacheco - 45 y.o. female MRN [id number]  Date of birth: 01/27/1971  HPI  Diane Pacheco is here for SDA for rash.  Rash: Has been present for approximately 2 weeks. Rash is located in the inner thighs, lower back, and buttocks. Is extremely pruritic and has been worsening in intensity of itching. Denies pain associated with rash. Has been trying benadryl and hydrocortisone cream without relief. Rash started after she cleaned her apartment and was moving. Notes that she did get sweaty and was wearing jeans during most of this work. Has not been in any hot tubs or swimming pools recently. Daughter had scabies about 2-3 months ago and was treated. She notes that they washed bedding and clothing in hot water after this as well. No new medications, detergents or topical hygiene products.   -  reports that she has been smoking.  She has been smoking about 1.00 pack per day. She has never used smokeless tobacco. - Review of Systems: Per HPI. - Past Medical History: Patient Active Problem List   Diagnosis Date Noted  . Folliculitis 04/14/2016  . Bursitis of shoulder 01/07/2015  . Bicipital tendinitis 01/07/2015  . COPD with acute exacerbation (HCC) 06/26/2014  . Rash of hands 08/02/2013  . COPD, mild (HCC) 06/25/2013  . Bipolar disorder, unspecified 06/19/2013  . Rotator cuff tendonitis 06/19/2013  . Obesity 07/15/2011  . TOBACCO USER 01/25/2009   - Medications: reviewed and updated   Objective:   Physical Exam BP 118/86   Pulse 73   Temp 98.1 F (36.7 C) (Oral)   Ht 5\' 5"  (1.651 m)   Wt 160 lb 9.6 oz (72.8 kg)   LMP 04/14/2016   SpO2 99%   BMI 26.73 kg/m  Gen: NAD, alert, cooperative with exam, well-appearing Skin: Pustules and erythematous papules present over the buttocks, lower back, and inner thighs. No rash in the web spaces of fingers bilaterally.      Assessment & Plan:   Folliculitis History and exam consistent with folliculitis. Will  treat with topical Bactroban x10 days. Rx for Atarax given as patient has continued to have intense itching despite use of Benadryl. Patient to follow up if rash does not improve with treatment.     Marcy Sirenatherine Stavroula Rohde, D.O. 04/14/2016, 2:07 PM PGY-2, [facility]

## 2016-04-14 NOTE — Assessment & Plan Note (Signed)
History and exam consistent with folliculitis. Will treat with topical Bactroban x10 days. Rx for Atarax given as patient has continued to have intense itching despite use of Benadryl. Patient to follow up if rash does not improve with treatment.

## 2016-04-15 ENCOUNTER — Telehealth: Payer: Self-pay | Admitting: Internal Medicine

## 2016-04-15 NOTE — Telephone Encounter (Signed)
[facility] 2% cream.  Medication is not covered and will cost patient $262.31.  List of preferred medications: 1. gentamycin sulfate cream/ointment 2. Mupirocin ointment 3. SSD cream 4. Silver Sulfadiazine Cream   Clovis PuMartin, Tamika L, RN

## 2016-04-15 NOTE — Telephone Encounter (Signed)
Pt was unable to get cream filled yesterday, insurance did not cover it. Pt would like for us to call something else in. Pt states she is itching to death. Thanks! ep

## 2016-04-16 MED ORDER — MUPIROCIN 2 % EX OINT: 1.0000 "application " | TOPICAL_OINTMENT | Freq: Three times a day (TID) | CUTANEOUS | 0 refills | Status: DC

## 2016-04-16 NOTE — Telephone Encounter (Signed)
2nd request. Pt states she is itching to death and needs something today. ep

## 2016-04-16 NOTE — Telephone Encounter (Signed)
I have sent mupirocin ointment to her pharmacy which was on the preferred list of medications. Please call patient to inform.   Marcy Sirenatherine Shinita Mac, D.O. 04/16/2016, 10:26 AM PGY-2, [facility]

## 2016-04-16 NOTE — Telephone Encounter (Signed)
Pt informed. Zimmerman Rumple, April D, CMA  

## 2016-04-25 NOTE — Progress Notes (Signed)
[facility] Phone: [phone]   Date of Visit: 04/27/2016   HPI:  Patient presents today for a well woman exam.   Concerns today: Rash. Patient was seen in clinic on 04/14/16 for rash that was thought to be consistent with folliculitis. She was prescribed Bactroban cream and Atarax 10mg  TID PRN. These have not helped. The rash initially started about 5 weeks ago and was only on her lower back and buttock region. This started when she was cleaning her apartment to move to her house. Since she was seen in clinic the rash has spread to her extremities and her trunk. No rash on her palms or soles or face. Reports that her husband "just got 3 bumps on his left forearm but I am not sure if it's the same". Her daughter does not have a rash.   Chest palpitations:  - occurs maybe once a month and lasts a few minutes. Occurs randomly  - reports this is possibly due to anxiety - started in the past 6-8 months; only had 4-5 episodes  - no chest pain,sob,n/v, or diaphoresis - usually resolves with deep breathing exercises - no lightheadedness/dizzy  Also mentioned issues with urinary incontinence with coughing which we did not have time to discuss.    Periods: regular periods; every month; last 3 days; no intermenstrual bleeding Contraception: tubal ligation Pelvic symptoms: no abnormal bleeding, no vaginal discharge, no vaginal itching  Sexual activity: yes STD Screening: no history; declines Pap smear status: last pap 06/2011 with normal cytology. No abnormal paps in the past per patient  Exercise: not regularly; cleaning house Diet: fried foods TID; no fast food; no sweets Smoking: 1ppd for 29 years; no thoughts of quitting due to anxiety  Alcohol: 1-2 drinks 2-3 times a week (drink is 12oz beer) Drugs: marijuana currently for anxeity; history of cocaine use (11 years ago)  Advance directives:  Mood: his history of bipolar disorder; used to go to a specialist; not  interested in medication. PHQ9 6. No SI/HI. No symptoms of mania  Dentist: lost her dentures; does not see a dentist regularly  ROS: See HPI  PMFSH:  Cancers in family: breast cancer maternal and paternal grandmothers; around 80yo for both  Mother in her late 49s MI; mother breast cancer 73yo Father is healthy  Siblings healthy  PMH:  COPD Rotator Cuff Tendonitis Bipolar DO Obesity Tobacco Use  PHYSICAL EXAM: BP 120/60   Pulse 77   Temp 98 F (36.7 C) (Oral)   Ht 5\' 5"  (1.651 m)   Wt 157 lb (71.2 kg)   LMP 04/14/2016   SpO2 99%   BMI 26.13 kg/m  Gen: NAD, pleasant, cooperative HEENT: NCAT, PERRL, no palpable thyromegaly or anterior cervical lymphadenopathy Heart: RRR, no murmurs Lungs: CTAB, NWOB Abdomen: soft, nontender to palpation Neuro: grossly nonfocal, speech normal GU: normal appearing external genitalia without lesions. Vagina is moist with white discharge. Cervix normal in appearance. No cervical motion tenderness or tenderness on bimanual exam. No adnexal masses.   ASSESSMENT/PLAN:  # Health maintenance:  -STD screening: declined -pap smear: today  -mammogram: information given -lipid screening: future order. Patient to make lab visit and come in fasting. Will also screen for DM (asymptomatic) with A1c -immunizations: need to discuss the need for tetanus and HIV screen at next visit in 1 month  Intermittent Palpitations:  Likely anxiety related. No current palpitations. Patient denied chest pain. Risk factors include smoking. Will screen for HLD and DM2. Family history of  premature CAD (mother MI in her 8140s). Will refer to cardiology. Discussed with preceptor.  Rash:  Consistent with bed bugs. Less likely folliculitis or scabies.  Hydrocortisone cream BID for 1-2 weeks. Zyrtec daily for pruritis. Atarax 25mg  TID PRN. Discussed cleaning of home. Discussed with Preceptor  History of Bipolar DO; History of anxiety and depression:  No symptoms of mania  noted. Patient does not want to be on pharmacotherapy. No SI/HI. Is interested in speaking with Behavioral Health. I [facility] but [facility] to [facility].   FOLLOW UP: Follow up in 1 month  Palma HolterKanishka G Gunadasa, MD PGY 2 [facility]

## 2016-04-26 ENCOUNTER — Telehealth: Payer: Self-pay | Admitting: Internal Medicine

## 2016-04-26 MED ORDER — MUPIROCIN CALCIUM 2 % EX CREA: 1.0000 "application " | TOPICAL_CREAM | Freq: Three times a day (TID) | CUTANEOUS | 0 refills | Status: DC

## 2016-04-26 NOTE — Telephone Encounter (Signed)
Pt is calling because she out of the cream that she was given for her rash. This has now spread to other places on her body. Can we send in some medication tonight. She does have an appointment for tomorrow but this itching is driving her crazy. jw

## 2016-04-26 NOTE — Telephone Encounter (Signed)
Called patient to discuss as I have not met her yet.  She was seen in clinic by another provider on 04/14/16 ad was diagnosed with Folliculitis.  She reports she continues to have itching and the rash seems to have spread. Denies fevers. She has run out of Bactroban cream. Is using Atarax but not really helping. She is requesting refill of bactroban until she sees me tomorrow in clinic. Will send refill to pharmacy. Patient to see me in clinic tomorrow afternoon.

## 2016-04-27 ENCOUNTER — Encounter: Payer: Self-pay | Admitting: Internal Medicine

## 2016-04-27 ENCOUNTER — Ambulatory Visit (INDEPENDENT_AMBULATORY_CARE_PROVIDER_SITE_OTHER): Payer: Medicare Other | Admitting: Internal Medicine

## 2016-04-27 ENCOUNTER — Other Ambulatory Visit (HOSPITAL_COMMUNITY)
Admission: RE | Admit: 2016-04-27 | Discharge: 2016-04-27 | Disposition: A | Discharge status: Home / Self Care | Payer: Medicare Other | Source: Ambulatory Visit | Attending: Family Medicine | Admitting: Family Medicine

## 2016-04-27 VITALS — BP 120/60 | HR 77 | Temp 98.0°F | Ht 65.0 in | Wt 157.0 lb

## 2016-04-27 DIAGNOSIS — E7800 Pure hypercholesterolemia, unspecified: Secondary | ICD-10-CM | POA: Diagnosis not present

## 2016-04-27 DIAGNOSIS — W57XXXS Bitten or stung by nonvenomous insect and other nonvenomous arthropods, sequela: Secondary | ICD-10-CM

## 2016-04-27 DIAGNOSIS — Z01419 Encounter for gynecological examination (general) (routine) without abnormal findings: Secondary | ICD-10-CM | POA: Insufficient documentation

## 2016-04-27 DIAGNOSIS — R002 Palpitations: Secondary | ICD-10-CM | POA: Diagnosis not present

## 2016-04-27 DIAGNOSIS — Z8249 Family history of ischemic heart disease and other diseases of the circulatory system: Secondary | ICD-10-CM

## 2016-04-27 DIAGNOSIS — Z124 Encounter for screening for malignant neoplasm of cervix: Secondary | ICD-10-CM

## 2016-04-27 DIAGNOSIS — Z1151 Encounter for screening for human papillomavirus (HPV): Secondary | ICD-10-CM | POA: Insufficient documentation

## 2016-04-27 DIAGNOSIS — R7309 Other abnormal glucose: Secondary | ICD-10-CM | POA: Diagnosis not present

## 2016-04-27 MED ORDER — HYDROXYZINE HCL 25 MG PO TABS: 25.0000 mg | ORAL_TABLET | Freq: Three times a day (TID) | ORAL | 0 refills | Status: DC | PRN

## 2016-04-27 MED ORDER — CETIRIZINE HCL 10 MG PO TABS: 10.0000 mg | ORAL_TABLET | Freq: Every day | ORAL | 11 refills | Status: DC

## 2016-04-27 MED ORDER — HYDROCORTISONE 2.5 % EX CREA: TOPICAL_CREAM | Freq: Two times a day (BID) | CUTANEOUS | 0 refills | Status: DC

## 2016-04-27 NOTE — Patient Instructions (Addendum)
Your symptoms might be due to bed bugs; please note the information below. Please take Zyrtec daily to help with itching. You can take atarax 25mg  three times a day as needed; this medication may make you sleepy. You can use hydrocortrisone cream twice a day on your skin; do not use on your face or the creases of your skin.   I made a referral to cardiology.    Bedbugs Introduction Bedbugs are tiny bugs that live in and around beds. During the day, they stay hidden. At night, they come out and bite. Where are bedbugs found? Bedbugs can be found anywhere. It does not matter if a place is clean or dirty. They are often found in:  Hotels.  Shelters.  Dorms.  Hospitals.  Nursing homes.  Places where there are many birds or bats. What are bedbug bites like? A bedbug bite leaves a small red bump with a darker red dot in the middle. The bump may show up soon after a person is bitten or a day or more later. Bedbug bites usually do not hurt, but they may itch. Most people do not need treatment for bedbug bites. The bumps usually go away on their own in a few days. How do I check for bedbugs? Bedbugs are reddish-brown, oval, and flat. They are very small and they cannot fly. Look for bedbugs in these places:  On mattresses, bed frames, headboards, and box springs.  On drapes and curtains in bedrooms.  Under the carpet in bedrooms.  Behind electrical outlets.  Behind any wallpaper that is peeling.  Inside luggage. Also look for black or red spots or stains on or near the bed. What should I do if I find bedbugs? When Traveling  Check your clothes, suitcase, and belongings for bedbugs before you go back home. You may want to throw away anything that has bedbugs on it. At Home  Your bedroom may need to be treated by a pest control expert. You may also need to throw away mattresses or luggage. To help keep bedbugs from coming back, you may want to:  Put a plastic cover over your  mattress.  Wash your clothes and bedding in water that is hotter than 120F (48.9C). Dry them on a hot setting.  Vacuum often around the bed and in all of the cracks where the bugs might hide.  Check all used furniture, bedding, or clothes that you bring into your home.  Get rid of bird nests and bat roosts that are near your home. In Your Bed  Try wearing pajamas that have long sleeves and pant legs. Bedbugs usually bite areas of the skin that are not covered. This information is not intended to replace advice given to you by your health care provider. Make sure you discuss any questions you have with your health care provider. Document Released: 07/21/2010 Document Revised: 09/11/2015 Document Reviewed: 04/01/2014  2017 Elsevier

## 2016-04-28 LAB — CYTOLOGY - PAP
DIAGNOSIS: NEGATIVE
HPV: NOT DETECTED

## 2016-04-29 ENCOUNTER — Other Ambulatory Visit: Payer: Self-pay | Admitting: Internal Medicine

## 2016-04-29 ENCOUNTER — Telehealth: Payer: Self-pay | Admitting: *Deleted

## 2016-04-29 ENCOUNTER — Encounter: Payer: Self-pay | Admitting: Internal Medicine

## 2016-04-29 DIAGNOSIS — Z1231 Encounter for screening mammogram for malignant neoplasm of breast: Secondary | ICD-10-CM

## 2016-04-29 NOTE — Progress Notes (Signed)
Sent letter with normal pap results  

## 2016-04-29 NOTE — Telephone Encounter (Signed)
Prior Authorization received from [facility] for mupirocin. PA was approved via [employer] valid dates: 01/30/16-04/29/17. Clovis PuMartin, Ankush Gintz L, RN

## 2016-04-30 ENCOUNTER — Telehealth: Payer: Self-pay | Admitting: Licensed Clinical Social Worker

## 2016-04-30 NOTE — Progress Notes (Addendum)
Social work consult from Dr. Ottie GlazierGunadasa, to follow up with patient refer anxiety. Called patient reference the above consult.  Patient reports she has a 20-year history of Bipolar disorder.  Previously followed by Psychiatry in [place] [facility].  Last seen there about 1 year ago.  Patient states she is doing well with managing her anxiety and depression.  Does not need any assistance nor interested in talking to anyone at this time.  LCSW discussed community options and patient is aware of available resources.   No LCSW services needed at this time.  Provided update to PCP.  Diane Hineseborah Chuckie Mccathern, LCSW Licensed Clinical Social Worker [facility]   [phone] 2:20 PM

## 2016-05-05 ENCOUNTER — Other Ambulatory Visit: Payer: Medicare Other

## 2016-05-06 ENCOUNTER — Other Ambulatory Visit: Payer: Medicare Other

## 2016-05-10 ENCOUNTER — Ambulatory Visit
Admission: RE | Admit: 2016-05-10 | Discharge: 2016-05-10 | Disposition: A | Discharge status: Home / Self Care | Payer: Medicare Other | Source: Ambulatory Visit | Attending: Family Medicine | Admitting: Family Medicine

## 2016-05-10 ENCOUNTER — Other Ambulatory Visit (INDEPENDENT_AMBULATORY_CARE_PROVIDER_SITE_OTHER): Payer: Medicare Other

## 2016-05-10 DIAGNOSIS — E7800 Pure hypercholesterolemia, unspecified: Secondary | ICD-10-CM

## 2016-05-10 DIAGNOSIS — Z8249 Family history of ischemic heart disease and other diseases of the circulatory system: Secondary | ICD-10-CM

## 2016-05-10 DIAGNOSIS — Z1231 Encounter for screening mammogram for malignant neoplasm of breast: Secondary | ICD-10-CM

## 2016-05-10 DIAGNOSIS — R7309 Other abnormal glucose: Secondary | ICD-10-CM | POA: Diagnosis present

## 2016-05-10 LAB — LIPID PANEL
Cholesterol: 211 mg/dL — ABNORMAL HIGH (ref <200)
HDL: 32 mg/dL — AB (ref >50)
LDL CALC: 116 mg/dL — AB (ref <100)
Total CHOL/HDL Ratio: 6.6 Ratio — ABNORMAL HIGH (ref <5.0)
Triglycerides: 317 mg/dL — ABNORMAL HIGH (ref <150)
VLDL: 63 mg/dL — AB (ref <30)

## 2016-05-10 LAB — POCT GLYCOSYLATED HEMOGLOBIN (HGB A1C): Hemoglobin A1C: 4.9

## 2016-05-14 ENCOUNTER — Telehealth: Payer: Self-pay | Admitting: Internal Medicine

## 2016-05-14 MED ORDER — ATORVASTATIN CALCIUM 20 MG PO TABS
20.0000 mg | ORAL_TABLET | Freq: Every day | ORAL | 0 refills | Status: DC
Start: 2016-05-14 — End: 2016-10-16

## 2016-05-14 NOTE — Telephone Encounter (Signed)
Discussed lipid panel. With family history of early CVD, will start moderate intensity statin. Patient agreeable. Will order LFTs as future order. Patient to make lab visit.

## 2016-05-26 ENCOUNTER — Other Ambulatory Visit: Payer: Medicare Other

## 2016-05-26 ENCOUNTER — Other Ambulatory Visit: Payer: Self-pay | Admitting: Internal Medicine

## 2016-05-26 DIAGNOSIS — E782 Mixed hyperlipidemia: Secondary | ICD-10-CM | POA: Diagnosis not present

## 2016-05-26 LAB — HEPATIC FUNCTION PANEL
ALBUMIN: 4.2 g/dL (ref 3.6–5.1)
ALK PHOS: 59 U/L (ref 33–115)
ALT: 17 U/L (ref 6–29)
AST: 21 U/L (ref 10–30)
BILIRUBIN TOTAL: 0.3 mg/dL (ref 0.2–1.2)
Bilirubin, Direct: 0.1 mg/dL (ref <=0.2)
Indirect Bilirubin: 0.2 mg/dL (ref 0.2–1.2)
Total Protein: 6.6 g/dL (ref 6.1–8.1)

## 2016-05-26 NOTE — Progress Notes (Signed)
   [facility] Phone: [phone]   Date of Visit: 05/27/2016   HPI:  Rash:  - initially seen in clinic on 04/14/16 for rash that was thought to be consistent with folliculitis. Was prescribed Bactroban and Atarax.  - Seen again in clinic on 04/27/16 for rash without improvement. Thought rash was consistent with bed bugs.    - patient returns today. Reports that she had an exterminator come to her house to clean the home x 2. She cleaned her linens and clothes. Taking zyrtec daily. Atarax as prescribed. Using hydrocortisone cream without improvement. Rash is still itchy and has spread throughout her body but not face, palms or soles.  Reports her husband has the same symptoms.  - has also been using neosporin as she does not want to get a secondary infection.   ROS: See HPI.  PMFSH:  PMH:  COPD Rotator Cuff Tendonitis Bipolar DO Obesity Tobacco Use  PHYSICAL EXAM: BP 116/60   Pulse 97   Temp 98.5 F (36.9 C) (Oral)   Ht 5\' 5"  (1.651 m)   Wt 160 lb (72.6 kg)   BMI 26.63 kg/m  Gen: NAD, pleasant, cooperative Skin: multiple small erythematous papules on the trunk (anterior > posterior) and all extremities, burrow like lesions on the webs of the fingers. No lesions on the palms or face. Excoriations noted. Most consistent with No secondary infection noted.   ASSESSMENT/PLAN:  1. Scabies Lesions likely consistent with scabies - Permethrin (provided 60g for patient and her husband) - discussed cleaning home again - Hydrocortisone cream BID x 1 week, Zyrtec daily, Atarax PRN for pruritis.   FOLLOW UP: Follow up PRN if symptoms do not improve  Palma HolterKanishka G Gunadasa, MD PGY 2 [facility]

## 2016-05-26 NOTE — Progress Notes (Signed)
Ordered LFTs (not placed earlier with phone note)

## 2016-05-27 ENCOUNTER — Ambulatory Visit (INDEPENDENT_AMBULATORY_CARE_PROVIDER_SITE_OTHER): Payer: Medicare Other | Admitting: Internal Medicine

## 2016-05-27 VITALS — BP 116/60 | HR 97 | Temp 98.5°F | Ht 65.0 in | Wt 160.0 lb

## 2016-05-27 DIAGNOSIS — B86 Scabies: Secondary | ICD-10-CM

## 2016-05-27 DIAGNOSIS — E785 Hyperlipidemia, unspecified: Secondary | ICD-10-CM | POA: Insufficient documentation

## 2016-05-27 MED ORDER — HYDROCORTISONE 2.5 % EX CREA: TOPICAL_CREAM | Freq: Two times a day (BID) | CUTANEOUS | 0 refills | Status: DC

## 2016-05-27 MED ORDER — PERMETHRIN 5 % EX CREA: 1.0000 "application " | TOPICAL_CREAM | Freq: Once | CUTANEOUS | 0 refills | Status: AC

## 2016-05-27 MED ORDER — HYDROXYZINE HCL 25 MG PO TABS: 25.0000 mg | ORAL_TABLET | Freq: Three times a day (TID) | ORAL | 0 refills | Status: DC | PRN

## 2016-05-27 NOTE — Patient Instructions (Signed)
Scabies, Adult Introduction Scabies is a skin condition that happens when very small insects get under the skin (infestation). This causes a rash and severe itchiness. Scabies can spread from person to person (is contagious). If you get scabies, it is common for others in your household to get scabies too. With proper treatment, symptoms usually go away in 2-4 weeks. Scabies usually does not cause lasting problems. What are the causes? This condition is caused by mites (Sarcoptes scabiei, or human itch mites) that can only be seen with a microscope. The mites get into the top layer of skin and lay eggs. Scabies can spread from person to person through:  Close contact with a person who has scabies.  Contact with infested items, such as towels, bedding, or clothing. What increases the risk? This condition is more likely to develop in:  People who live in nursing homes and other extended-care facilities.  People who have sexual contact with a partner who has scabies.  Young children who attend child care facilities.  People who care for others who are at increased risk for scabies. What are the signs or symptoms? Symptoms of this condition may include:  Severe itchiness. This is often worse at night.  A rash that includes tiny red bumps or blisters. The rash commonly occurs on the wrist, elbow, armpit, fingers, waist, groin, or buttocks. Bumps may form a line (burrow) in some areas.  Skin irritation. This can include scaly patches or sores. How is this diagnosed? This condition is diagnosed with a physical exam. Your health care provider will look closely at your skin. In some cases, your health care provider may take a sample of your affected skin (skin scraping) and have it examined under a microscope. How is this treated? This condition may be treated with:  Medicated cream or lotion that kills the mites. This is spread on the entire body and left on for several hours. Usually, one  treatment with medicated cream or lotion is enough to kill all of the mites. In severe cases, the treatment may be repeated.  Medicated cream that relieves itching.  Medicines that help to relieve itching.  Medicines that kill the mites. This treatment is rarely used. Follow these instructions at home:   Medicines  Take or apply over-the-counter and prescription medicines as told by your health care provider.  Apply medicated cream or lotion as told by your health care provider.  Do not wash off the medicated cream or lotion until the necessary amount of time has passed. Skin Care  Avoid scratching your affected skin.  Keep your fingernails closely trimmed to reduce injury from scratching.  Take cool baths or apply cool washcloths to help reduce itching. General instructions  Clean all items that you recently had contact with, including bedding, clothing, and furniture. Do this on the same day that your treatment starts.  Use hot water when you wash items.  Place unwashable items into closed, airtight plastic bags for at least 3 days. The mites cannot live for more than 3 days away from human skin.  Vacuum furniture and mattresses that you use.  Make sure that other people who may have been infested are examined by a health care provider. These include members of your household and anyone who may have had contact with infested items.  Keep all follow-up visits as told by your health care provider. This is important. Contact a health care provider if:  You have itching that does not go away after 4   weeks of treatment.  You continue to develop new bumps or burrows.  You have redness, swelling, or pain in your rash area after treatment.  You have fluid, blood, or pus coming from your rash. This information is not intended to replace advice given to you by your health care provider. Make sure you discuss any questions you have with your health care provider. Document  Released: 12/25/2014 Document Revised: 09/11/2015 Document Reviewed: 11/05/2014  2017 Elsevier  

## 2016-06-04 NOTE — Progress Notes (Deleted)
Cardiology Office Note   Date:  06/04/2016   ID:  Diane SchilderChelsea B Pacheco, DOB 09/01/1970, MRN [id number]  PCP:  Diane HolterKanishka G Gunadasa, MD  Cardiologist:   Charlton HawsPeter Toy Samarin, MD   No chief complaint on file.     History of Present Illness: Diane Pacheco is a 46 y.o. female who presents for palpitations. Likely related to anxiety. History of anxiety and psychiatric care Mother had MI in her 4940's  She was started on statin for elevated lipids recently  She has been dealing with rash/scabies over the last couple of months      Past Medical History:  Diagnosis Date  . Asthma    exacerbations w/ respiratory infxns. Well controlled w/ abuterol PRN  . Condyloma acuminatum   . COPD (chronic obstructive pulmonary disease) (HCC)   . Major depression    Unable to take antidepressents due to intolerance. Prefers to handle w/o Rx    Past Surgical History:  Procedure Laterality Date  . TUBAL LIGATION       Current Outpatient Prescriptions  Medication Sig Dispense Refill  . albuterol (PROVENTIL HFA;VENTOLIN HFA) 108 (90 Base) MCG/ACT inhaler Inhale 1-2 puffs into the lungs every 6 (six) hours as needed for wheezing or shortness of breath. 1 Inhaler 1  . atorvastatin (LIPITOR) 20 MG tablet Take 1 tablet (20 mg total) by mouth daily. 90 tablet 0  . azithromycin (ZITHROMAX Z-PAK) 250 MG tablet Take 1 tablet (250 mg total) by mouth daily. Take 2 tablets on day 1 then 1 tablet daily on days 2-5. 6 tablet 0  . cetirizine (ZYRTEC) 10 MG tablet Take 1 tablet (10 mg total) by mouth daily. 30 tablet 11  . chlorpheniramine-HYDROcodone (TUSSIONEX PENNKINETIC ER) 10-8 MG/5ML SUER Take 5 mLs by mouth at bedtime as needed for cough. 75 mL 0  . hydrocortisone 2.5 % cream Apply topically 2 (two) times daily. For 1-2 weeks. 30 g 0  . hydrOXYzine (ATARAX/VISTARIL) 25 MG tablet Take 1 tablet (25 mg total) by mouth 3 (three) times daily as needed for itching. 30 tablet 0  . mupirocin cream (BACTROBAN) 2 %  Apply 1 application topically 3 (three) times daily. Apply for ten days. 30 g 0  . nicotine (NICODERM CQ - DOSED IN MG/24 HOURS) 21 mg/24hr patch Place 1 patch (21 mg total) onto the skin daily. 28 patch 0  . Spacer/Aero-Holding Rudean Curthambers DEVI Use with inhaler as instructed 2 each 1  . tiotropium (SPIRIVA HANDIHALER) 18 MCG inhalation capsule Place 1 capsule (18 mcg total) into inhaler and inhale daily. 30 capsule 3   No current facility-administered medications for this visit.     Allergies:   Patient has no known allergies.    Social History:  The patient  reports that she has been smoking.  She has been smoking about 1.00 pack per day. She has never used smokeless tobacco. She reports that she drinks alcohol. She reports that she uses drugs, including Marijuana.   Family History:  The patient's family history includes Breast cancer in her maternal grandmother and paternal grandmother; Cancer in her mother; Healthy in her father; Heart attack in her mother.    ROS:  Please see the history of present illness.   Otherwise, review of systems are positive for none.   All other systems are reviewed and negative.    PHYSICAL EXAM: VS:  There were no vitals taken for this visit. , BMI There is no height or weight on file to calculate BMI.  Affect appropriate Healthy:  appears stated age HEENT: normal Neck supple with no adenopathy JVP normal no bruits no thyromegaly Lungs clear with no wheezing and good diaphragmatic motion Heart:  S1/S2 no murmur, no rub, gallop or click PMI normal Abdomen: benighn, BS positve, no tenderness, no AAA no bruit.  No HSM or HJR Distal pulses intact with no bruits No edema Neuro non-focal Skin warm and dry No muscular weakness    EKG:   2015 SR normal ECG    Recent Labs: 05/26/2016: ALT 17    Lipid Panel    Component Value Date/Time   CHOL 211 (H) 05/10/2016 1014   TRIG 317 (H) 05/10/2016 1014   HDL 32 (L) 05/10/2016 1014   CHOLHDL 6.6 (H)  05/10/2016 1014   VLDL 63 (H) 05/10/2016 1014   LDLCALC 116 (H) 05/10/2016 1014   LDLDIRECT 110 (H) 07/15/2011 1458      Wt Readings from Last 3 Encounters:  05/27/16 160 lb (72.6 kg)  04/27/16 157 lb (71.2 kg)  04/14/16 160 lb 9.6 oz (72.8 kg)      Other studies Reviewed: Additional studies/ records that were reviewed today include: notes family practice ECG 2015 .labs 05/10/16 cholesterol     ASSESSMENT AND PLAN:  1. Palpitations 2. Cholesterol 3. Anxiety/Bipolar 4. Rash/Scabies   Current medicines are reviewed at length with the patient today.  The patient does not have concerns regarding medicines.  The following changes have been made:  no change  Labs/ tests ordered today include: *** No orders of the defined types were placed in this encounter.    Disposition:   FU with cardiology PRN      Signed, Charlton Haws, MD  06/04/2016 3:36 PM    [facility] [address] [place], [place], [place]  [address] Phone: [phone]; Fax: [phone]

## 2016-06-08 ENCOUNTER — Telehealth: Payer: Self-pay | Admitting: Internal Medicine

## 2016-06-08 DIAGNOSIS — R21 Rash and other nonspecific skin eruption: Secondary | ICD-10-CM

## 2016-06-08 NOTE — Telephone Encounter (Signed)
Pt called because she has been seen at least three times in our clinic for rash, bites,ect. She has been told three different things and she is still broke out and it is not getting better. She would like to have a referral to a dermatologist so she can finally figure this out. Myriam Jacobsonjw

## 2016-06-08 NOTE — Telephone Encounter (Signed)
Will forward to MD to place dermatology referral. Diane HawthorneJazmin Cheney Pacheco,CMA

## 2016-06-08 NOTE — Telephone Encounter (Signed)
Referral made.  Please let patient know.

## 2016-06-09 NOTE — Telephone Encounter (Signed)
Patient is aware that referral was placed and it could be at least a week or so before she heard anything about an appointment and that sometimes it could be a month out before she is seen by them.  She voiced understanding. Nyheem Binette,CMA

## 2016-06-10 ENCOUNTER — Telehealth: Payer: Self-pay

## 2016-06-10 DIAGNOSIS — B86 Scabies: Secondary | ICD-10-CM | POA: Diagnosis not present

## 2016-06-10 NOTE — Telephone Encounter (Signed)
Called patient and cancelled patient's appointment. Informed patient that Dr. Eden EmmsNishan would like her rash to be cleared up before she comes into the office for a visit. Patient stated she has an appointment with the dermatologist and will give our office a call when her rash has cleared up.

## 2016-06-15 ENCOUNTER — Ambulatory Visit: Payer: Medicare Other | Admitting: Cardiovascular Disease

## 2016-07-09 DIAGNOSIS — L2089 Other atopic dermatitis: Secondary | ICD-10-CM | POA: Diagnosis not present

## 2016-09-24 DIAGNOSIS — L2089 Other atopic dermatitis: Secondary | ICD-10-CM | POA: Diagnosis not present

## 2016-10-16 ENCOUNTER — Other Ambulatory Visit: Payer: Self-pay | Admitting: Internal Medicine

## 2017-05-03 ENCOUNTER — Other Ambulatory Visit: Payer: Self-pay | Admitting: Family Medicine

## 2017-05-03 DIAGNOSIS — Z1231 Encounter for screening mammogram for malignant neoplasm of breast: Secondary | ICD-10-CM

## 2017-05-20 ENCOUNTER — Ambulatory Visit
Admission: RE | Admit: 2017-05-20 | Discharge: 2017-05-20 | Disposition: A | Discharge status: Home / Self Care | Payer: Medicare Other | Source: Ambulatory Visit | Attending: Family Medicine | Admitting: Family Medicine

## 2017-05-20 DIAGNOSIS — Z1231 Encounter for screening mammogram for malignant neoplasm of breast: Secondary | ICD-10-CM

## 2017-06-14 NOTE — Progress Notes (Signed)
   [facility] Phone: [phone]   Date of Visit: 06/16/2017   HPI:  COPD:  - currently only on albuterol PRN - reports she uses albuterol once a day in the morning after she wakes up because she usually has a cough  - was on spiriva but this makes her jittery  - spirometry done in 2015 which showed mild to moderate COPD with GOLD A/B at that time - reports her symptoms have not worsened since her last spirometry - per chart review, no exacerbations in the past year  - CAT score of 7 - currently smokes 1 pack every few days. Not interested in quitting.    HLD: - misses Lipitor doses. Takes it 4 times a week  - does watch her diet  - does not exercise regularly   ROS: See HPI.  PMFSH:  PMH:  COPD  Tobacco Use   Obesity  HLD  Bipolar DO   PHYSICAL EXAM: BP 110/80   Pulse 81   Temp 98.2 F (36.8 C) (Oral)   Wt 157 lb (71.2 kg)   LMP 05/23/2017   SpO2 99%   BMI 26.13 kg/m  GEN: NAD HEENT:  EOMI, sclera clear  CV: RRR, no murmurs, rubs, or gallops PULM: CTAB, normal effort ABD: Soft, nontender, nondistended, NABS, no organomegaly SKIN: No rash or cyanosis; warm and well-perfused EXTR: No lower extremity edema or calf tenderness PSYCH: Mood and affect euthymic, normal rate and volume of speech NEURO: Awake, alert, no focal deficits grossly, normal speech  ASSESSMENT/PLAN:  Health Maintenance:  - Tdap vaccine sent to pharmacy   COPD, mild Symptoms seem controlled without spiriva. Will continue with PRN Albuterol for now. If symptoms worsen consider repeat spirometry since last was done 2015.   Hyperlipidemia Lipid panel today. Encouraged compliance with Statin.   TOBACCO USER Not interested in quitting currently.    Palma HolterKanishka G Gunadasa, MD PGY 3 [facility]

## 2017-06-16 ENCOUNTER — Encounter: Payer: Self-pay | Admitting: Internal Medicine

## 2017-06-16 ENCOUNTER — Ambulatory Visit (INDEPENDENT_AMBULATORY_CARE_PROVIDER_SITE_OTHER): Payer: Medicare Other | Admitting: Internal Medicine

## 2017-06-16 ENCOUNTER — Other Ambulatory Visit: Payer: Self-pay

## 2017-06-16 VITALS — BP 110/80 | HR 81 | Temp 98.2°F | Wt 157.0 lb

## 2017-06-16 DIAGNOSIS — J449 Chronic obstructive pulmonary disease, unspecified: Secondary | ICD-10-CM

## 2017-06-16 DIAGNOSIS — R7309 Other abnormal glucose: Secondary | ICD-10-CM

## 2017-06-16 DIAGNOSIS — Z23 Encounter for immunization: Secondary | ICD-10-CM | POA: Diagnosis not present

## 2017-06-16 DIAGNOSIS — E7841 Elevated Lipoprotein(a): Secondary | ICD-10-CM

## 2017-06-16 DIAGNOSIS — E785 Hyperlipidemia, unspecified: Secondary | ICD-10-CM

## 2017-06-16 DIAGNOSIS — F17200 Nicotine dependence, unspecified, uncomplicated: Secondary | ICD-10-CM

## 2017-06-16 MED ORDER — TETANUS-DIPHTH-ACELL PERTUSSIS 5-2.5-18.5 LF-MCG/0.5 IM SUSP: 0.5000 mL | Freq: Once | INTRAMUSCULAR | 0 refills | Status: AC

## 2017-06-16 MED ORDER — ALBUTEROL SULFATE HFA 108 (90 BASE) MCG/ACT IN AERS: INHALATION_SPRAY | RESPIRATORY_TRACT | 1 refills | Status: DC

## 2017-06-16 NOTE — Assessment & Plan Note (Addendum)
Symptoms seem controlled without spiriva. Will continue with PRN Albuterol for now. If symptoms worsen consider repeat spirometry since last was done 2015.

## 2017-06-16 NOTE — Patient Instructions (Addendum)
We sent a prescription for the tetanus vaccine to your pharamcy  We will get some labs today  Medication refill sent.

## 2017-06-17 ENCOUNTER — Encounter: Payer: Self-pay | Admitting: Internal Medicine

## 2017-06-17 LAB — CMP14+EGFR
A/G RATIO: 1.9 (ref 1.2–2.2)
ALBUMIN: 4.4 g/dL (ref 3.5–5.5)
ALT: 15 IU/L (ref 0–32)
AST: 16 IU/L (ref 0–40)
Alkaline Phosphatase: 69 IU/L (ref 39–117)
BUN / CREAT RATIO: 16 (ref 9–23)
BUN: 11 mg/dL (ref 6–24)
Bilirubin Total: 0.3 mg/dL (ref 0.0–1.2)
CALCIUM: 9.9 mg/dL (ref 8.7–10.2)
CO2: 24 mmol/L (ref 20–29)
CREATININE: 0.7 mg/dL (ref 0.57–1.00)
Chloride: 101 mmol/L (ref 96–106)
GFR calc Af Amer: 122 mL/min/{1.73_m2} (ref >59)
GFR, EST NON AFRICAN AMERICAN: 106 mL/min/{1.73_m2} (ref >59)
GLOBULIN, TOTAL: 2.3 g/dL (ref 1.5–4.5)
Glucose: 98 mg/dL (ref 65–99)
Potassium: 5.3 mmol/L — ABNORMAL HIGH (ref 3.5–5.2)
SODIUM: 140 mmol/L (ref 134–144)
Total Protein: 6.7 g/dL (ref 6.0–8.5)

## 2017-06-17 LAB — LIPID PANEL
Chol/HDL Ratio: 3.7 ratio (ref 0.0–4.4)
Cholesterol, Total: 188 mg/dL (ref 100–199)
HDL: 51 mg/dL (ref >39)
LDL Calculated: 89 mg/dL (ref 0–99)
Triglycerides: 238 mg/dL — ABNORMAL HIGH (ref 0–149)
VLDL CHOLESTEROL CAL: 48 mg/dL — AB (ref 5–40)

## 2017-06-17 NOTE — Assessment & Plan Note (Signed)
Lipid panel today. Encouraged compliance with Statin.

## 2017-06-17 NOTE — Assessment & Plan Note (Signed)
Not interested in quitting currently.

## 2017-06-21 ENCOUNTER — Telehealth: Payer: Self-pay | Admitting: Internal Medicine

## 2017-06-21 MED ORDER — NAPROXEN 500 MG PO TBEC: 500.0000 mg | DELAYED_RELEASE_TABLET | Freq: Two times a day (BID) | ORAL | 0 refills | Status: DC

## 2017-06-21 NOTE — Telephone Encounter (Signed)
Called patient to discuss lab results.  She is taking Lipitor regularly now.

## 2017-08-16 NOTE — Progress Notes (Addendum)
   [facility] Phone: [phone]   Date of Visit: 08/17/2017   HPI:  Urinary Incontinence:  - reports of urinary incontinence for the past 17 years which seems to worsening slightly  - this is only with coughing, sneezing, or with lifting heavy objects  - no urinary urgency, no urinary frequency or dysuria  - she has tried [employer] exercises at home which have not helped - she had a pap smear done at a recent visit where she had a normal GU exam  - she has had three vaginal deliveries   Anxiety with history of Bipolar DO:  - she has severe anxiety for the past 20 years  - she is nervous to even go to the grocery store. She could not go to her children's school activities.  - she has been hospitalized multiple times for depression, bipolar in the past - reports that she has been on multiple medications in the past for bipolar DO. She did not feel well when taking her medications. She reports that she did not recognize her children when she was on "all of those medications".  - she is interested in establishing care with psychiatry  - she did see a therapist in the past but has not recently. She is interested in seeing [place] [facility] until she establishes care with psychiatry.  - she requests an excuse for jury duty due to her anxiety   ROS: See HPI.  PMFSH:  PMH:  COPD Obesity  Bipolar DO  HLD   PHYSICAL EXAM: BP 130/80   Pulse 84   Temp 98.8 F (37.1 C) (Oral)   Wt 160 lb (72.6 kg)   LMP 07/18/2017   SpO2 98%   BMI 26.63 kg/m  GEN: NAD PSYCH: Mood and affect euthymic, normal rate and volume of speech NEURO: Awake, alert, no focal deficits grossly, normal speech   ASSESSMENT/PLAN: Anxiety with a history of bipolar disorder: Patient reported history of bipolar disorder.  Currently has a lot of anxiety.  Hesitant to start on medication just for anxiety.  History of bipolar disorder.  Additionally patient reported being on multiple medications with various  side effects from medications.  She is willing to see a psychiatrist.  Referral to psychiatry made.  In the meantime she is still interested in behavioral health therapy at our clinic.  Contact information given for this.  Letter to excuse her from jury duty given.  Stress incontinence Symptoms most consistent with stress incontinence.  Urinalysis is unremarkable today.  Patient reports she has been doing [employer] exercises at home without any improvement.  Therefore, will refer to pelvic rehab.  She would also benefit from weight loss.  We did not do a pelvic exam today as this was done at her recent visit  for her physical.  Palma Holter, MD PGY 3 [facility]

## 2017-08-17 ENCOUNTER — Encounter: Payer: Self-pay | Admitting: Internal Medicine

## 2017-08-17 ENCOUNTER — Ambulatory Visit (INDEPENDENT_AMBULATORY_CARE_PROVIDER_SITE_OTHER): Payer: Medicare Other | Admitting: Internal Medicine

## 2017-08-17 ENCOUNTER — Other Ambulatory Visit: Payer: Self-pay

## 2017-08-17 VITALS — BP 130/80 | HR 84 | Temp 98.8°F | Wt 160.0 lb

## 2017-08-17 DIAGNOSIS — F3170 Bipolar disorder, currently in remission, most recent episode unspecified: Secondary | ICD-10-CM | POA: Diagnosis not present

## 2017-08-17 DIAGNOSIS — N393 Stress incontinence (female) (male): Secondary | ICD-10-CM

## 2017-08-17 DIAGNOSIS — F411 Generalized anxiety disorder: Secondary | ICD-10-CM

## 2017-08-17 LAB — POCT URINALYSIS DIP (MANUAL ENTRY)
BILIRUBIN UA: NEGATIVE mg/dL
Bilirubin, UA: NEGATIVE
GLUCOSE UA: NEGATIVE mg/dL
Leukocytes, UA: NEGATIVE
Nitrite, UA: NEGATIVE
PROTEIN UA: NEGATIVE mg/dL
RBC UA: NEGATIVE
SPEC GRAV UA: 1.025 (ref 1.010–1.025)
UROBILINOGEN UA: 0.2 U/dL
pH, UA: 6.5 (ref 5.0–8.0)

## 2017-08-17 NOTE — Patient Instructions (Signed)
I made a referral to the psychiatrist. Please give behavioral health a call until you are set up with psychiatry  I made a referral to pelvic rehab for your urinary stress incontinence.

## 2017-08-17 NOTE — Assessment & Plan Note (Signed)
Symptoms most consistent with stress incontinence.  Urinalysis is unremarkable today.  Patient reports she has been doing [place] exercises at home without any improvement.  Therefore, will refer to pelvic rehab.  She would also benefit from weight loss.  We did not do a pelvic exam today as this was done at her recent visit  for her physical.

## 2017-08-23 ENCOUNTER — Ambulatory Visit: Payer: Medicare Other | Attending: Family Medicine | Admitting: Physical Therapy

## 2017-08-23 ENCOUNTER — Other Ambulatory Visit: Payer: Self-pay

## 2017-08-23 DIAGNOSIS — M6281 Muscle weakness (generalized): Secondary | ICD-10-CM | POA: Insufficient documentation

## 2017-08-23 DIAGNOSIS — R293 Abnormal posture: Secondary | ICD-10-CM | POA: Diagnosis not present

## 2017-08-23 NOTE — Therapy (Signed)
[facility] [address] [place], [place], [address] Phone: [phone]   Fax:  [phone]  Physical Therapy Evaluation  Patient Details  Name: Diane Pacheco MRN: [id number] Date of Birth: Jun 11, 1970 Referring Provider: Pearlean Brownie    Encounter Date: 08/23/2017  PT End of Session - 08/23/17 1014    Visit Number  1    Date for PT Re-Evaluation  10/18/17    PT Start Time  1014    PT Stop Time  1047    PT Time Calculation (min)  33 min    Activity Tolerance  Patient tolerated treatment well    Behavior During Therapy  [facility] for tasks assessed/performed       Past Medical History:  Diagnosis Date  . Asthma    exacerbations w/ respiratory infxns. Well controlled w/ abuterol PRN  . Condyloma acuminatum   . COPD (chronic obstructive pulmonary disease) (HCC)   . Major depression    Unable to take antidepressents due to intolerance. Prefers to handle w/o Rx    Past Surgical History:  Procedure Laterality Date  . TUBAL LIGATION      There were no vitals filed for this visit.   Subjective Assessment - 08/23/17 1023    Subjective  Pt states it has been ongoing for 17 years since birth of her last child who was 8lb10oz.  The recent 2.5 years it has gotten worse.    Patient Stated Goals  Not have leakage happen so often    Currently in Pain?  No/denies         [facility] PT Assessment - 08/23/17 0001      Assessment   Medical Diagnosis  N39.3 (ICD-10-CM) - Stress incontinence    Referring Provider  Pearlean Brownie     Onset Date/Surgical Date  -- 17 years, recently worsening    Prior Therapy  No      Precautions   Precautions  None      Restrictions   Weight Bearing Restrictions  No      Balance Screen   Has the patient fallen in the past 6 months  No      Home Environment   Living Environment  Private residence      Prior Function   Level of Independence  Independent    Vocation  On  disability      Cognition   Overall Cognitive Status  Within Functional Limits for tasks assessed      Observation/Other Assessments   Focus on Therapeutic Outcomes (FOTO)   30% limited based clinical assessment      Posture/Postural Control   Posture/Postural Control  Postural limitations    Postural Limitations  Increased thoracic kyphosis;Rounded Shoulders      ROM / Strength   AROM / PROM / Strength  Strength;AROM      AROM   Overall AROM Comments  lumbar extension 50% limited      Strength   Overall Strength Comments  left hip 4/5 - abduction, adduction, extension      Flexibility   Soft Tissue Assessment /Muscle Length  yes    Hamstrings  25% limted bilaterally      Ambulation/Gait   Gait Pattern  Decreased stride length                Objective measurements completed on examination: See above findings.    Pelvic Floor Special Questions - 08/23/17 0001    Prior Pelvic/Prostate Exam  Yes  Are you Pregnant or attempting pregnancy?  No    Prior Pregnancies  Yes    Number of Pregnancies  3    Number of Vaginal Deliveries  3    Any difficulty with labor and deliveries  No    Episiotomy Performed  No    Currently Sexually Active  Yes    Is this Painful  No    Marinoff Scale  no problems    Urinary Leakage  Yes    How often  a bunch of times/day medium amount    Pad use  no just change underwear    Activities that cause leaking  Coughing;Sneezing;Lifting;Bending    Urinary urgency  No    Urinary frequency  every 3 hours    Fecal incontinence  No    Falling out feeling (prolapse)  No    Skin Integrity  Intact    Perineal Body/Introitus   Normal    Prolapse  Anterior Wall    Pelvic Floor Internal Exam  pt informed and consent given to perform internal soft tissue assessment    Exam Type  Vaginal    Palpation  transverse peroneus tight; left obdurator internus less bulky    Strength  good squeeze, good lift, able to hold agaisnt strong resistance     Strength # of reps  3    Strength # of seconds  3    Tone  normal               PT Education - 08/23/17 1414    Education provided  Yes    Education Details  educated on "knack" and contract prior to standing    Person(s) Educated  Patient    Methods  Explanation    Comprehension  Verbalized understanding       PT Short Term Goals - 08/23/17 1710      PT SHORT TERM GOAL #1   Title  ind with initial HEP    Time  4    Period  Weeks    Status  New    Target Date  09/20/17        PT Long Term Goals - 08/23/17 1710      PT LONG TERM GOAL #1   Title  ind with advanced HEP    Time  8    Period  Weeks    Status  New    Target Date  10/18/17      PT LONG TERM GOAL #2   Title  pt will be able to lift 20lb without leakage in order to lift grandchildren    Time  8    Period  Weeks    Status  New    Target Date  10/18/17      PT LONG TERM GOAL #3   Title  pt reports 50% less leakage throughout the day    Time  8    Period  Weeks    Status  New    Target Date  10/18/17             Plan - 08/23/17 1415    Clinical Impression Statement  Pt presents to PT due to long history of SUI that has been worsening recently.  Pt has leakage every time she coughs, sneezes, lifts, and sometimes just bending over.  Pt has weakness of left hip.  She has bilateral tightness of hamstrings.  She demonstrates abdominal weakness and able to more easily lift LE with pressure to  stabilize pelvis.  Pt has pelvic floor contraction of 4/5, but lacks endurance and only able to hold 3 seconds for 3 reps.  Pt has postural and gait abnormalities as mentioned above.  Pt will benefit from skilled PT to address these impairments and return to maximum function.    History and Personal Factors relevant to plan of care:  3 vaginal deliveries    Clinical Presentation  Evolving    Clinical Decision Making  Low    Rehab Potential  Excellent    PT Frequency  2x / week    PT Duration  8 weeks    PT  Treatment/Interventions  ADLs/Self Care Home Management;Biofeedback;Cryotherapy;Electrical Stimulation;Moist Heat;Therapeutic activities;Therapeutic exercise;Neuromuscular re-education;Patient/family education;Manual techniques;Passive range of motion;Dry needling;Taping    PT Next Visit Plan  biofeedback, pelvic floor and core strength, knack, functional movements lifting and squatting    Consulted and Agree with Plan of Care  Patient       Patient will benefit from skilled therapeutic intervention in order to improve the following deficits and impairments:  Postural dysfunction, Decreased strength, Impaired flexibility  Visit Diagnosis: Muscle weakness (generalized) - Plan: PT plan of care cert/re-cert  Abnormal posture - Plan: PT plan of care cert/re-cert     Problem List Patient Active Problem List   Diagnosis Date Noted  . Stress incontinence 08/17/2017  . Hyperlipidemia 05/27/2016  . Bursitis of shoulder 01/07/2015  . Bicipital tendinitis 01/07/2015  . COPD, mild (HCC) 06/25/2013  . Bipolar disorder, unspecified (HCC) 06/19/2013  . Rotator cuff tendonitis 06/19/2013  . Obesity 07/15/2011  . TOBACCO USER 01/25/2009    Vincente Poli, PT 08/23/2017, 5:16 PM  [facility] [address], [address] [place], [place], [address] Phone: [phone]   Fax:  [phone]  Name: Diane Pacheco MRN: [id number] Date of Birth: 02-Dec-1970

## 2017-08-24 ENCOUNTER — Ambulatory Visit: Payer: Medicare Other | Admitting: Physical Therapy

## 2017-08-24 DIAGNOSIS — R293 Abnormal posture: Secondary | ICD-10-CM | POA: Diagnosis not present

## 2017-08-24 DIAGNOSIS — M6281 Muscle weakness (generalized): Secondary | ICD-10-CM

## 2017-08-24 NOTE — Patient Instructions (Signed)
Access Code: [email or web address]  URL: [email or web address]  Date: 08/24/2017  Prepared by: Dorie Rank   Exercises  Supine Straight Leg Raise with Pelvic Floor Contraction - 10 reps - 1 sets - 2x daily - 7x weekly  Quadruped Exhale with Pelvic Floor Contraction and Arm Raise - 10 reps - 1 sets - 3 sec hold - 2x daily - 7x weekly  Seated Pelvic Floor Contraction with Hip Abduction and Resistance Loop - 10 reps - 3 sets - 3 sec hold - 3x daily - 7x weekly

## 2017-08-24 NOTE — Therapy (Signed)
[facility] [address] [place], [place], [address] Phone: [phone]   Fax:  [phone]  Physical Therapy Treatment  Patient Details  Name: Diane Pacheco MRN: [id number] Date of Birth: Nov 11, 1970 Referring Provider: Pearlean Brownie    Encounter Date: 08/24/2017  PT End of Session - 08/24/17 0902    Visit Number  2    Date for PT Re-Evaluation  10/18/17    PT Start Time  0845    PT Stop Time  0931    PT Time Calculation (min)  46 min    Activity Tolerance  Patient tolerated treatment well    Behavior During Therapy  [facility] for tasks assessed/performed       Past Medical History:  Diagnosis Date  . Asthma    exacerbations w/ respiratory infxns. Well controlled w/ abuterol PRN  . Condyloma acuminatum   . COPD (chronic obstructive pulmonary disease) (HCC)   . Major depression    Unable to take antidepressents due to intolerance. Prefers to handle w/o Rx    Past Surgical History:  Procedure Laterality Date  . TUBAL LIGATION      There were no vitals filed for this visit.  Subjective Assessment - 08/23/17 1023    Subjective  Pt states it has been ongoing for 17 years since birth of her last child who was 8lb10oz.  The recent 2.5 years it has gotten worse.    Patient Stated Goals  Not have leakage happen so often    Currently in Pain?  No/denies                       [facility] Adult PT Treatment/Exercise - 08/24/17 0001      Neuro Re-ed    Neuro Re-ed Details   resting tone 3.58mV; quick contract 3 x average 12.98mV, 10 sec hold 16.36mV, 20 sec hold 17.23mV, rest after test 4.67mV      Exercises   Exercises  Lumbar      Lumbar Exercises: Seated   Other Seated Lumbar Exercises  seated PF contract and hold 3 sec with green band around knees - breathing and squeeze on exhale      Lumbar Exercises: Supine   Bent Knee Raise  20 reps;3 seconds with contract relax biofeedback    Bridge  with [employer]  -- with contract relax biofeedback      Lumbar Exercises: Quadruped   Single Arm Raise  Right;Left;10 reps             PT Education - 08/24/17 0930    Education provided  Yes    Education Details   Access Code: [email or web address]     Person(s) Educated  Patient    Methods  Explanation;Demonstration;Handout;Verbal cues;Tactile cues    Comprehension  Verbalized understanding;Returned demonstration       PT Short Term Goals - 08/23/17 1710      PT SHORT TERM GOAL #1   Title  ind with initial HEP    Time  4    Period  Weeks    Status  New    Target Date  09/20/17        PT Long Term Goals - 08/23/17 1710      PT LONG TERM GOAL #1   Title  ind with advanced HEP    Time  8    Period  Weeks    Status  New    Target Date  10/18/17  PT LONG TERM GOAL #2   Title  pt will be able to lift 20lb without leakage in order to lift grandchildren    Time  8    Period  Weeks    Status  New    Target Date  10/18/17      PT LONG TERM GOAL #3   Title  pt reports 50% less leakage throughout the day    Time  8    Period  Weeks    Status  New    Target Date  10/18/17            Plan - 08/24/17 1111    Clinical Impression Statement  Pt did well with biofeedback today.  She was able to contract and hold for 3 seconds in variety of positions. The strength of her contraction decreased in quadruped and sitting and biofeedback was extremely helpful for pt to see that difference.  Pt was able to get 17mV contraction in supine, 15mV in quadruped, but only 12 mV in sitting.  Pt will benefit from skilled PT to continue working on strength and  coordination with breath to have reduced leakage.    PT Treatment/Interventions  ADLs/Self Care Home Management;Biofeedback;Cryotherapy;Electrical Stimulation;Moist Heat;Therapeutic activities;Therapeutic exercise;Neuromuscular re-education;Patient/family education;Manual techniques;Passive range of motion;Dry needling;Taping    PT  Next Visit Plan  biofeedback, pelvic floor and core strength, knack, functional movements lifting and squatting with breathing    Consulted and Agree with Plan of Care  Patient       Patient will benefit from skilled therapeutic intervention in order to improve the following deficits and impairments:  Postural dysfunction, Decreased strength, Impaired flexibility  Visit Diagnosis: No diagnosis found.     Problem List Patient Active Problem List   Diagnosis Date Noted  . Stress incontinence 08/17/2017  . Hyperlipidemia 05/27/2016  . Bursitis of shoulder 01/07/2015  . Bicipital tendinitis 01/07/2015  . COPD, mild (HCC) 06/25/2013  . Bipolar disorder, unspecified (HCC) 06/19/2013  . Rotator cuff tendonitis 06/19/2013  . Obesity 07/15/2011  . TOBACCO USER 01/25/2009    Vincente Poli, PT 08/24/2017, 11:19 AM  [facility] [address] [place], [place], [address] Phone: [phone]   Fax:  [phone]  Name: Diane Pacheco MRN: [id number] Date of Birth: 1971-02-05

## 2017-08-30 ENCOUNTER — Encounter: Payer: Self-pay | Admitting: Physical Therapy

## 2017-08-30 ENCOUNTER — Ambulatory Visit: Payer: Medicare Other | Admitting: Physical Therapy

## 2017-08-30 DIAGNOSIS — M6281 Muscle weakness (generalized): Secondary | ICD-10-CM | POA: Diagnosis not present

## 2017-08-30 DIAGNOSIS — R293 Abnormal posture: Secondary | ICD-10-CM | POA: Diagnosis not present

## 2017-08-30 NOTE — Patient Instructions (Signed)
Access Code: [email or web address]  URL: [email or web address]  Date: 08/30/2017  Prepared by: Dorie Rank   Exercises  Supine Straight Leg Raise with Pelvic Floor Contraction - 10 reps - 1 sets - 2x daily - 7x weekly  Quadruped Exhale with Pelvic Floor Contraction and Arm Raise - 10 reps - 1 sets - 3 sec hold - 2x daily - 7x weekly  Seated Pelvic Floor Contraction with Hip Abduction and Resistance Loop - 10 reps - 3 sets - 3 sec hold - 3x daily - 7x weekly  Sit to Stand with Pelvic Floor Contraction - 10 reps - 1 sets - 3x daily - 7x weekly  Cat-Camel to Child's Pose - 3 reps - 1 sets - 10 sec hold - 1x daily - 7x weekly  Supine Diaphragmatic Breathing with Pelvic Floor Lengthening - 10 reps - 3 sets - 1x daily - 7x weekly   [facility] [address] [place], [place] [address] Phone # [phone] Fax [phone]

## 2017-08-30 NOTE — Therapy (Signed)
[facility] [address], [address] [place], [place], [address] Phone: [phone]   Fax:  [phone]  Physical Therapy Treatment  Patient Details  Name: Diane Pacheco MRN: [id number] Date of Birth: 03-18-1971 Referring Provider: Pearlean Brownie    Encounter Date: 08/30/2017  PT End of Session - 08/30/17 0900    Visit Number  3    Date for PT Re-Evaluation  10/18/17    PT Start Time  0847    PT Stop Time  0925    PT Time Calculation (min)  38 min    Activity Tolerance  Patient tolerated treatment well    Behavior During Therapy  [facility] for tasks assessed/performed       Past Medical History:  Diagnosis Date  . Asthma    exacerbations w/ respiratory infxns. Well controlled w/ abuterol PRN  . Condyloma acuminatum   . COPD (chronic obstructive pulmonary disease) (HCC)   . Major depression    Unable to take antidepressents due to intolerance. Prefers to handle w/o Rx    Past Surgical History:  Procedure Laterality Date  . TUBAL LIGATION      There were no vitals filed for this visit.  Subjective Assessment - 08/30/17 0854    Subjective  It is a little better.  After doing the exercises I felt a little sore and had some muscle cramping    Patient Stated Goals  Not have leakage happen so often    Currently in Pain?  No/denies                       [facility] Adult PT Treatment/Exercise - 08/30/17 0001      Neuro Re-ed    Neuro Re-ed Details   using biofeedback during exercises to get correct muscle activation      Lumbar Exercises: Stretches   Figure 4 Stretch  2 reps;20 seconds      Lumbar Exercises: Seated   Sit to Stand  10 reps pelvic contraction before standing    Other Seated Lumbar Exercises  seated PF contract and hold 3 sec with green band around knees - breathing and squeeze on exhale    Other Seated Lumbar Exercises  breathing and contracting in sitting to prevent breath holding  during exercises; coordination of exhale on exertion      Lumbar Exercises: Supine   Ab Set  10 reps contract and hold 5 sec, 3 sec rest - pelvic floor isolation      Lumbar Exercises: Quadruped   Opposite Arm/Leg Raise  Right arm/Left leg;Left arm/Right leg;5 reps             PT Education - 08/30/17 0932    Education Details   Access Code: [email or web address]     Person(s) Educated  Patient    Methods  Explanation;Demonstration;Handout    Comprehension  Verbalized understanding;Returned demonstration       PT Short Term Goals - 08/30/17 0900      PT SHORT TERM GOAL #1   Title  ind with initial HEP    Time  4    Period  Weeks    Status  Achieved        PT Long Term Goals - 08/23/17 1710      PT LONG TERM GOAL #1   Title  ind with advanced HEP    Time  8    Period  Weeks    Status  New    Target  Date  10/18/17      PT LONG TERM GOAL #2   Title  pt will be able to lift 20lb without leakage in order to lift grandchildren    Time  8    Period  Weeks    Status  New    Target Date  10/18/17      PT LONG TERM GOAL #3   Title  pt reports 50% less leakage throughout the day    Time  8    Period  Weeks    Status  New    Target Date  10/18/17            Plan - 08/30/17 1056    Clinical Impression Statement  Pt is able to contract and hold >23mV thorughout exercises and up to >45mV.  Pt was able to correctly perform stretches for reduced tone.  Pt was having muscle soreness and tight pelvic floor palpated when applying surface electrodes today . Stretches were added to HEP . Pt continues to benefit from skilled PT to address muscle impairments and continue to improve functional activities.    PT Treatment/Interventions  ADLs/Self Care Home Management;Biofeedback;Cryotherapy;Electrical Stimulation;Moist Heat;Therapeutic activities;Therapeutic exercise;Neuromuscular re-education;Patient/family education;Manual techniques;Passive range of motion;Dry needling;Taping    PT  Next Visit Plan  biofeedback, pelvic floor and core strength, knack, functional movements lifting and squatting with breathing    Consulted and Agree with Plan of Care  Patient       Patient will benefit from skilled therapeutic intervention in order to improve the following deficits and impairments:  Postural dysfunction, Decreased strength, Impaired flexibility  Visit Diagnosis: Muscle weakness (generalized)  Abnormal posture     Problem List Patient Active Problem List   Diagnosis Date Noted  . Stress incontinence 08/17/2017  . Hyperlipidemia 05/27/2016  . Bursitis of shoulder 01/07/2015  . Bicipital tendinitis 01/07/2015  . COPD, mild (HCC) 06/25/2013  . Bipolar disorder, unspecified (HCC) 06/19/2013  . Rotator cuff tendonitis 06/19/2013  . Obesity 07/15/2011  . TOBACCO USER 01/25/2009    Vincente Poli, PT 08/30/2017, 10:59 AM  [facility] [address] [place], [place], [address] Phone: [phone]   Fax:  [phone]  Name: Diane Pacheco MRN: [id number] Date of Birth: Sep 09, 1970

## 2017-09-01 ENCOUNTER — Ambulatory Visit: Payer: Medicare Other | Admitting: Physical Therapy

## 2017-09-01 DIAGNOSIS — M6281 Muscle weakness (generalized): Secondary | ICD-10-CM | POA: Diagnosis not present

## 2017-09-01 DIAGNOSIS — R293 Abnormal posture: Secondary | ICD-10-CM | POA: Diagnosis not present

## 2017-09-01 NOTE — Therapy (Addendum)
[facility] [address] [place], [place], [address] Phone: [phone]   Fax:  [phone]  Physical Therapy Treatment  Patient Details  Name: Diane Pacheco MRN: [id number] Date of Birth: Oct 13, 1970 Referring Provider: Pearlean Brownie    Encounter Date: 09/01/2017  PT End of Session - 09/01/17 1126    Visit Number  4    Date for PT Re-Evaluation  10/18/17    PT Start Time  1049    PT Stop Time  1129    PT Time Calculation (min)  40 min    Activity Tolerance  Patient tolerated treatment well    Behavior During Therapy  [facility] for tasks assessed/performed       Past Medical History:  Diagnosis Date  . Asthma    exacerbations w/ respiratory infxns. Well controlled w/ abuterol PRN  . Condyloma acuminatum   . COPD (chronic obstructive pulmonary disease) (HCC)   . Major depression    Unable to take antidepressents due to intolerance. Prefers to handle w/o Rx    Past Surgical History:  Procedure Laterality Date  . TUBAL LIGATION      There were no vitals filed for this visit.  Subjective Assessment - 09/01/17 1137    Subjective  I feel 65% less leakage.  I can play pool and not run to the bathroom when I have to go and having no leakage when coughing    Patient Stated Goals  Not have leakage happen so often    Currently in Pain?  No/denies                       [facility] Adult PT Treatment/Exercise - 09/01/17 0001      Neuro Re-ed    Neuro Re-ed Details   TrA and pelvic floor engaged      Lumbar Exercises: Standing   Row  Strengthening;Both;20 reps;Theraband    Theraband Level (Row)  Level 4 (Blue)    Shoulder Extension  Strengthening;Both;20 reps;Theraband    Theraband Level (Shoulder Extension)  Level 4 (Blue)      Lumbar Exercises: Seated   Long Arc Quad on [place]  Strengthening;Both;20 reps TrA and pelvic floor engaged    Sit to Stand  10 reps pelvic contraction before standing;  red band    Other Seated Lumbar Exercises  seated PF contract and hold 3 sec with green band around knees - breathing and squeeze on exhale    Other Seated Lumbar Exercises  sitting on ball bouncing, breathing and contracting in sitting to prevent breath holding during exercises; coordination of exhale on exertion      squats with PF contracted - 15x Step ups on 6" step - 20x each side- PF activation on step up       PT Education - 09/01/17 1127    Education provided  Yes    Education Details   Access Code: [email or web address]     Person(s) Educated  Patient    Methods  Explanation;Demonstration       PT Short Term Goals - 08/30/17 0900      PT SHORT TERM GOAL #1   Title  ind with initial HEP    Time  4    Period  Weeks    Status  Achieved        PT Long Term Goals - 09/01/17 1052      PT LONG TERM GOAL #1   Title  ind with advanced HEP  Time  8    Period  Weeks            Plan - 09/01/17 1126    Clinical Impression Statement  Pt is doing well and reports 65% reduction in leakage when coughing since starting exercises.  Pt is able to progress exercises needed some cues for posture throughout with emphasis on TrA and pelvic floor activation.  Pt will benefit from skilled PT and is reduced to 1x/ week due to ability to perform HEP consistently at home.      PT Treatment/Interventions  ADLs/Self Care Home Management;Biofeedback;Cryotherapy;Electrical Stimulation;Moist Heat;Therapeutic activities;Therapeutic exercise;Neuromuscular re-education;Patient/family education;Manual techniques;Passive range of motion;Dry needling;Taping    PT Next Visit Plan  biofeedback, pelvic floor and core strength, knack, functional movements lifting and squatting with breathing    PT Home Exercise Plan   Access Code: [email or web address] progress as needed    Consulted and Agree with Plan of Care  Patient       Patient will benefit from skilled therapeutic intervention in order to improve the following  deficits and impairments:  Postural dysfunction, Decreased strength, Impaired flexibility  Visit Diagnosis: Muscle weakness (generalized)  Abnormal posture     Problem List Patient Active Problem List   Diagnosis Date Noted  . Stress incontinence 08/17/2017  . Hyperlipidemia 05/27/2016  . Bursitis of shoulder 01/07/2015  . Bicipital tendinitis 01/07/2015  . COPD, mild (HCC) 06/25/2013  . Bipolar disorder, unspecified (HCC) 06/19/2013  . Rotator cuff tendonitis 06/19/2013  . Obesity 07/15/2011  . TOBACCO USER 01/25/2009    Vincente Poli, PT 09/01/2017, 11:38 AM  [facility] [address] [place], [place], [address] Phone: [phone]   Fax:  [phone]  Name: Diane Pacheco MRN: [id number] Date of Birth: 11/08/1970

## 2017-09-01 NOTE — Patient Instructions (Signed)
Access Code: [email or web address]  URL: [email or web address]  Date: 09/01/2017  Prepared by: Dorie Rank   Exercises  Supine Straight Leg Raise with Pelvic Floor Contraction - 10 reps - 1 sets - 2x daily - 7x weekly  Quadruped Exhale with Pelvic Floor Contraction and Arm Raise - 10 reps - 1 sets - 3 sec hold - 2x daily - 7x weekly  Seated Pelvic Floor Contraction with Hip Abduction and Resistance Loop - 10 reps - 3 sets - 3 sec hold - 3x daily - 7x weekly  Sit to Stand with Pelvic Floor Contraction - 10 reps - 1 sets - 3x daily - 7x weekly  Cat-Camel to Child's Pose - 3 reps - 1 sets - 10 sec hold - 1x daily - 7x weekly  Supine Diaphragmatic Breathing with Pelvic Floor Lengthening - 10 reps - 3 sets - 1x daily - 7x weekly  Standing Shoulder Row with Anchored Resistance - 10 reps - 3 sets - 1x daily - 7x weekly  Shoulder Extension with Resistance - 10 reps - 3 sets - 1x daily - 7x weekly

## 2017-09-05 ENCOUNTER — Encounter: Payer: Self-pay | Admitting: Physical Therapy

## 2017-09-05 ENCOUNTER — Ambulatory Visit: Payer: Medicare Other | Admitting: Physical Therapy

## 2017-09-05 DIAGNOSIS — M6281 Muscle weakness (generalized): Secondary | ICD-10-CM

## 2017-09-05 DIAGNOSIS — R293 Abnormal posture: Secondary | ICD-10-CM | POA: Diagnosis not present

## 2017-09-05 NOTE — Therapy (Signed)
[facility] [address], [address] [place], [place], [address] Phone: [phone]   Fax:  [phone]  Physical Therapy Treatment  Patient Details  Name: Diane Pacheco MRN: [id number] Date of Birth: October 03, 1970 Referring Provider: Pearlean Brownie    Encounter Date: 09/05/2017  PT End of Session - 09/05/17 0758    Visit Number  5    Date for PT Re-Evaluation  10/18/17    PT Start Time  0758    PT Stop Time  0839    PT Time Calculation (min)  41 min    Activity Tolerance  Patient tolerated treatment well    Behavior During Therapy  [facility] for tasks assessed/performed       Past Medical History:  Diagnosis Date  . Asthma    exacerbations w/ respiratory infxns. Well controlled w/ abuterol PRN  . Condyloma acuminatum   . COPD (chronic obstructive pulmonary disease) (HCC)   . Major depression    Unable to take antidepressents due to intolerance. Prefers to handle w/o Rx    Past Surgical History:  Procedure Laterality Date  . TUBAL LIGATION      There were no vitals filed for this visit.  Subjective Assessment - 09/05/17 0802    Subjective  I was busy this weekend and didn't have much time to do the exercises.  Still feeling much better.    Patient Stated Goals  Not have leakage happen so often    Currently in Pain?  No/denies                       [facility] Adult PT Treatment/Exercise - 09/05/17 0001      Neuro Re-ed    Neuro Re-ed Details   TrA and pelvic floor engaged; sitting on ball bounding, circles and side to side      Lumbar Exercises: Stretches   Single Knee to Chest Stretch  Right;Left;3 reps;10 seconds    Other Lumbar Stretch Exercise  side lying open book - 5 x 10 sec hold each side       Lumbar Exercises: Standing   Functional Squats  5 seconds;15 reps holding 5 lb dumbells; cues for breathing    Row  Strengthening;Both;20 reps;Theraband    Theraband Level (Row)  Level 4 (Blue)     Shoulder Extension  Strengthening;Both;20 reps;Theraband    Theraband Level (Shoulder Extension)  Level 4 (Blue)    Other Standing Lumbar Exercises  hip 3 ways - red banc - 15x each way      Lumbar Exercises: Seated   Sit to Stand  10 reps 2 sets, pelvic contraction before standing; red band    Other Seated Lumbar Exercises  seated PF contract and hold 3 sec with green band around knees - breathing and squeeze on exhale      Lumbar Exercises: Supine   Clam  20 reps red band    Bridge  20 reps;3 seconds    Basic Lumbar Stabilization  10 reps;3 seconds straight leg raise with pelvic and abdominal contraction               PT Short Term Goals - 08/30/17 0900      PT SHORT TERM GOAL #1   Title  ind with initial HEP    Time  4    Period  Weeks    Status  Achieved        PT Long Term Goals - 09/01/17 1052  PT LONG TERM GOAL #1   Title  ind with advanced HEP    Time  8    Period  Weeks            Plan - 09/05/17 6962    Clinical Impression Statement  Pt continues to demonstrate ability to perform exercises correctly but still needs some cues for breathing and not bulging abdomen.  Pt able to progress standing exercises.  Pt will benefit from skilled PT to continue to progress exercises for improved strength and endurance of pelvic floor to prevent leakage    PT Treatment/Interventions  ADLs/Self Care Home Management;Biofeedback;Cryotherapy;Electrical Stimulation;Moist Heat;Therapeutic activities;Therapeutic exercise;Neuromuscular re-education;Patient/family education;Manual techniques;Passive range of motion;Dry needling;Taping    PT Next Visit Plan  progress strenthening, biofeedback, pelvic floor and core strength, knack, functional movements lifting and squatting with breathing    PT Home Exercise Plan   Access Code: [email or web address] progress as needed    Consulted and Agree with Plan of Care  Patient       Patient will benefit from skilled therapeutic intervention  in order to improve the following deficits and impairments:  Postural dysfunction, Decreased strength, Impaired flexibility  Visit Diagnosis: Muscle weakness (generalized)  Abnormal posture     Problem List Patient Active Problem List   Diagnosis Date Noted  . Stress incontinence 08/17/2017  . Hyperlipidemia 05/27/2016  . Bursitis of shoulder 01/07/2015  . Bicipital tendinitis 01/07/2015  . COPD, mild (HCC) 06/25/2013  . Bipolar disorder, unspecified (HCC) 06/19/2013  . Rotator cuff tendonitis 06/19/2013  . Obesity 07/15/2011  . TOBACCO USER 01/25/2009    Vincente Poli 09/05/2017, 8:38 AM  [facility] [address] [place], [place], [address] Phone: [phone]   Fax:  [phone]  Name: Diane Pacheco MRN: [id number] Date of Birth: 07/13/1970

## 2017-09-13 ENCOUNTER — Encounter: Payer: Self-pay | Admitting: Physical Therapy

## 2017-09-13 ENCOUNTER — Ambulatory Visit: Payer: Medicare Other | Admitting: Physical Therapy

## 2017-09-13 DIAGNOSIS — R293 Abnormal posture: Secondary | ICD-10-CM

## 2017-09-13 DIAGNOSIS — M6281 Muscle weakness (generalized): Secondary | ICD-10-CM | POA: Diagnosis not present

## 2017-09-13 NOTE — Therapy (Signed)
[facility] [address], [place], [place], [address] Phone: [phone]   Fax:  [phone]  Physical Therapy Treatment  Patient Details  Name: LAURENE MELENDREZ MRN: [id number] Date of Birth: 01-Jun-1970 Referring Provider: Talbert Cage    Encounter Date: 09/13/2017  PT End of Session - 09/13/17 0845    Visit Number  6    Date for PT Re-Evaluation  10/18/17    PT Start Time  0845    PT Stop Time  0925    PT Time Calculation (min)  40 min    Activity Tolerance  Patient tolerated treatment well    Behavior During Therapy  [facility] for tasks assessed/performed       Past Medical History:  Diagnosis Date  . Asthma    exacerbations w/ respiratory infxns. Well controlled w/ abuterol PRN  . Condyloma acuminatum   . COPD (chronic obstructive pulmonary disease) ([place])   . Major depression    Unable to take antidepressents due to intolerance. Prefers to handle w/o Rx    Past Surgical History:  Procedure Laterality Date  . TUBAL LIGATION      There were no vitals filed for this visit.  Subjective Assessment - 09/13/17 0952    Subjective  I am doing much better, just leakage a little when coughing at night.    Patient Stated Goals  Not have leakage happen so often    Currently in Pain?  No/denies                       [facility] Adult PT Treatment/Exercise - 09/13/17 0001      Lumbar Exercises: Stretches   Active Hamstring Stretch  Right;Left;2 reps;30 seconds      Lumbar Exercises: Aerobic   UBE (Upper Arm Bike)  sitting on ball and bracing L2 3 x 3 PT present to discuss progress      Lumbar Exercises: Standing   Functional Squats  20 reps 10x each direction with hold red band for oblique activation    Forward Lunge  10 reps BOSU 10 x each side    Other Standing Lumbar Exercises  walking with sports cord 8x 4 way 25# fwd/back; 20# side step    Other Standing Lumbar Exercises  standing both  hands chopping and drawing sword - 15x each side               PT Short Term Goals - 08/30/17 0900      PT SHORT TERM GOAL #1   Title  ind with initial HEP    Time  4    Period  Weeks    Status  Achieved        PT Long Term Goals - 09/13/17 9323      PT LONG TERM GOAL #1   Title  ind with advanced HEP    Time  8    Period  Weeks    Status  Achieved      PT LONG TERM GOAL #2   Title  pt will be able to lift 20lb without leakage in order to lift grandchildren    Baseline  not having leakage when lifting    Time  8    Period  Weeks    Status  On-going      PT LONG TERM GOAL #3   Title  pt reports 50% less leakage throughout the day    Baseline  65-70%  Time  8    Period  Weeks    Status  Achieved            Plan - 09/13/17 0950    Clinical Impression Statement  Pt has made excellent progress and is ind with HEP at this time.  She will benefit from discharging from PT with HEP at this time.    PT Treatment/Interventions  ADLs/Self Care Home Management;Biofeedback;Cryotherapy;Electrical Stimulation;Moist Heat;Therapeutic activities;Therapeutic exercise;Neuromuscular re-education;Patient/family education;Manual techniques;Passive range of motion;Dry needling;Taping    PT Next Visit Plan  discharged today    Consulted and Agree with Plan of Care  Patient       Patient will benefit from skilled therapeutic intervention in order to improve the following deficits and impairments:  Postural dysfunction, Decreased strength, Impaired flexibility  Visit Diagnosis: Muscle weakness (generalized)  Abnormal posture     Problem List Patient Active Problem List   Diagnosis Date Noted  . Stress incontinence 08/17/2017  . Hyperlipidemia 05/27/2016  . Bursitis of shoulder 01/07/2015  . Bicipital tendinitis 01/07/2015  . COPD, mild ([place]) 06/25/2013  . Bipolar disorder, unspecified ([place]) 06/19/2013  . Rotator cuff tendonitis 06/19/2013  . Obesity 07/15/2011   . TOBACCO USER 01/25/2009    Zannie Cove, PT 09/13/2017, 9:53 AM  [facility] [address], [place], [place], [address] Phone: [phone]   Fax:  [phone]  Name: DAVIONNE MASTRANGELO MRN: [id number] Date of Birth: [birth year]  PHYSICAL THERAPY DISCHARGE SUMMARY  Visits from Start of Care: 6  Current functional level related to goals / functional outcomes: See above goals met   Remaining deficits: See above   Education / Equipment: HEP  Plan: Patient agrees to discharge.  Patient goals were met. Patient is being discharged due to meeting the stated rehab goals.  ?????    [employer], PT 09/13/17 10:03 AM

## 2017-09-15 ENCOUNTER — Encounter: Payer: Medicare Other | Admitting: Physical Therapy

## 2017-09-28 IMAGING — MG DIGITAL SCREENING BILATERAL MAMMOGRAM WITH CAD
4 series · 4 of 4 positions shown · non-contrast
Comparison: Previous exam(s).

CLINICAL DATA: Screening.

EXAM:
DIGITAL SCREENING BILATERAL MAMMOGRAM WITH CAD

[R MLO]
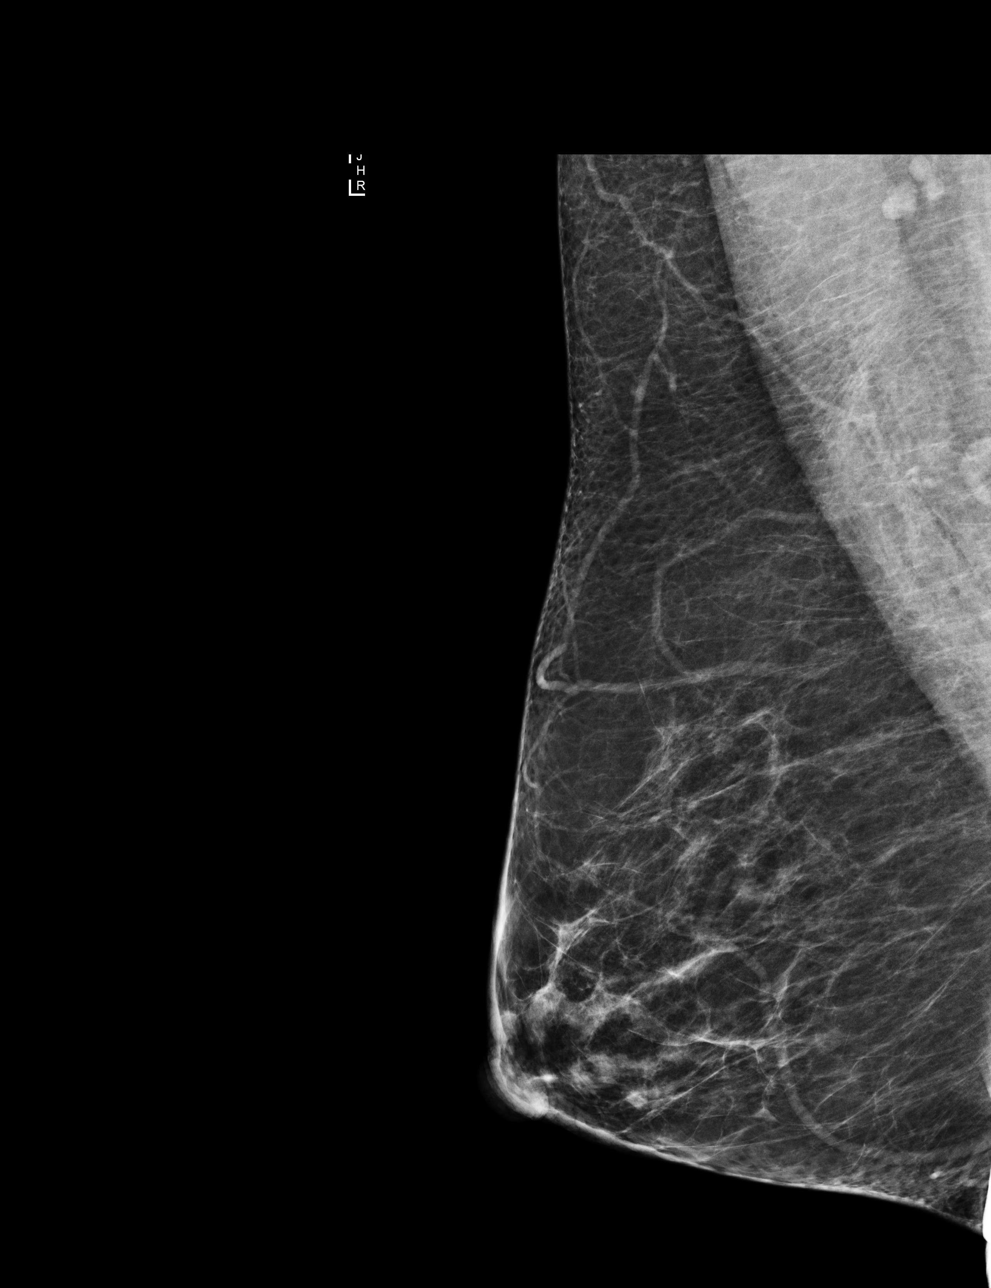

[L MLO]
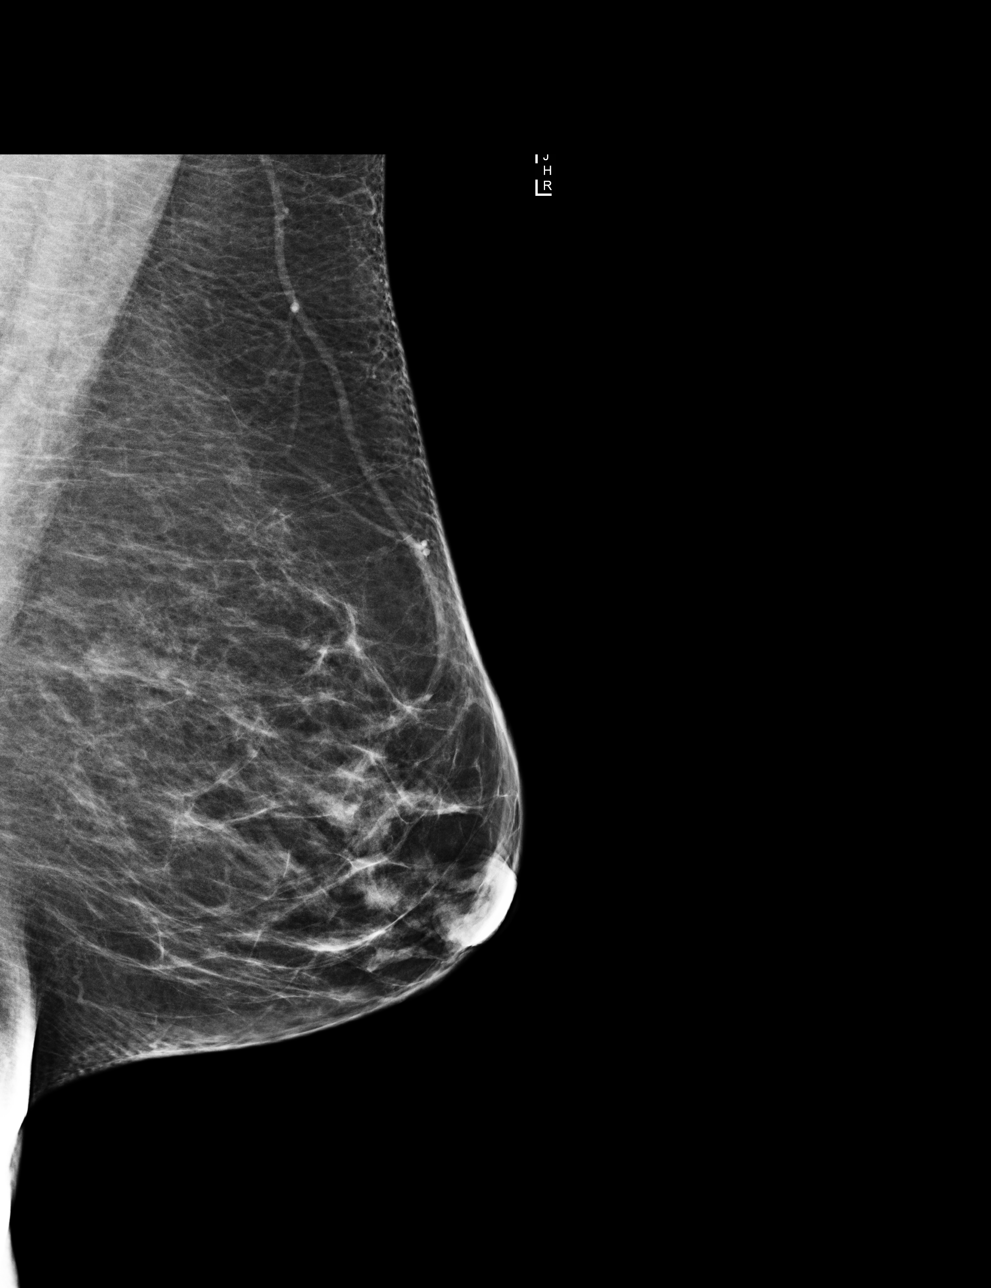

[L CC]
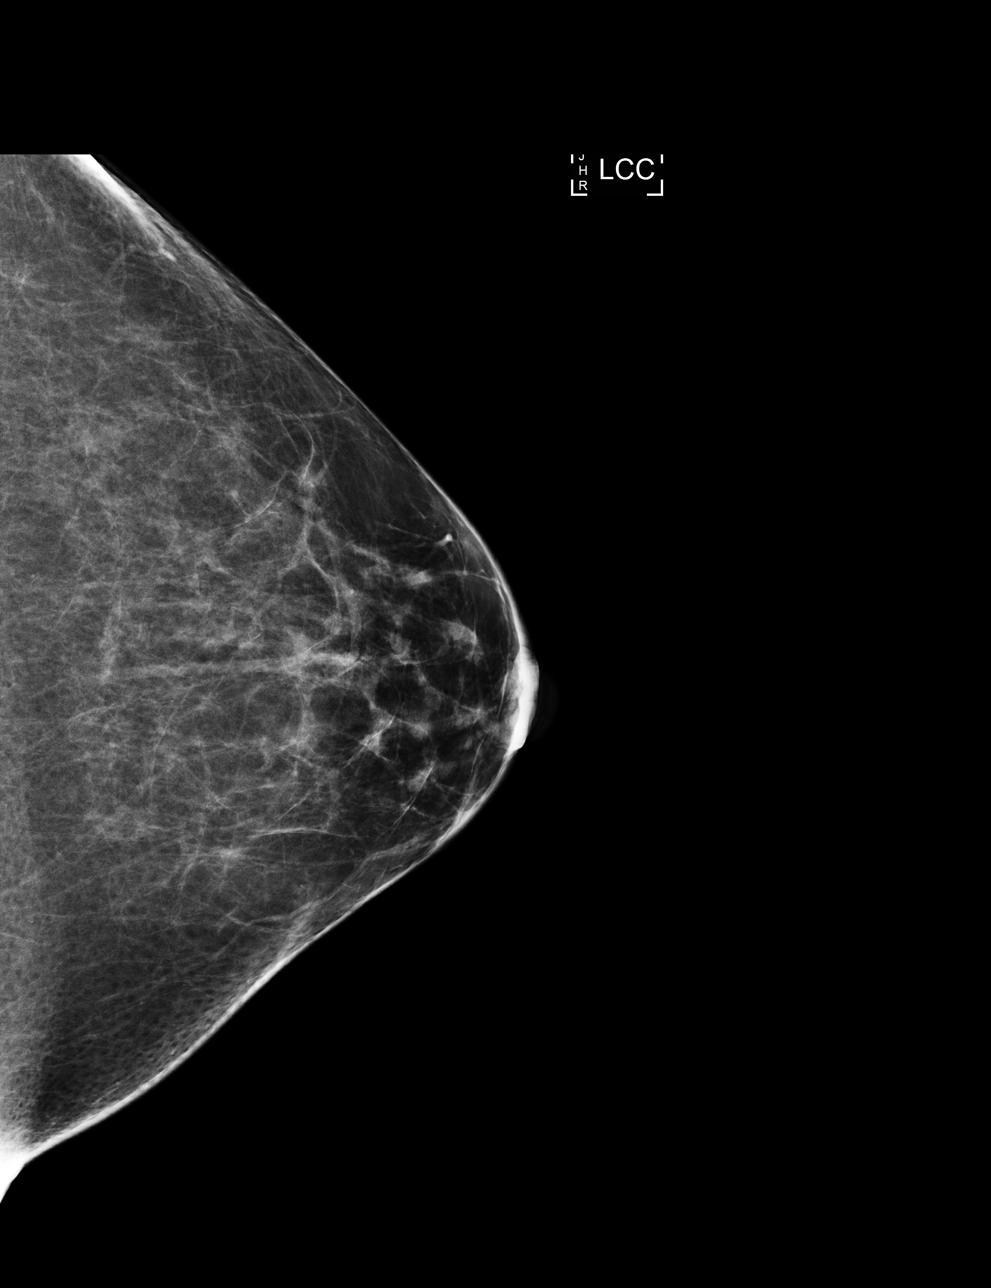

[R CC]
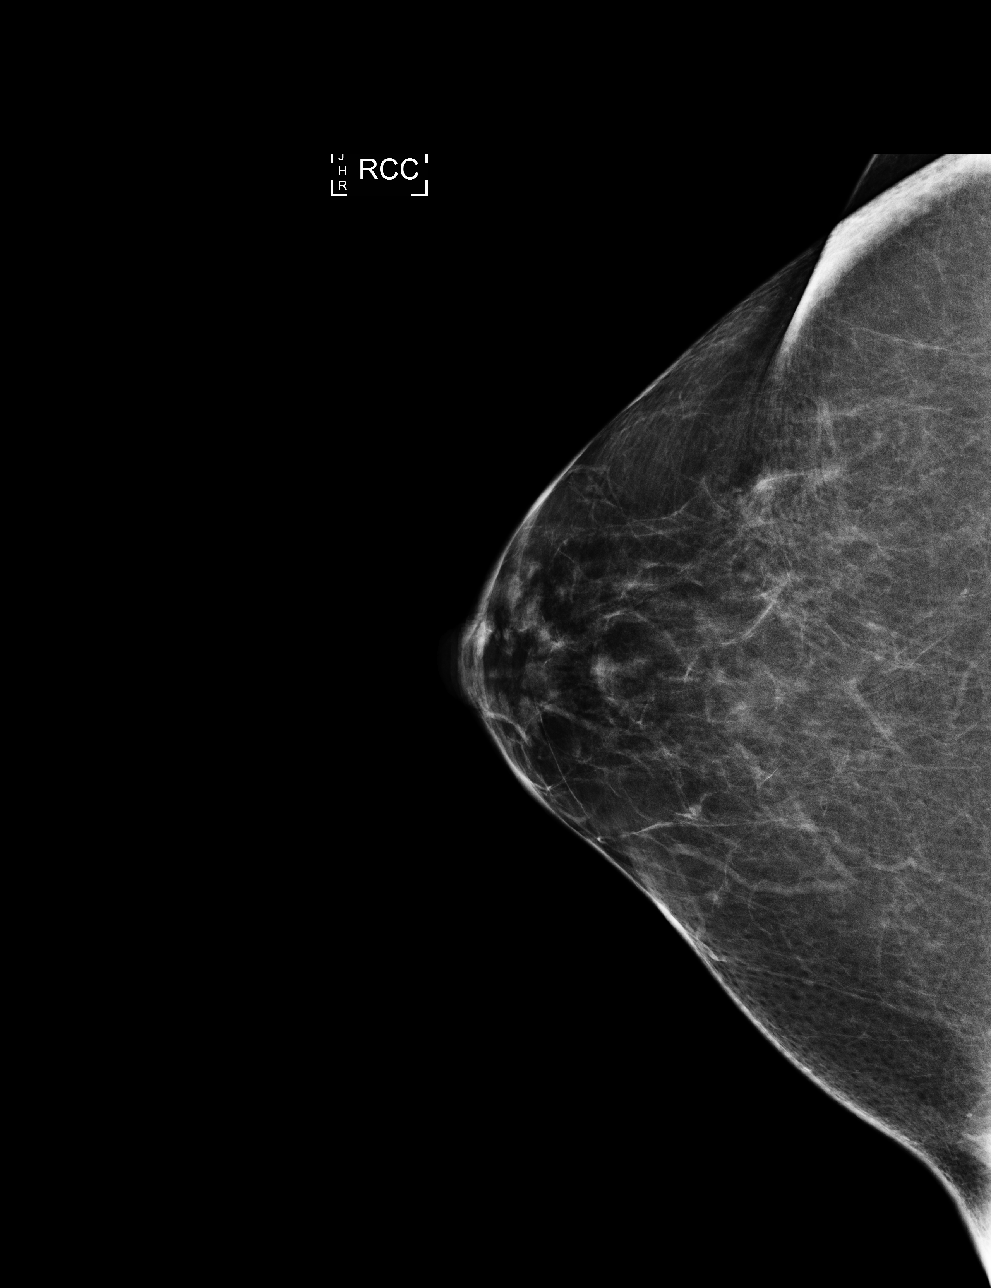

[4 of 4 positions shown; findings below may reference images not displayed]

ACR Breast Density Category b: There are scattered areas of
fibroglandular density.
FINDINGS: There are no findings suspicious for malignancy. Images were
processed with CAD.
IMPRESSION: No mammographic evidence of malignancy. A result letter of this
screening mammogram will be mailed directly to the patient.

RECOMMENDATION:
Screening mammogram in one year. (Code:AS-G-LCT)

BI-RADS CATEGORY  1: Negative.

## 2018-02-06 ENCOUNTER — Other Ambulatory Visit: Payer: Self-pay

## 2018-02-06 ENCOUNTER — Encounter: Payer: Self-pay | Admitting: Family Medicine

## 2018-02-06 ENCOUNTER — Ambulatory Visit (INDEPENDENT_AMBULATORY_CARE_PROVIDER_SITE_OTHER): Payer: Medicare Other | Admitting: Family Medicine

## 2018-02-06 VITALS — BP 125/81 | HR 73 | Temp 98.4°F | Wt 158.0 lb

## 2018-02-06 DIAGNOSIS — M21611 Bunion of right foot: Secondary | ICD-10-CM | POA: Diagnosis not present

## 2018-02-06 DIAGNOSIS — Z23 Encounter for immunization: Secondary | ICD-10-CM

## 2018-02-06 DIAGNOSIS — K649 Unspecified hemorrhoids: Secondary | ICD-10-CM | POA: Diagnosis not present

## 2018-02-06 MED ORDER — BUNION GUARD PADS: 1.0000 | MEDICATED_PAD | 3 refills | Status: DC | PRN

## 2018-02-06 MED ORDER — ALBUTEROL SULFATE HFA 108 (90 BASE) MCG/ACT IN AERS: INHALATION_SPRAY | RESPIRATORY_TRACT | 1 refills | Status: DC

## 2018-02-06 NOTE — Progress Notes (Signed)
    Subjective:  Diane Pacheco is a 47 y.o. female who presents to the [facility] today with a chief complaint of foot pain.   HPI: Right foot pain Patient complains of right-sided foot pain for the past 6 months.  She thinks that she has a bunion.  Pain is on the right lateral distal aspect of her foot.  Next her back toe.  Patient is tried ibuprofen without any improvement.  Patient is tried wide shoes toebox without any improvement.  Patient says she has a family history of bunions and family has had to have bunion surgery.  Patient is interested in seeing podiatry for her foot problem.   Hemorrhoids Patient reports history of hemorrhoids.  Says that she has had some itching and pain around her anus.  She says that she is interested in going to GI to have them treated.  She does not want to try any type of Preparation H or cortisone/steroid treatment at this time.  Declines having a rectal exam today.  Denies any constipation.  Denies any blood in her stool.   ROS: Per HPI  PMH: Smoking history reviewed.    Objective:  Physical Exam: BP 125/81   Pulse 73   Temp 98.4 F (36.9 C) (Oral)   Wt 158 lb (71.7 kg)   LMP 01/25/2018   SpO2 99%   BMI 26.29 kg/m   Gen: NAD, resting comfortably FEET: Right foot has angulated, hallux varus at first MTP joint, mildly tender to palpation.  Not warm.  No results found for this or any previous visit (from the past 72 hour(s)).   Assessment/Plan:  Bunion of great toe of right foot Right foot bunion.  Refer to podiatry for surgical assessment.  Recommend anterior shoe padding and bunion splints for conservative measures between now and then.  May continue ibuprofen as needed for pain.  Hemorrhoids Declines treatment from his office.  Referred to GI for further assessment.   Lab Orders  No laboratory test(s) ordered today    Meds ordered this encounter  Medications  . Foot Care Products (BUNION GUARD) PADS    Sig: 1 each by Does not  apply route as needed.    Dispense:  1 each    Refill:  3  . albuterol (VENTOLIN HFA) 108 (90 Base) MCG/ACT inhaler    Sig: INHALE ONE TO TWO PUFFS BY MOUTH EVERY 6 HOURS AS NEEDED FOR WHEEZING AND FOR SHORTNESS OF BREATH    Dispense:  1 Inhaler    Refill:  1      Thomes Dinning, MD, MS FAMILY MEDICINE RESIDENT - PGY2 02/10/2018 11:13 AM

## 2018-02-06 NOTE — Patient Instructions (Signed)
It was a pleasure to see you today! Thank you for choosing [facility] for your primary care. Diane Pacheco was seen for bunion and hemorrhoids.   For your bunion,  - you can use specialized pads and splints. These can be bought over the counter at your pharmacy or medical supply store.  - I have also referred you to Podiatry.  - Tylenol or Ibuprofen as needed for pain  For your hemorrhoids,  - I have referred you to GI.   Best,  Thomes Dinning, MD, MS FAMILY MEDICINE RESIDENT - PGY2 02/06/2018 3:00 PM  Bunion A bunion is a bump on the base of the big toe that forms when the bones of the big toe joint move out of position. Bunions may be small at first, but they often get larger over time. The can make walking painful. What are the causes? A bunion may be caused by:  Wearing narrow or pointed shoes that force the big toe to press against the other toes.  Abnormal foot development that causes the foot to roll inward (pronate).  Changes in the foot that are caused by certain diseases, such as rheumatoid arthritis and polio.  A foot injury.  What increases the risk? The following factors may make you more likely to develop this condition:  Wearing shoes that squeeze the toes together.  Having certain diseases, such as: ? Rheumatoid arthritis. ? Polio. ? Cerebral palsy.  Having family members who have bunions.  Being born with a foot deformity, such as flat feet or low arches.  Doing activities that put a lot of pressure on the feet, such as ballet dancing.  What are the signs or symptoms? The main symptom of a bunion is a noticeable bump on the big toe. Other symptoms may include:  Pain.  Swelling around the big toe.  Redness and inflammation.  Thick or hardened skin on the big toe or between the toes.  Stiffness or loss of motion in the big toe.  Trouble with walking.  How is this diagnosed? A bunion may be diagnosed based on your symptoms,  medical history, and activities. You may have tests, such as:  X-rays. These allow your health care provider to check the position of the bones in your foot and look for damage to your joint. They also help your health care provider to determine the severity of your bunion and the best way to treat it.  Joint aspiration. In this test, a sample of fluid is removed from the toe joint. This test, which may be done if you are in a lot of pain, helps to rule out diseases that cause painful swelling of the joints, such as arthritis.  How is this treated? There is no cure for a bunion, but treatment can help to prevent a bunion from getting worse. Treatment depends on the severity of your symptoms. Your health care provider may recommend:  Wearing shoes that have a wide toe box.  Using bunion pads to cushion the affected area.  Taping your toes together to keep them in a normal position.  Placing a device inside your shoe (orthotics) to help reduce pressure on your toe joint.  Taking medicine to ease pain, inflammation, and swelling.  Applying heat or ice to the affected area.  Doing stretching exercises.  Surgery to remove scar tissue and move the toes back into their normal position. This treatment is rare.  Follow these instructions at home:  Support your toe joint with  proper footwear, shoe padding, or taping as told by your health care provider.  Take over-the-counter and prescription medicines only as told by your health care provider.  If directed, apply ice to the injured area: ? Put ice in a plastic bag. ? Place a towel between your skin and the bag. ? Leave the ice on for 20 minutes, 2-3 times per day.  If directed, apply heat to the affected area before you exercise. Use the heat source that your health care provider recommends, such as a moist heat pack or a heating pad. ? Place a towel between your skin and the heat source. ? Leave the heat on for 20-30 minutes. ? Remove  the heat if your skin turns bright red. This is especially important if you are unable to feel pain, heat, or cold. You may have a greater risk of getting burned.  Do exercises as told by your health care provider.  Keep all follow-up visits as told by your health care provider. Contact a health care provider if:  Your symptoms get worse.  Your symptoms do not improve in 2 weeks. Get help right away if:  You have severe pain and trouble with walking. This information is not intended to replace advice given to you by your health care provider. Make sure you discuss any questions you have with your health care provider. Document Released: 04/05/2005 Document Revised: 09/11/2015 Document Reviewed: 11/03/2014 Elsevier Interactive Patient Education  [employer].

## 2018-02-10 DIAGNOSIS — M21611 Bunion of right foot: Secondary | ICD-10-CM | POA: Insufficient documentation

## 2018-02-10 DIAGNOSIS — K649 Unspecified hemorrhoids: Secondary | ICD-10-CM | POA: Insufficient documentation

## 2018-02-10 NOTE — Assessment & Plan Note (Signed)
Right foot bunion.  Refer to podiatry for surgical assessment.  Recommend anterior shoe padding and bunion splints for conservative measures between now and then.  May continue ibuprofen as needed for pain.

## 2018-02-10 NOTE — Assessment & Plan Note (Signed)
Declines treatment from his office.  Referred to GI for further assessment.

## 2018-02-21 ENCOUNTER — Ambulatory Visit (INDEPENDENT_AMBULATORY_CARE_PROVIDER_SITE_OTHER): Payer: Medicare Other

## 2018-02-21 ENCOUNTER — Ambulatory Visit (INDEPENDENT_AMBULATORY_CARE_PROVIDER_SITE_OTHER): Payer: Medicare Other | Admitting: Podiatry

## 2018-02-21 ENCOUNTER — Encounter: Payer: Self-pay | Admitting: Podiatry

## 2018-02-21 VITALS — BP 132/86 | HR 95

## 2018-02-21 DIAGNOSIS — M21611 Bunion of right foot: Secondary | ICD-10-CM

## 2018-02-21 DIAGNOSIS — M7751 Other enthesopathy of right foot: Secondary | ICD-10-CM

## 2018-02-21 DIAGNOSIS — M722 Plantar fascial fibromatosis: Secondary | ICD-10-CM | POA: Diagnosis not present

## 2018-02-21 MED ORDER — TRIAMCINOLONE ACETONIDE 10 MG/ML IJ SUSP
10.0000 mg | Freq: Once | INTRAMUSCULAR | Status: AC
  Administered 2018-02-21: 10 mg

## 2018-02-21 NOTE — Patient Instructions (Signed)
Plantar Fasciitis (Heel Spur Syndrome) with Rehab The plantar fascia is a fibrous, ligament-like, soft-tissue structure that spans the bottom of the foot. Plantar fasciitis is a condition that causes pain in the foot due to inflammation of the tissue. SYMPTOMS   Pain and tenderness on the underneath side of the foot.  Pain that worsens with standing or walking. CAUSES  Plantar fasciitis is caused by irritation and injury to the plantar fascia on the underneath side of the foot. Common mechanisms of injury include:  Direct trauma to bottom of the foot.  Damage to a small nerve that runs under the foot where the main fascia attaches to the heel bone.  Stress placed on the plantar fascia due to bone spurs. RISK INCREASES WITH:   Activities that place stress on the plantar fascia (running, jumping, pivoting, or cutting).  Poor strength and flexibility.  Improperly fitted shoes.  Tight calf muscles.  Flat feet.  Failure to warm-up properly before activity.  Obesity. PREVENTION  Warm up and stretch properly before activity.  Allow for adequate recovery between workouts.  Maintain physical fitness:  Strength, flexibility, and endurance.  Cardiovascular fitness.  Maintain a health body weight.  Avoid stress on the plantar fascia.  Wear properly fitted shoes, including arch supports for individuals who have flat feet.  PROGNOSIS  If treated properly, then the symptoms of plantar fasciitis usually resolve without surgery. However, occasionally surgery is necessary.  RELATED COMPLICATIONS   Recurrent symptoms that may result in a chronic condition.  Problems of the lower back that are caused by compensating for the injury, such as limping.  Pain or weakness of the foot during push-off following surgery.  Chronic inflammation, scarring, and partial or complete fascia tear, occurring more often from repeated injections.  TREATMENT  Treatment initially involves the  use of ice and medication to help reduce pain and inflammation. The use of strengthening and stretching exercises may help reduce pain with activity, especially stretches of the Achilles tendon. These exercises may be performed at home or with a therapist. Your caregiver may recommend that you use heel cups of arch supports to help reduce stress on the plantar fascia. Occasionally, corticosteroid injections are given to reduce inflammation. If symptoms persist for greater than 6 months despite non-surgical (conservative), then surgery may be recommended.   MEDICATION   If pain medication is necessary, then nonsteroidal anti-inflammatory medications, such as aspirin and ibuprofen, or other minor pain relievers, such as acetaminophen, are often recommended.  Do not take pain medication within 7 days before surgery.  Prescription pain relievers may be given if deemed necessary by your caregiver. Use only as directed and only as much as you need.  Corticosteroid injections may be given by your caregiver. These injections should be reserved for the most serious cases, because they may only be given a certain number of times.  HEAT AND COLD  Cold treatment (icing) relieves pain and reduces inflammation. Cold treatment should be applied for 10 to 15 minutes every 2 to 3 hours for inflammation and pain and immediately after any activity that aggravates your symptoms. Use ice packs or massage the area with a piece of ice (ice massage).  Heat treatment may be used prior to performing the stretching and strengthening activities prescribed by your caregiver, physical therapist, or athletic trainer. Use a heat pack or soak the injury in warm water.  SEEK IMMEDIATE MEDICAL CARE IF:  Treatment seems to offer no benefit, or the condition worsens.  Any medications   produce adverse side effects.  EXERCISES- RANGE OF MOTION (ROM) AND STRETCHING EXERCISES - Plantar Fasciitis (Heel Spur Syndrome) These exercises  may help you when beginning to rehabilitate your injury. Your symptoms may resolve with or without further involvement from your physician, physical therapist or athletic trainer. While completing these exercises, remember:   Restoring tissue flexibility helps normal motion to return to the joints. This allows healthier, less painful movement and activity.  An effective stretch should be held for at least 30 seconds.  A stretch should never be painful. You should only feel a gentle lengthening or release in the stretched tissue.  RANGE OF MOTION - Toe Extension, Flexion  Sit with your right / left leg crossed over your opposite knee.  Grasp your toes and gently pull them back toward the top of your foot. You should feel a stretch on the bottom of your toes and/or foot.  Hold this stretch for 10 seconds.  Now, gently pull your toes toward the bottom of your foot. You should feel a stretch on the top of your toes and or foot.  Hold this stretch for 10 seconds. Repeat  times. Complete this stretch 3 times per day.   RANGE OF MOTION - Ankle Dorsiflexion, Active Assisted  Remove shoes and sit on a chair that is preferably not on a carpeted surface.  Place right / left foot under knee. Extend your opposite leg for support.  Keeping your heel down, slide your right / left foot back toward the chair until you feel a stretch at your ankle or calf. If you do not feel a stretch, slide your bottom forward to the edge of the chair, while still keeping your heel down.  Hold this stretch for 10 seconds. Repeat 3 times. Complete this stretch 2 times per day.   STRETCH  Gastroc, Standing  Place hands on wall.  Extend right / left leg, keeping the front knee somewhat bent.  Slightly point your toes inward on your back foot.  Keeping your right / left heel on the floor and your knee straight, shift your weight toward the wall, not allowing your back to arch.  You should feel a gentle stretch  in the right / left calf. Hold this position for 10 seconds. Repeat 3 times. Complete this stretch 2 times per day.  STRETCH  Soleus, Standing  Place hands on wall.  Extend right / left leg, keeping the other knee somewhat bent.  Slightly point your toes inward on your back foot.  Keep your right / left heel on the floor, bend your back knee, and slightly shift your weight over the back leg so that you feel a gentle stretch deep in your back calf.  Hold this position for 10 seconds. Repeat 3 times. Complete this stretch 2 times per day.  STRETCH  Gastrocsoleus, Standing  Note: This exercise can place a lot of stress on your foot and ankle. Please complete this exercise only if specifically instructed by your caregiver.   Place the ball of your right / left foot on a step, keeping your other foot firmly on the same step.  Hold on to the wall or a rail for balance.  Slowly lift your other foot, allowing your body weight to press your heel down over the edge of the step.  You should feel a stretch in your right / left calf.  Hold this position for 10 seconds.  Repeat this exercise with a slight bend in your right /   left knee. Repeat 3 times. Complete this stretch 2 times per day.   STRENGTHENING EXERCISES - Plantar Fasciitis (Heel Spur Syndrome)  These exercises may help you when beginning to rehabilitate your injury. They may resolve your symptoms with or without further involvement from your physician, physical therapist or athletic trainer. While completing these exercises, remember:   Muscles can gain both the endurance and the strength needed for everyday activities through controlled exercises.  Complete these exercises as instructed by your physician, physical therapist or athletic trainer. Progress the resistance and repetitions only as guided.  STRENGTH - Towel Curls  Sit in a chair positioned on a non-carpeted surface.  Place your foot on a towel, keeping your heel  on the floor.  Pull the towel toward your heel by only curling your toes. Keep your heel on the floor. Repeat 3 times. Complete this exercise 2 times per day.  STRENGTH - Ankle Inversion  Secure one end of a rubber exercise band/tubing to a fixed object (table, pole). Loop the other end around your foot just before your toes.  Place your fists between your knees. This will focus your strengthening at your ankle.  Slowly, pull your big toe up and in, making sure the band/tubing is positioned to resist the entire motion.  Hold this position for 10 seconds.  Have your muscles resist the band/tubing as it slowly pulls your foot back to the starting position. Repeat 3 times. Complete this exercises 2 times per day.  Document Released: 04/05/2005 Document Revised: 06/28/2011 Document Reviewed: 07/18/2008 [employer] Patient Information 2014 [employer] Bunion A bunion is a bump on the base of the big toe that forms when the bones of the big toe joint move out of position. Bunions may be small at first, but they often get larger over time. The can make walking painful. What are the causes? A bunion may be caused by:  Wearing narrow or pointed shoes that force the big toe to press against the other toes.  Abnormal foot development that causes the foot to roll inward (pronate).  Changes in the foot that are caused by certain diseases, such as rheumatoid arthritis and polio.  A foot injury.  What increases the risk? The following factors may make you more likely to develop this condition:  Wearing shoes that squeeze the toes together.  Having certain diseases, such as: ? Rheumatoid arthritis. ? Polio. ? Cerebral palsy.  Having family members who have bunions.  Being born with a foot deformity, such as flat feet or low arches.  Doing activities that put a lot of pressure on the feet, such as ballet dancing.  What are the signs or symptoms? The main symptom of a bunion is a  noticeable bump on the big toe. Other symptoms may include:  Pain.  Swelling around the big toe.  Redness and inflammation.  Thick or hardened skin on the big toe or between the toes.  Stiffness or loss of motion in the big toe.  Trouble with walking.  How is this diagnosed? A bunion may be diagnosed based on your symptoms, medical history, and activities. You may have tests, such as:  X-rays. These allow your health care provider to check the position of the bones in your foot and look for damage to your joint. They also help your health care provider to determine the severity of your bunion and the best way to treat it.  Joint aspiration. In this test, a sample of fluid is removed from   the toe joint. This test, which may be done if you are in a lot of pain, helps to rule out diseases that cause painful swelling of the joints, such as arthritis.  How is this treated? There is no cure for a bunion, but treatment can help to prevent a bunion from getting worse. Treatment depends on the severity of your symptoms. Your health care provider may recommend:  Wearing shoes that have a wide toe box.  Using bunion pads to cushion the affected area.  Taping your toes together to keep them in a normal position.  Placing a device inside your shoe (orthotics) to help reduce pressure on your toe joint.  Taking medicine to ease pain, inflammation, and swelling.  Applying heat or ice to the affected area.  Doing stretching exercises.  Surgery to remove scar tissue and move the toes back into their normal position. This treatment is rare.  Follow these instructions at home:  Support your toe joint with proper footwear, shoe padding, or taping as told by your health care provider.  Take over-the-counter and prescription medicines only as told by your health care provider.  If directed, apply ice to the injured area: ? Put ice in a plastic bag. ? Place a towel between your skin and the  bag. ? Leave the ice on for 20 minutes, 2-3 times per day.  If directed, apply heat to the affected area before you exercise. Use the heat source that your health care provider recommends, such as a moist heat pack or a heating pad. ? Place a towel between your skin and the heat source. ? Leave the heat on for 20-30 minutes. ? Remove the heat if your skin turns bright red. This is especially important if you are unable to feel pain, heat, or cold. You may have a greater risk of getting burned.  Do exercises as told by your health care provider.  Keep all follow-up visits as told by your health care provider. Contact a health care provider if:  Your symptoms get worse.  Your symptoms do not improve in 2 weeks. Get help right away if:  You have severe pain and trouble with walking. This information is not intended to replace advice given to you by your health care provider. Make sure you discuss any questions you have with your health care provider. Document Released: 04/05/2005 Document Revised: 09/11/2015 Document Reviewed: 11/03/2014 [employer] Interactive Patient Education  2018 [employer]  

## 2018-02-22 NOTE — Progress Notes (Signed)
Subjective:   Patient ID: Diane Pacheco, female   DOB: 47 y.o.   MRN: [id number]   HPI 47 year old female presents the office today for primary concerns of pain on the bunion site on the right side.  This is been ongoing about 6 months but any significant improvement.  She also states that she has had some pain to the heel which is been a chronic issue for several years.  Her main concern today is a bunion.  Denies any recent injury or trauma or any treatment.  There is painful shoes and pressure.  She has some minimal swelling to the bunion site.  No redness or warmth.  No other concerns.   Review of Systems  All other systems reviewed and are negative.  Past Medical History:  Diagnosis Date  . Asthma    exacerbations w/ respiratory infxns. Well controlled w/ abuterol PRN  . Condyloma acuminatum   . COPD (chronic obstructive pulmonary disease) (HCC)   . Major depression    Unable to take antidepressents due to intolerance. Prefers to handle w/o Rx    Past Surgical History:  Procedure Laterality Date  . TUBAL LIGATION       Current Outpatient Medications:  .  albuterol (VENTOLIN HFA) 108 (90 Base) MCG/ACT inhaler, INHALE ONE TO TWO PUFFS BY MOUTH EVERY 6 HOURS AS NEEDED FOR WHEEZING AND FOR SHORTNESS OF BREATH, Disp: 1 Inhaler, Rfl: 1 .  Foot Care Products (BUNION GUARD) PADS, 1 each by Does not apply route as needed., Disp: 1 each, Rfl: 3 .  naproxen (EC NAPROSYN) 500 MG EC tablet, Take 1 tablet (500 mg total) by mouth 2 (two) times daily with a meal. For 5 days then as needed, Disp: 14 tablet, Rfl: 0 .  Spacer/Aero-Holding Chambers DEVI, Use with inhaler as instructed, Disp: 2 each, Rfl: 1 .  atorvastatin (LIPITOR) 20 MG tablet, TAKE ONE TABLET BY MOUTH ONCE DAILY (Patient not taking: Reported on 02/21/2018), Disp: 90 tablet, Rfl: 0  No Known Allergies      Objective:  Physical Exam  General: AAO x3, NAD  Dermatological: Skin is warm, dry and supple bilateral. Nails x  10 are well manicured; remaining integument appears unremarkable at this time. There are no open sores, no preulcerative lesions, no rash or signs of infection present.  Vascular: Dorsalis Pedis artery and Posterior Tibial artery pedal pulses are 2/4 bilateral with immedate capillary fill time. Pedal hair growth present. No varicosities and no lower extremity edema present bilateral. There is no pain with calf compression, swelling, warmth, erythema.   Neruologic: Grossly intact via light touch bilateral.. Protective threshold with Semmes Wienstein monofilament intact to all pedal sites bilateral.   Musculoskeletal: Moderate bunion deformities present there is mild edema to the medial aspect the first metatarsal head on the bunion site but there is no erythema increased warmth.  There is no pain or crepitation with MPJ range of motion.  No first ray hypermobility is present.  Mild tenderness palpation on the plantar medial tubercle of the calcaneus at the insertion of the plantar fascia.  Plantar fascia appears to be intact.  Achilles tendon intact.  No area pinpoint tenderness otherwise.  Muscular strength 5/5 in all groups tested bilateral.  Gait: Unassisted, Nonantalgic.       Assessment:   Right foot first MPJ capsulitis, bunion deformity; chronic heel pain, plantar fasciitis    Plan:  -Treatment options discussed including all alternatives, risks, and complications -Etiology of symptoms were discussed -X-rays were  obtained and reviewed with the patient.  Bunion deformities present.  No evidence of acute fracture or stress fracture identified at this time. -Steroid injections performed on the first metatarsal phalangeal joint of the right foot.  See procedure note below. -Dispensed offloading pads for the bunion.  In general we discussed wearing supportive shoes and orthotics to help with both issues.  In regard to the heel we discussed stretching, icing daily.  We also consider steroid  injection of the heel if needed next appointment.  Procedure: Injection Small Joint  Discussed alternatives, risks, complications and verbal consent was obtained.  Location: Right 1st MTPJ Skin Prep: Betadine. Injectate: 0.5cc 0.5% marcaine plain, 0.5 cc kenalog 10. Disposition: Patient tolerated procedure well. Injection site dressed with a band-aid.  Post-injection care was discussed and return precautions discussed.    Vivi Barrack DPM

## 2018-03-14 ENCOUNTER — Ambulatory Visit: Payer: Medicare Other | Admitting: Podiatry

## 2018-03-20 ENCOUNTER — Ambulatory Visit (INDEPENDENT_AMBULATORY_CARE_PROVIDER_SITE_OTHER): Payer: Medicare Other | Admitting: Podiatry

## 2018-03-20 ENCOUNTER — Encounter: Payer: Self-pay | Admitting: Podiatry

## 2018-03-20 DIAGNOSIS — M7751 Other enthesopathy of right foot: Secondary | ICD-10-CM | POA: Diagnosis not present

## 2018-03-20 DIAGNOSIS — M722 Plantar fascial fibromatosis: Secondary | ICD-10-CM | POA: Diagnosis not present

## 2018-03-20 DIAGNOSIS — M21611 Bunion of right foot: Secondary | ICD-10-CM

## 2018-03-20 NOTE — Progress Notes (Signed)
Subjective: 47 year old female presents the office today for follow-up evaluation of a painful bunion on her right foot.  She states that she had some discomfort the first 2 days of the injection but since then she has had no pain and she states "you fixed my foot".  She states that her heels do not bother her as long she wears shoes and socks.  She is having the pain today she has no other concerns. Denies any systemic complaints such as fevers, chills, nausea, vomiting. No acute changes since last appointment, and no other complaints at this time.   Objective: AAO x3, NAD DP/PT pulses palpable bilaterally, CRT less than 3 seconds Moderate bunion deformity present on the right foot.  There is no pain with first MPJ range of motion is no crepitation there is no tenderness upon palpation.  No areas of tenderness identified otherwise bilaterally there is no pain on the course or insertion of the plantar fascia. No open lesions or pre-ulcerative lesions.  No pain with calf compression, swelling, warmth, erythema  Assessment: Resolved right foot pain  Plan: -All treatment options discussed with the patient including all alternatives, risks, complications.  -She is doing very flexible shoes without inserts.  We discussed wearing a more supportive shoe to help with both issues.  Otherwise she is doing well.  I will see her back as needed.  Call any questions or concerns. -Patient encouraged to call the office with any questions, concerns, change in symptoms.   Vivi BarrackMatthew R Aleya Durnell DPM

## 2018-03-27 ENCOUNTER — Telehealth: Payer: Self-pay | Admitting: Family Medicine

## 2018-03-27 NOTE — Telephone Encounter (Signed)
Pt is checking on a referral for a Gastrologist concerning her hemorid. Pt said she was told this would be placed at her last appointment and she has not heard anything. She would like to know if this referral could be replaced.

## 2018-03-28 NOTE — Telephone Encounter (Signed)
Will forward to referral coordinator as this was placed in the wrong workqueue.  Driana Dazey,CMA

## 2018-03-29 ENCOUNTER — Ambulatory Visit: Payer: Medicare Other

## 2018-04-03 ENCOUNTER — Other Ambulatory Visit: Payer: Self-pay

## 2018-04-03 ENCOUNTER — Ambulatory Visit (INDEPENDENT_AMBULATORY_CARE_PROVIDER_SITE_OTHER): Payer: Medicare Other

## 2018-04-03 ENCOUNTER — Telehealth: Payer: Self-pay

## 2018-04-03 VITALS — BP 122/82 | HR 70 | Temp 98.6°F | Ht 65.0 in | Wt 158.4 lb

## 2018-04-03 DIAGNOSIS — Z0000 Encounter for general adult medical examination without abnormal findings: Secondary | ICD-10-CM

## 2018-04-03 DIAGNOSIS — Z135 Encounter for screening for eye and ear disorders: Secondary | ICD-10-CM | POA: Diagnosis not present

## 2018-04-03 DIAGNOSIS — F319 Bipolar disorder, unspecified: Secondary | ICD-10-CM

## 2018-04-03 MED ORDER — NAPROXEN 500 MG PO TBEC: 500.0000 mg | DELAYED_RELEASE_TABLET | Freq: Two times a day (BID) | ORAL | 0 refills | Status: DC

## 2018-04-03 NOTE — Telephone Encounter (Signed)
Patient informed.  Diane Pacheco,CMA  

## 2018-04-03 NOTE — Progress Notes (Signed)
Subjective:   Diane Pacheco is a 47 y.o. female who presents for an Initial Medicare Annual Wellness Visit.  Review of Systems    Physical assessment deferred to PCP.  Cardiac Risk Factors include: dyslipidemia;sedentary lifestyle;smoking/ tobacco exposure     Objective:    Today's Vitals   04/03/18 0900  BP: 122/82  Pulse: 70  Temp: 98.6 F (37 C)  TempSrc: Oral  SpO2: 98%  Weight: 158 lb 6.4 oz (71.8 kg)  Height: 5\' 5"  (1.651 m)  PainSc: 0-No pain   Body mass index is 26.36 kg/m.  Advanced Directives 04/03/2018 02/06/2018 08/23/2017 08/17/2017 06/16/2017 05/27/2016 04/27/2016  Does Patient Have a Medical Advance Directive? No No No No No No No  Would patient like information on creating a medical advance directive? No - Patient declined No - Patient declined No - Patient declined No - Patient declined No - Patient declined No - Patient declined No - Patient declined    Current Medications (verified) Outpatient Encounter Medications as of 04/03/2018  Medication Sig  . albuterol (VENTOLIN HFA) 108 (90 Base) MCG/ACT inhaler INHALE ONE TO TWO PUFFS BY MOUTH EVERY 6 HOURS AS NEEDED FOR WHEEZING AND FOR SHORTNESS OF BREATH  . naproxen (EC NAPROSYN) 500 MG EC tablet Take 1 tablet (500 mg total) by mouth 2 (two) times daily with a meal. For 5 days then as needed  . Spacer/Aero-Holding Rudean Curt Use with inhaler as instructed  . atorvastatin (LIPITOR) 20 MG tablet TAKE ONE TABLET BY MOUTH ONCE DAILY (Patient not taking: Reported on 04/03/2018)  . Foot Care Products (BUNION GUARD) PADS 1 each by Does not apply route as needed. (Patient not taking: Reported on 04/03/2018)   No facility-administered encounter medications on file as of 04/03/2018.     Allergies (verified) Patient has no known allergies.   History: Past Medical History:  Diagnosis Date  . Asthma    exacerbations w/ respiratory infxns. Well controlled w/ abuterol PRN  . Condyloma acuminatum   . COPD  (chronic obstructive pulmonary disease) (HCC)   . Major depression    Unable to take antidepressents due to intolerance. Prefers to handle w/o Rx   Past Surgical History:  Procedure Laterality Date  . TUBAL LIGATION     Family History  Problem Relation Age of Onset  . Cancer Mother        breast cancer at 26yo  . Heart attack Mother        late 65s   . COPD Mother   . Healthy Father   . Breast cancer Maternal Grandmother        around 60yo  . Breast cancer Paternal Grandmother        around 10yo   Social History   Socioeconomic History  . Marital status: Significant Other    Spouse name: Not on file  . Number of children: 3  . Years of education: 28  . Highest education level: 11th grade  Occupational History  . Occupation: disabled  Social Needs  . Financial resource strain: Not hard at all  . Food insecurity:    Worry: Never true    Inability: Never true  . Transportation needs:    Medical: No    Non-medical: No  Tobacco Use  . Smoking status: Current Every Day Smoker    Packs/day: 1.00  . Smokeless tobacco: Never Used  Substance and Sexual Activity  . Alcohol use: Yes    Comment: weekly weekends per pt  . Drug  use: Yes    Types: Marijuana    Comment: pt states daily use  . Sexual activity: Not Currently    Birth control/protection: Surgical  Lifestyle  . Physical activity:    Days per week: 0 days    Minutes per session: 0 min  . Stress: Not at all  Relationships  . Social connections:    Talks on phone: More than three times a week    Gets together: Once a week    Attends religious service: Never    Active member of club or organization: No    Attends meetings of clubs or organizations: Never    Relationship status: Living with partner  Other Topics Concern  . Not on file  Social History Narrative   Lives in house with significant other. One level house, no trouble with stairs, has smoke alarms. No throw rugs on floor.   Has a cat, dog, bird,  and fish.      Eat meat, vegetables, does not eat fruit. Likes to drink milk.   No real hobbies but likes to play pool.      Always wears seat belt.       Tobacco Counseling Ready to quit: No Counseling given: No   Clinical Intake:  Pre-visit preparation completed: Yes  Pain : No/denies pain Pain Score: 0-No pain    Nutritional Status: BMI 25 -29 Overweight Nutritional Risks: None Diabetes: No  How often do you need to have someone help you when you read instructions, pamphlets, or other written materials from your doctor or pharmacy?: 1 - Never What is the last grade level you completed in school?: 11th grade  Interpreter Needed?: No    Activities of Daily Living In your present state of health, do you have any difficulty performing the following activities: 04/03/2018  Hearing? N  Vision? N  Difficulty concentrating or making decisions? Y  Comment dad helps with decisions  Walking or climbing stairs? N  Dressing or bathing? N  Doing errands, shopping? N  Preparing Food and eating ? N  Using the Toilet? N  In the past six months, have you accidently leaked urine? N  Do you have problems with loss of bowel control? N  Managing your Medications? N  Managing your Finances? Y  Housekeeping or managing your Housekeeping? Y  Some recent data might be hidden    Immunizations and Health Maintenance Immunization History  Administered Date(s) Administered  . Influenza Whole 01/11/2008  . Influenza,inj,Quad PF,6+ Mos 06/19/2013, 01/07/2015, 04/14/2016, 02/06/2018  . Td 05/21/1999  . Tdap 06/16/2017   Health Maintenance Due  Topic Date Due  . HIV Screening  03/21/1988    Patient Care Team: Garnette Gunner, MD as PCP - General (Family Medicine) Mateo Flow, MD as Consulting Physician (Ophthalmology)  Indicate any recent Medical Services you may have received from other than [facility] providers in the past year (date may be approximate).     Assessment:    This is a routine wellness examination for [name].  Hearing/Vision screen  Hearing Screening   Method: Audiometry   125Hz  250Hz  500Hz  1000Hz  2000Hz  3000Hz  4000Hz  6000Hz  8000Hz   Right ear:   Pass Pass Pass  Pass    Left ear:   Pass Pass Pass  Pass    Vision Screening Comments: Patient declined. Wants referral to eye doctor.  Dietary issues and exercise activities discussed: Current Exercise Habits: The patient does not participate in regular exercise at present, Exercise limited by: None identified  Goals    .  Patient Stated     " I don't have any goals."       Depression Screen PHQ 2/9 Scores 04/03/2018 02/06/2018 08/17/2017 08/17/2017 06/16/2017 04/27/2016 04/27/2016  PHQ - 2 Score - 4 4 0 0 3 0  PHQ- 9 Score - - 6 - - 6 -  Exception Documentation Other- indicate reason in comment box - - - - - -  Not completed diagnosed bipolar; major manic with anxiety; disability - - - - - -    Fall Risk Fall Risk  04/03/2018  Falls in the past year? 0    Is the patient's home free of loose throw rugs in walkways, pet beds, electrical cords, etc?   yes      Grab bars in the bathroom? no      Handrails on the stairs?   no      Adequate lighting?   yes   Cognitive Function: MMSE - Mini Mental State Exam 04/03/2018  Orientation to time 5  Orientation to Place 5  Registration 3  Attention/ Calculation 5  Recall 3  Language- name 2 objects 2  Language- repeat 1  Language- follow 3 step command 3  Language- read & follow direction 1  Write a sentence 1  Copy design 1  Total score 30     6CIT Screen 04/03/2018  What Year? 0 points  What month? 0 points  What time? 0 points  Count back from 20 0 points  Months in reverse 0 points  Repeat phrase 0 points  Total Score 0    Screening Tests Health Maintenance  Topic Date Due  . HIV Screening  03/21/1986  . MAMMOGRAM  05/20/2018  . PAP SMEAR-Modifier  04/28/2019  . TETANUS/TDAP  06/17/2027  . INFLUENZA VACCINE  Completed     Cancer Screenings: Lung: Low Dose CT Chest recommended if Age 56-80 years, 30 pack-year currently smoking OR have quit w/in 15years. Patient does not qualify. Breast: Up to date on Mammogram? Yes   Up to date of Bone Density/Dexa? N/A Colorectal: N/a    Plan:  Patient will need HIV screening at next visit with PCP. Requests referral to ophthalmololgy (entered). Also request referral to a private psychiatric provider for bipolar disorder, states she cannot tolerate crowds of people and cannot go to [facility] or places like that. Will route note to PCP. Also request refill of Naproxen, which will be sent to PCP under separate encounter.   I have personally reviewed and noted the following in the patient's chart:   . Medical and social history . Use of alcohol, tobacco or illicit drugs  . Current medications and supplements . Functional ability and status . Nutritional status . Physical activity . Advanced directives . List of other physicians . Hospitalizations, surgeries, and ER visits in previous 12 months . Vitals . Screenings to include cognitive, depression, and falls . Referrals and appointments  In addition, I have reviewed and discussed with patient certain preventive protocols, quality metrics, and best practice recommendations. A written personalized care plan for preventive services as well as general preventive health recommendations were provided to patient.     Nigel MormonAlisa S Kursten Kruk, RN   04/03/2018

## 2018-04-03 NOTE — Telephone Encounter (Signed)
Please let patient know status of referral to GI. It apparently had been sent to incorrect work queue. Patient has still not heard from GI.  Ples SpecterAlisa Liesel Peckenpaugh, RN [facility])

## 2018-04-03 NOTE — Patient Instructions (Addendum)
Diane Pacheco , Thank you for taking time to come for your Medicare Wellness Visit. I appreciate your ongoing commitment to your health goals. Please review the following plan we discussed and let me know if I can assist you in the future.   These are the goals we discussed: Goals    . Patient Stated     " I don't have any goals."        This is a list of the screening recommended for you and due dates:  Health Maintenance  Topic Date Due  . HIV Screening  03/21/1988  . Mammogram  05/20/2018  . Pap Smear  04/28/2019  . Tetanus Vaccine  06/17/2027  . Flu Shot  Completed    Steps to Quit Smoking Smoking tobacco can be bad for your health. It can also affect almost every organ in your body. Smoking puts you and people around you at risk for many serious long-lasting (chronic) diseases. Quitting smoking is hard, but it is one of the best things that you can do for your health. It is never too late to quit. What are the benefits of quitting smoking? When you quit smoking, you lower your risk for getting serious diseases and conditions. They can include:  Lung cancer or lung disease.  Heart disease.  Stroke.  Heart attack.  Not being able to have children (infertility).  Weak bones (osteoporosis) and broken bones (fractures).  If you have coughing, wheezing, and shortness of breath, those symptoms may get better when you quit. You may also get sick less often. If you are pregnant, quitting smoking can help to lower your chances of having a baby of low birth weight. What can I do to help me quit smoking? Talk with your doctor about what can help you quit smoking. Some things you can do (strategies) include:  Quitting smoking totally, instead of slowly cutting back how much you smoke over a period of time.  Going to in-person counseling. You are more likely to quit if you go to many counseling sessions.  Using resources and support systems, such as: ? Agricultural engineernline chats with a  Veterinary surgeoncounselor. ? Phone quitlines. ? Automotive engineerrinted self-help materials. ? Support groups or group counseling. ? Text messaging programs. ? Mobile phone apps or applications.  Taking medicines. Some of these medicines may have nicotine in them. If you are pregnant or breastfeeding, do not take any medicines to quit smoking unless your doctor says it is okay. Talk with your doctor about counseling or other things that can help you.  Talk with your doctor about using more than one strategy at the same time, such as taking medicines while you are also going to in-person counseling. This can help make quitting easier. What things can I do to make it easier to quit? Quitting smoking might feel very hard at first, but there is a lot that you can do to make it easier. Take these steps:  Talk to your family and friends. Ask them to support and encourage you.  Call phone quitlines, reach out to support groups, or work with a Veterinary surgeoncounselor.  Ask people who smoke to not smoke around you.  Avoid places that make you want (trigger) to smoke, such as: ? Bars. ? Parties. ? Smoke-break areas at work.  Spend time with people who do not smoke.  Lower the stress in your life. Stress can make you want to smoke. Try these things to help your stress: ? Getting regular exercise. ? Deep-breathing exercises. ?  Yoga. ? Meditating. ? Doing a body scan. To do this, close your eyes, focus on one area of your body at a time from head to toe, and notice which parts of your body are tense. Try to relax the muscles in those areas.  Download or buy apps on your mobile phone or tablet that can help you stick to your quit plan. There are many free apps, such as QuitGuide from the [employer] developer for Disease Control and Prevention). You can find more support from smokefree.gov and other websites.  This information is not intended to replace advice given to you by your health care provider. Make sure you discuss any questions you have  with your health care provider. Document Released: 01/30/2009 Document Revised: 12/02/2015 Document Reviewed: 08/20/2014 Elsevier Interactive Patient Education  2018 [employer].

## 2018-04-03 NOTE — Telephone Encounter (Signed)
Referral placed. Pls call pt and inform.  

## 2018-04-03 NOTE — Telephone Encounter (Signed)
Patient in for AWV. Has bipolar with anxiety. Asks for referral to psychiatry but would like to see a private practitioner due to social anxiety. She cannot deal with crowds and noise at places like [facility].  Ples SpecterAlisa Brake, RN [facility])

## 2018-04-19 HISTORY — PX: HEMORRHOID SURGERY: SHX153

## 2018-05-01 DIAGNOSIS — H40013 Open angle with borderline findings, low risk, bilateral: Secondary | ICD-10-CM | POA: Diagnosis not present

## 2018-05-01 DIAGNOSIS — H524 Presbyopia: Secondary | ICD-10-CM | POA: Diagnosis not present

## 2018-05-04 DIAGNOSIS — L918 Other hypertrophic disorders of the skin: Secondary | ICD-10-CM | POA: Diagnosis not present

## 2018-05-04 DIAGNOSIS — Z1211 Encounter for screening for malignant neoplasm of colon: Secondary | ICD-10-CM | POA: Diagnosis not present

## 2018-05-04 DIAGNOSIS — L290 Pruritus ani: Secondary | ICD-10-CM | POA: Diagnosis not present

## 2018-06-12 ENCOUNTER — Ambulatory Visit: Payer: Self-pay | Admitting: Surgery

## 2018-06-12 DIAGNOSIS — Z720 Tobacco use: Secondary | ICD-10-CM | POA: Diagnosis not present

## 2018-06-12 DIAGNOSIS — K644 Residual hemorrhoidal skin tags: Secondary | ICD-10-CM | POA: Diagnosis not present

## 2018-06-12 DIAGNOSIS — K642 Third degree hemorrhoids: Secondary | ICD-10-CM | POA: Diagnosis not present

## 2018-06-12 DIAGNOSIS — K641 Second degree hemorrhoids: Secondary | ICD-10-CM | POA: Diagnosis not present

## 2018-06-13 ENCOUNTER — Ambulatory Visit: Payer: Self-pay | Admitting: Surgery

## 2018-06-13 NOTE — H&P (Signed)
Diane Pacheco Documented: 06/12/2018 10:28 AM Location: [facility] Patient #: [id number] DOB: Jun 18, 1970 Separated / Language: [name] / Race: White Female  History of Present Illness Diane Sportsman MD; 06/13/2018 8:10 AM) The patient is a 48 year old female who presents with hemorrhoids. Note for "Hemorrhoids": ` ` ` Patient sent for surgical consultation at the request of Dr Presley Raddle GI  Chief Complaint: Painful symptomatic external hemorrhoids. ` ` The patient is a pleasant woman that has struggled with hemorrhoids for several decades. She felt a mass there for almost 20 years. She's tried to tolerated. More recently it seems to pop out. This got larger in size. It is getting more difficult to keep the area clean. Increasing discomfort. Which to have something more aggressive done. Surgical consultation offered after discussing with gastroenterology. There is discussion about getting a colonoscopy but given her age less than 50 and no severe indications, insurance denied. She moves her bowels about 3 times a day. She continues to smoke but is down to half pack a day. She can walk several miles without difficulty. She is not diabetic.  No personal nor family history of GI/colon cancer, inflammatory bowel disease, irritable bowel syndrome, allergy such as Celiac Sprue, dietary/dairy problems, colitis, ulcers nor gastritis. No recent sick contacts/gastroenteritis. No travel outside the country. No changes in diet. No dysphagia to solids or liquids. No significant heartburn or reflux. No hematochezia, hematemesis, coffee ground emesis. No evidence of prior gastric/peptic ulceration.  (Review of systems as stated in this history (HPI) or in the review of systems. Otherwise all other 12 point ROS are negative) ` ` `   Past Surgical History Maurilio Lovely, CMA; 06/12/2018 10:28 AM) Oral Surgery  Diagnostic Studies History Maurilio Lovely, CMA;  06/12/2018 10:28 AM) Colonoscopy never Mammogram within last year Pap Smear 1-5 years ago  Allergies Maurilio Lovely, CMA; 06/12/2018 10:29 AM) No Known Drug Allergies [06/12/2018]: Allergies Reconciled  Medication History Maurilio Lovely, CMA; 06/12/2018 10:29 AM) Naproxen DR (500MG  Tablet DR, Oral) Active. Medications Reconciled  Social History Maurilio Lovely, CMA; 06/12/2018 10:28 AM) Alcohol use Occasional alcohol use. Caffeine use Coffee. Illicit drug use Uses socially only. Tobacco use Current every day smoker.  Family History Maurilio Lovely, CMA; 06/12/2018 10:28 AM) Arthritis Mother. Heart Disease Mother. Heart disease in female family member before age 28 Migraine Headache Mother. Respiratory Condition Mother.  Pregnancy / Birth History Maurilio Lovely, CMA; 06/12/2018 10:28 AM) Age at menarche 14 years. Gravida 3 Length (months) of breastfeeding 7-12 Maternal age 47-20 Para 3 Regular periods  Other Problems Maurilio Lovely, CMA; 06/12/2018 10:28 AM) Anxiety Disorder Chronic Obstructive Lung Disease Depression Hypercholesterolemia     Review of Systems Maurilio Lovely CMA; 06/12/2018 10:28 AM) General Present- Night Sweats. Not Present- Appetite Loss, Chills, Fatigue, Fever, Weight Gain and Weight Loss. Skin Not Present- Change in Wart/Mole, Dryness, Hives, Jaundice, New Lesions, Non-Healing Wounds, Rash and Ulcer. HEENT Not Present- Earache, Hearing Loss, Hoarseness, Nose Bleed, Oral Ulcers, Ringing in the Ears, Seasonal Allergies, Sinus Pain, Sore Throat, Visual Disturbances, Wears glasses/contact lenses and Yellow Eyes. Respiratory Present- Snoring. Not Present- Bloody sputum, Chronic Cough, Difficulty Breathing and Wheezing. Breast Not Present- Breast Mass, Breast Pain, Nipple Discharge and Skin Changes. Cardiovascular Not Present- Chest Pain, Difficulty Breathing Lying Down, Leg Cramps, Palpitations, Rapid Heart Rate, Shortness of Breath and  Swelling of Extremities. Gastrointestinal Not Present- Abdominal Pain, Bloating, Bloody Stool, Change in Bowel Habits, Chronic diarrhea, Constipation, Difficulty Swallowing, Excessive gas, Gets full quickly at  meals, Hemorrhoids, Indigestion, Nausea, Rectal Pain and Vomiting. Female Genitourinary Not Present- Frequency, Nocturia, Painful Urination, Pelvic Pain and Urgency. Musculoskeletal Not Present- Back Pain, Joint Pain, Joint Stiffness, Muscle Pain, Muscle Weakness and Swelling of Extremities. Neurological Not Present- Decreased Memory, Fainting, Headaches, Numbness, Seizures, Tingling, Tremor, Trouble walking and Weakness. Endocrine Not Present- Cold Intolerance, Excessive Hunger, Hair Changes, Heat Intolerance, Hot flashes and New Diabetes. Hematology Not Present- Blood Thinners, Easy Bruising, Excessive bleeding, Gland problems, HIV and Persistent Infections.  Vitals Maurilio Lovely CMA; 06/12/2018 10:30 AM) 06/12/2018 10:29 AM Weight: 160 lb Height: 65in Body Surface Area: 1.8 m Body Mass Index: 26.63 kg/m  Temp.: 99.51F(Oral)  Pulse: 103 (Regular)  BP: 112/72 (Sitting, Left Arm, Standard)      Physical Exam Diane Sportsman MD; 06/12/2018 11:00 AM)  General Mental Status-Alert. General Appearance-Not in acute distress, Not Sickly. Orientation-Oriented X3. Hydration-Well hydrated. Voice-Normal. Note: Smiling. Not toxic. Not sickly  Integumentary Global Assessment Upon inspection and palpation of skin surfaces of the - Axillae: non-tender, no inflammation or ulceration, no drainage. and Distribution of scalp and body hair is normal. General Characteristics Temperature - normal warmth is noted.  Head and Neck Head-normocephalic, atraumatic with no lesions or palpable masses. Face Global Assessment - atraumatic, no absence of expression. Neck Global Assessment - no abnormal movements, no bruit auscultated on the right, no bruit auscultated on the  left, no decreased range of motion, non-tender. Trachea-midline. Thyroid Gland Characteristics - non-tender.  Eye Eyeball - Left-Extraocular movements intact, No Nystagmus. Eyeball - Right-Extraocular movements intact, No Nystagmus. Cornea - Left-No Hazy. Cornea - Right-No Hazy. Sclera/Conjunctiva - Left-No scleral icterus, No Discharge. Sclera/Conjunctiva - Right-No scleral icterus, No Discharge. Pupil - Left-Direct reaction to light normal. Pupil - Right-Direct reaction to light normal.  ENMT Ears Pinna - Left - no drainage observed, no generalized tenderness observed. Right - no drainage observed, no generalized tenderness observed. Nose and Sinuses External Inspection of the Nose - no destructive lesion observed. Inspection of the nares - Left - quiet respiration. Right - quiet respiration. Mouth and Throat Lips - Upper Lip - no fissures observed, no pallor noted. Lower Lip - no fissures observed, no pallor noted. Nasopharynx - no discharge present. Oral Cavity/Oropharynx - Tongue - no dryness observed. Oral Mucosa - no cyanosis observed. Hypopharynx - no evidence of airway distress observed.  Chest and Lung Exam Inspection Movements - Normal and Symmetrical. Accessory muscles - No use of accessory muscles in breathing. Palpation Palpation of the chest reveals - Non-tender. Auscultation Breath sounds - Normal and Clear.  Cardiovascular Auscultation Rhythm - Regular. Murmurs & Other Heart Sounds - Auscultation of the heart reveals - No Murmurs and No Systolic Clicks.  Abdomen Inspection Inspection of the abdomen reveals - No Visible peristalsis and No Abnormal pulsations. Umbilicus - No Bleeding, No Urine drainage. Palpation/Percussion Palpation and Percussion of the abdomen reveal - Soft, Non Tender, No Rebound tenderness, No Rigidity (guarding) and No Cutaneous hyperesthesia. Note: Abdomen soft. Periumbilical incision consistent with prior tubal  ligation. No hernia. Mild diastases recti. Obese but soft. Not severely distended. No umbilical or other anterior abdominal wall hernias  Female Genitourinary Sexual Maturity Tanner 5 - Adult hair pattern. Note: No vaginal bleeding nor discharge  Rectal Note: Please refer to anoscopy section.  Right posterior greater than left lateral external hemorrhoids. Mildly increased sphincter tone with suspicious anterior midline fissure. Sensitive. Some mild chronic pruritus. Enlarged internal hemorrhoids grade 2. Probable partial prolapsing grade 3 on left lateral pile  Peripheral Vascular Upper Extremity Inspection - Left - No Cyanotic nailbeds, Not Ischemic. Right - No Cyanotic nailbeds, Not Ischemic.  Neurologic Neurologic evaluation reveals -normal attention span and ability to concentrate, able to name objects and repeat phrases. Appropriate fund of knowledge , normal sensation and normal coordination. Mental Status Affect - not angry, not paranoid. Cranial Nerves-Normal Bilaterally. Gait-Normal.  Neuropsychiatric Mental status exam performed with findings of-able to articulate well with normal speech/language, rate, volume and coherence, thought content normal with ability to perform basic computations and apply abstract reasoning and no evidence of hallucinations, delusions, obsessions or homicidal/suicidal ideation.  Musculoskeletal Global Assessment Spine, Ribs and Pelvis - no instability, subluxation or laxity. Right Upper Extremity - no instability, subluxation or laxity.  Lymphatic Head & Neck  General Head & Neck Lymphatics: Bilateral - Description - No Localized lymphadenopathy. Axillary  General Axillary Region: Bilateral - Description - No Localized lymphadenopathy. Femoral & Inguinal  Generalized Femoral & Inguinal Lymphatics: Left - Description - No Localized lymphadenopathy. Right - Description - No Localized lymphadenopathy.    Assessment & Plan  Diane Sportsman MD; 06/13/2018 8:09 AM)  PROLAPSED INTERNAL HEMORRHOIDS, GRADE 3 (K64.2) Impression: Suspect her hemorrhoids are partially prolapsing which explains her history. Plan ligation and pexy in addition to planned external hemorrhoidectomy. I did caution her she will struggle with sharp pain in the first week or so despite doing an aggressive anorectal block. Trying stay on top of her pain. She helped her mother go through hemorrhoidectomy & is wide open about expectations.  She may have a slight anterior midline anal fissure which is atypical location but somewhat correlates to her complaints of sharp pain with defecation/dyskinesia. We will evaluate the time of examination under anesthesia. Doubt I will have to do any partial internal sphincterotomy, but we will see. She is not at high risk for fecal incontinence postoperatively.  The anatomy & physiology of the anorectal region was discussed. The pathophysiology of hemorrhoids and differential diagnosis was discussed. Natural history progression was discussed. I stressed the importance of a bowel regimen to have daily soft bowel movements to minimize progression of disease. Goal of one BM / day ideal. Use of wet wipes, warm baths, avoiding straining, etc were emphasized.  Educational handouts further explaining the pathology, treatment options, and bowel regimen were given as well. The patient expressed understanding.   PROLAPSED INTERNAL HEMORRHOIDS, GRADE 2 (K64.1)   EXTERNAL HEMORRHOIDS WITH COMPLICATION (K64.4) Impression: Cannot remove external hemorrhoids without general anesthesia. She desires that as well. Most likely some internal component as well. Plan surgical resection.  Current Plans ANOSCOPY, DIAGNOSTIC (47829) You are being scheduled for surgery- Our schedulers will call you.  You should hear from our office's scheduling department within 5 working days about the location, date, and time of surgery. We try to  make accommodations for patient's preferences in scheduling surgery, but sometimes the OR schedule or the surgeon's schedule prevents [place] from making those accommodations.  If you have not heard from our office [phone]) in 5 working days, call the office and ask for your surgeon's nurse.  If you have other questions about your diagnosis, plan, or surgery, call the office and ask for your surgeon's nurse.  Pt Education - [facility] Hemorrhoids (Izaiah Tabb): discussed with patient and provided information. Pt Education - Pamphlet Given - The Hemorrhoid Book: discussed with patient and provided information.  ENCOUNTER FOR PREOPERATIVE EXAMINATION FOR GENERAL SURGICAL PROCEDURE (Q46.962)  Current Plans The anatomy & physiology of the anorectal region was  discussed. The pathophysiology of hemorrhoids and differential diagnosis was discussed. Natural history risks without surgery was discussed. I stressed the importance of a bowel regimen to have daily soft bowel movements to minimize progression of disease. Interventions such as sclerotherapy & banding were discussed.  The patient's symptoms are not adequately controlled by medicines and other non-operative treatments. I feel the risks & problems of no surgery outweigh the operative risks; therefore, I recommended surgery to treat the hemorrhoids by ligation, pexy, and possible resection.  Risks such as bleeding, infection, urinary difficulties, need for further treatment, heart attack, death, and other risks were discussed. I noted a good likelihood this will help address the problem. Goals of post-operative recovery were discussed as well. Possibility that this will not correct all symptoms was explained. Post-operative pain, bleeding, constipation, and other problems after surgery were discussed. We will work to minimize complications. Educational handouts further explaining the pathology, treatment options, and bowel regimen were given as  well. Questions were answered. The patient expresses understanding & wishes to proceed with surgery.  Pt Education - [facility] Rectal Prep for Anorectal outpatient/office surgery: discussed with patient and provided information. Pt Education - [facility] Rectal Surgery HCI (Annaya Bangert): discussed with patient and provided information.  TOBACCO ABUSE (Z72.0)  Current Plans Pt Education - [facility] STOP SMOKING!  Diane Sportsman, MD, FACS, MASCRS Gastrointestinal and Minimally Invasive Surgery    [address] [place], [place] [address] [phone] Main / Paging [phone] Fax

## 2018-06-29 NOTE — Progress Notes (Signed)
I have reviewed this visit and agree with the documentation.   

## 2018-09-06 ENCOUNTER — Other Ambulatory Visit: Payer: Self-pay | Admitting: Surgery

## 2018-09-06 DIAGNOSIS — K644 Residual hemorrhoidal skin tags: Secondary | ICD-10-CM | POA: Diagnosis not present

## 2018-09-06 DIAGNOSIS — K649 Unspecified hemorrhoids: Secondary | ICD-10-CM | POA: Diagnosis not present

## 2018-09-06 DIAGNOSIS — K642 Third degree hemorrhoids: Secondary | ICD-10-CM | POA: Diagnosis not present

## 2018-09-22 ENCOUNTER — Other Ambulatory Visit: Payer: Self-pay

## 2018-09-22 ENCOUNTER — Ambulatory Visit (INDEPENDENT_AMBULATORY_CARE_PROVIDER_SITE_OTHER): Payer: Medicare Other | Admitting: Student in an Organized Health Care Education/Training Program

## 2018-09-22 ENCOUNTER — Encounter: Payer: Self-pay | Admitting: Student in an Organized Health Care Education/Training Program

## 2018-09-22 DIAGNOSIS — N9089 Other specified noninflammatory disorders of vulva and perineum: Secondary | ICD-10-CM | POA: Diagnosis not present

## 2018-09-22 MED ORDER — LIDOCAINE HCL 2 % EX CREA: 1.0000 | TOPICAL_CREAM | Freq: Three times a day (TID) | CUTANEOUS | 0 refills | Status: DC | PRN

## 2018-09-22 NOTE — Progress Notes (Signed)
   Subjective:    Patient ID: Diane Pacheco, female    DOB: 29-Sep-1970, 48 y.o.   MRN: [id number]   CC: vaginal pain  HPI: Patient endorses vaginal pain since hemorrhoid surgery about a week ago. Denies abnormal vaginal discharge but does have "swamp butt". Minimal bleeding from surgical area but none from vagina. Denies dysuria, or hematuria. Denies pruritus. Has pain at surgical site and also at anterior vagina. Husband denied seeing any rash. Has never experienced this before. Did not have any issues prior to surgery.  Smoking status reviewed   ROS: pertinent noted in the HPI   Past Medical History:  Diagnosis Date  . Asthma    exacerbations w/ respiratory infxns. Well controlled w/ abuterol PRN  . Condyloma acuminatum   . COPD (chronic obstructive pulmonary disease) (HCC)   . Major depression    Unable to take antidepressents due to intolerance. Prefers to handle w/o Rx    Past Surgical History:  Procedure Laterality Date  . TUBAL LIGATION      Past medical history, surgical, family, and social history reviewed and updated in the EMR as appropriate.  Objective:  BP 122/78   Pulse 84   LMP 09/08/2018 (Exact Date)   SpO2 98%   Vitals and nursing note reviewed  General: NAD, pleasant, able to participate in exam Pelvic: negative for rashes, erythema, swelling, excoriations. 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 Non bleeding or draining.  Extremities: no edema or cyanosis. Skin: warm and dry, no rashes noted Neuro: alert, no obvious focal deficits Psych: Normal affect and mood   Assessment & Plan:    Fissure of vulva Likely 2/2 injury sustained in hemorrhoid surgical process. Patient has had anal fissures previously. Prescribed topical lidocaine for pain. Recommended sitz baths and normal hygiene to the area approved by surgeon.  Prescribed topical nifedipine to be used short term although I'm  unsure of its efficacy in the current location.  Asked patient to come back if not improved after use.    Jamelle Rushing, DO [facility] PGY-1

## 2018-09-22 NOTE — Patient Instructions (Addendum)
It was a pleasure to see you today!  To summarize our discussion for this visit:  Fissure to be treated with good hygiene, sitz baths, pain with topical lidocaine  I will be trying to call in a prescription for another topical cream to help with healing.   Call the clinic at [phone] if your symptoms worsen or you have any concerns.  Thank you for allowing me to take part in your care,  Dr. Jamelle Rushing   Thanks for choosing [facility] for your primary care.   Anal Fissure, Adult  An anal fissure is a small tear or crack in the tissue around the opening of the butt (anus). Bleeding from the tear or crack usually stops on its own within a few minutes. The bleeding may happen every time you poop (have a bowel movement) until the tear or crack heals. What are the causes? This condition is usually caused by passing a large or hard poop (stool). Other causes include:  Trouble pooping (constipation).  Passing watery poop (diarrhea).  Inflammatory bowel disease (Crohn's disease or ulcerative colitis).  Childbirth.  Infections.  Anal sex. What are the signs or symptoms? Symptoms of this condition include:  Bleeding from the butt.  Small amounts of blood on your poop. The blood coats the outside of the poop. It is not mixed with the poop.  Small amounts of blood on the toilet paper or in the toilet after you poop.  Pain when passing poop.  Itching or irritation around the opening of the butt. How is this diagnosed? This condition may be diagnosed based on a physical exam. Your doctor may:  Check your butt. A tear can often be seen by checking the area with care.  Check your butt using a short tube (anoscope). The light in the tube will show any problems in your butt. How is this treated? Treatment for this condition may include:  Treating problems that make it hard for you to pass poop. You may be told to: ? Eat more fiber. ? Drink more fluid. ? Take  fiber supplements. ? Take medicines that make poop soft.  Taking sitz baths. This may help to heal the tear.  Using creams and ointments. If your condition gets worse, other treatments may be needed such as:  A shot near the tear or crack (botulinum injection).  Surgery to repair the tear or crack. Follow these instructions at home: Eating and drinking   Avoid bananas and dairy products. These foods can make it hard to poop.  Drink enough fluid to keep your pee (urine) pale yellow.  Eat foods that have a lot of fiber in them, such as: ? Beans. ? Whole grains. ? Fresh fruits. ? Fresh vegetables. General instructions   Take over-the-counter and prescription medicines only as told by your doctor.  Use creams or ointments only as told by your doctor.  Keep the butt area as clean and dry as you can.  Take a warm water bath (sitz bath) as told by your doctor. Do not use soap.  Keep all follow-up visits as told by your doctor. This is important. Contact a doctor if:  You have more bleeding.  You have a fever.  You have watery poop that is mixed with blood.  You have pain.  Your problem gets worse, not better. Summary  An anal fissure is a small tear or crack in the skin around the opening of the butt (anus).  This condition is usually caused  by passing a large or hard poop (stool).  Treatment includes treating the problems that make it hard for you to pass poop.  Follow your doctor's instructions about caring for your condition at home.  Keep all follow-up visits as told by your doctor. This is important. This information is not intended to replace advice given to you by your health care provider. Make sure you discuss any questions you have with your health care provider. Document Released: 12/02/2010 Document Revised: 09/15/2017 Document Reviewed: 09/15/2017 Elsevier Interactive Patient Education  2019 [employer]

## 2018-09-25 ENCOUNTER — Telehealth: Payer: Self-pay

## 2018-09-25 NOTE — Telephone Encounter (Signed)
Spoke with pharmacist and they are unable to get lidocaine 2%. The available options are 3%, 4% or the 5% specifically for the anal-rectal area. This comes in a 30 gram tube. Please resend or I can given them a verbal.

## 2018-09-25 NOTE — Telephone Encounter (Signed)
Is this something you guys could send in for her? She is calling again, and in a lot of pain. As well as being super frustrated. Thanks!

## 2018-09-25 NOTE — Assessment & Plan Note (Signed)
Likely 2/2 injury sustained in hemorrhoid surgical process. Patient has had anal fissures previously. Prescribed topical lidocaine for pain. Recommended sitz baths and normal hygiene to the area approved by surgeon.  Prescribed topical nifedipine to be used short term although I'm unsure of its efficacy in the current location.  Asked patient to come back if not improved after use.

## 2018-09-25 NOTE — Telephone Encounter (Signed)
Verbal given to pharmacist for 5% Lidocaine 30 gram tube with no refills, per preceptors. Patient has been notified and appreciative.

## 2018-09-25 NOTE — Telephone Encounter (Signed)
Pharmacy calling about Lidocaine cream. Directions say apply 1 squirt, but that isn't how a cream is dosed. Please call pharmacy with Otho Darner, directions for use.[phone]

## 2019-01-29 ENCOUNTER — Other Ambulatory Visit: Payer: Self-pay

## 2019-01-29 ENCOUNTER — Telehealth (INDEPENDENT_AMBULATORY_CARE_PROVIDER_SITE_OTHER): Payer: Medicare Other | Admitting: Family Medicine

## 2019-01-29 DIAGNOSIS — Z20828 Contact with and (suspected) exposure to other viral communicable diseases: Secondary | ICD-10-CM

## 2019-01-29 DIAGNOSIS — Z20822 Contact with and (suspected) exposure to covid-19: Secondary | ICD-10-CM

## 2019-01-29 NOTE — Progress Notes (Signed)
Virtual Visit via Video Note  I connected with Diane Pacheco on 01/29/19 at  8:50 AM EDT by a video enabled telemedicine application and verified that I am speaking with the correct person using two identifiers.  Location: Patient: At home Provider: [facility]   I discussed the limitations of evaluation and management by telemedicine and the availability of in person appointments. The patient expressed understanding and agreed to proceed.  History of Present Illness: Patient with worsening cough and sore throat, no fevers, no change in taste.  Husband is feeling fine.  She also cares for her mother in a neighboring town.  Husband is largely invalid and stays in the house.  He has had no symptoms, mother is had no symptoms.  She is able to get other siblings to care for her mother during quarantine.   Observations/Objective: Speaking full sentences, no significant cough noted during our video.  She was appropriately processing information did not appear to be in distress  Assessment and Plan: Self-isolation is much as possible from family until results of drive-through coronavirus test which is now been ordered.  Patient will go to [facility] this morning and get this test done.  She is also been instructed that if this comes back positive she will isolate for 14 days.  We went over symptoms appropriate for home isolation versus emergency department and patient understands.  Follow Up Instructions:    I discussed the assessment and treatment plan with the patient. The patient was provided an opportunity to ask questions and all were answered. The patient agreed with the plan and demonstrated an understanding of the instructions.   The patient was advised to call back or seek an in-person evaluation if the symptoms worsen or if the condition fails to improve as anticipated.  I provided 9 minutes of non-face-to-face time during this encounter.   Sherene Sires, DO

## 2019-01-30 LAB — NOVEL CORONAVIRUS, NAA: SARS-CoV-2, NAA: NOT DETECTED

## 2019-02-01 ENCOUNTER — Encounter: Payer: Self-pay | Admitting: Family Medicine

## 2019-02-06 DIAGNOSIS — Z23 Encounter for immunization: Secondary | ICD-10-CM | POA: Diagnosis not present

## 2019-06-14 ENCOUNTER — Telehealth: Payer: Self-pay | Admitting: *Deleted

## 2019-06-14 NOTE — Telephone Encounter (Signed)
Tried to contact pt to go over screening questions, no answer and VM not set up yet. Diane Pacheco, CMA

## 2019-06-15 ENCOUNTER — Ambulatory Visit (INDEPENDENT_AMBULATORY_CARE_PROVIDER_SITE_OTHER): Payer: Medicare Other | Admitting: Family Medicine

## 2019-06-15 ENCOUNTER — Encounter: Payer: Self-pay | Admitting: Family Medicine

## 2019-06-15 ENCOUNTER — Telehealth: Payer: Self-pay | Admitting: *Deleted

## 2019-06-15 ENCOUNTER — Other Ambulatory Visit: Payer: Self-pay | Admitting: Family Medicine

## 2019-06-15 ENCOUNTER — Other Ambulatory Visit: Payer: Self-pay

## 2019-06-15 VITALS — BP 120/68 | HR 85 | Ht 65.0 in | Wt 168.5 lb

## 2019-06-15 DIAGNOSIS — F17200 Nicotine dependence, unspecified, uncomplicated: Secondary | ICD-10-CM

## 2019-06-15 DIAGNOSIS — R457 State of emotional shock and stress, unspecified: Secondary | ICD-10-CM | POA: Diagnosis not present

## 2019-06-15 DIAGNOSIS — J449 Chronic obstructive pulmonary disease, unspecified: Secondary | ICD-10-CM | POA: Diagnosis not present

## 2019-06-15 DIAGNOSIS — Z1231 Encounter for screening mammogram for malignant neoplasm of breast: Secondary | ICD-10-CM

## 2019-06-15 MED ORDER — UMECLIDINIUM BROMIDE 62.5 MCG/INH IN AEPB: 1.0000 | INHALATION_SPRAY | Freq: Every morning | RESPIRATORY_TRACT | 3 refills | Status: DC

## 2019-06-15 MED ORDER — TIOTROPIUM BROMIDE MONOHYDRATE 18 MCG IN CAPS: 18.0000 ug | ORAL_CAPSULE | Freq: Every morning | RESPIRATORY_TRACT | 12 refills | Status: DC

## 2019-06-15 MED ORDER — ALBUTEROL SULFATE HFA 108 (90 BASE) MCG/ACT IN AERS: 1.0000 | INHALATION_SPRAY | Freq: Four times a day (QID) | RESPIRATORY_TRACT | 0 refills | Status: DC | PRN

## 2019-06-15 NOTE — Patient Instructions (Signed)
Psychiatry Resource List (Adults and Children) Most of these providers will take Medicaid. please consult your insurance for a complete and updated list of available providers. When calling to make an appointment have your insurance information available to confirm you are covered.  [facility]:   [place]: [address]     [phone]   [place]: [address],        [phone] : [address],    [phone] [place]: [address],                   [phone]  [facility]  (Psychiatry & counseling ; adults & children ; will take Medicaid) [address], [place], [place]        [phone]   [facility]  (Psychiatry only; Adults only, will take Medicaid)  [address], [place], [place] [address]       [phone]   [facility] (Psychiatry & counseling ; adults & children ; will take Medicaid [address]  [place] [address]   Go on-line to complete referral ( [email or web address] [phone]    (Spanish therapist)  [facility]  Psychiatry & counseling; Adults and children;  Call Registration prior to scheduling an appointment [phone] [address]    [place], [place] [address]    [phone]  [facility] (Psychiatry & counseling; adults & children; Medicare no Medicaid)  [address]   [place], [place]  [address]      [phone]    [facility] (up to age 21)  Psychiatry & counseling ,will take Medicaid, must do counseling to receive psychiatry services  [address] [place] [address]        [phone]  [facility] (Psychiatry & counseling; adults & children; will take Medicaid) Will need a referral from provider [address],  [place], [place]  [phone]  [facility]---  Walk-in Mon-Fri,  8:30-5:00 (will take Medicaid)  [address], [place], [place]  [phone]    [facility] --- Walk-In Mon-Friday 8am-3pm ( will take Medicaid, Psychiatry, Adults & children,  [address], [place], [place]   [phone]   [facility]--, Walk-in M-F 8am-12pm and 1pm -3pm   (Counseling, Psychiatry, will take Medicaid, adults & children)  [address], [place],   [phone]   

## 2019-06-15 NOTE — Telephone Encounter (Signed)
PA needed for spiriva. Looks like atrovent HFA and incruse ellipta are preferred meds.  Has pt tried and failed these?   Cover My Meds info: Key: [id number]  Jone Baseman, CMA

## 2019-06-15 NOTE — Assessment & Plan Note (Signed)
Counseled about risk associated with smoking.  Patient has tried multiple things in the past and declines any NRT or other medications due to side effects.

## 2019-06-15 NOTE — Assessment & Plan Note (Signed)
Also with a history of bipolar disorder not on a mood stabilizing medication. -List of outpatient behavioral specialist given to patient

## 2019-06-15 NOTE — Assessment & Plan Note (Signed)
Patient with history of COPD A controlled on albuterol and 2 troponin has been without her medications and requests refill due to worsening symptoms.  No signs of acute exacerbation -Restart albuterol and Spiriva

## 2019-06-15 NOTE — Progress Notes (Signed)
Ambulatory walk  98%- 98BPM Aquilla Solian, CMA

## 2019-06-15 NOTE — Progress Notes (Signed)
    SUBJECTIVE:   CHIEF COMPLAINT / HPI:   COPD, mild, uncontrolled  Tobacco abuse Patient reports that she has been recently short of breath over the past few weeks.  This is not associated with worsening cough, fevers, chills, recent sick contacts.  Patient has been out of her albuterol rescue inhaler and said that this has helped her in the past.  She has also not been using her tiotropium which should she had had great control with this in the past as well.  Reports that her mother's recently had to move in her with her due to illness.  She is taking care of her mother now.  Patient smoking more cigarettes due to the stress.  Patient has tried multiple medications for both mood and smoking cessation in the past and was unable to tolerate these due to mania or other medication side effect.  PERTINENT  PMH / PSH:  As above OBJECTIVE:   BP 120/68   Pulse 85   Ht 5\' 5"  (1.651 m)   Wt 168 lb 8 oz (76.4 kg)   LMP 06/01/2019   SpO2 98%   BMI 28.04 kg/m   Gen: NAD, resting comfortably CV: RRR with no murmurs appreciated Pulm: NWOB, CTAB with no crackles, wheezes, or rhonchi MSK: no edema, cyanosis, or clubbing noted Skin: warm, dry Neuro: grossly normal, moves all extremities    ASSESSMENT/PLAN:   No problem-specific Assessment & Plan notes found for this encounter.     07/30/2019, MD [facility]

## 2019-06-18 NOTE — Telephone Encounter (Signed)
Patient calls nurse line checking on status of Spiriva authorization or if she needs to receive rx for different medication  To PCP  Veronda Prude, RN

## 2019-06-19 NOTE — Telephone Encounter (Signed)
Pt informed.  She was able to pick up the incruse yesterday.  Jone Baseman, CMA

## 2019-07-02 ENCOUNTER — Ambulatory Visit (INDEPENDENT_AMBULATORY_CARE_PROVIDER_SITE_OTHER): Payer: Medicare Other | Admitting: Licensed Clinical Social Worker

## 2019-07-02 DIAGNOSIS — F331 Major depressive disorder, recurrent, moderate: Secondary | ICD-10-CM

## 2019-07-02 DIAGNOSIS — F411 Generalized anxiety disorder: Secondary | ICD-10-CM

## 2019-07-02 NOTE — Progress Notes (Signed)
Virtual Visit via Telephone Note  I connected with Diane Pacheco on 07/02/19 at  9:00 AM EDT by telephone and verified that I am speaking with the correct person using two identifiers.   I discussed the limitations, risks, security and privacy concerns of performing an evaluation and management service by telephone and the availability of in person appointments. I also discussed with the patient that there may be a patient responsible charge related to this service. The patient expressed understanding and agreed to proceed.   I discussed the assessment and treatment plan with the patient. The patient was provided an opportunity to ask questions and all were answered. The patient agreed with the plan and demonstrated an understanding of the instructions.   The patient was advised to call back or seek an in-person evaluation if the symptoms worsen or if the condition fails to improve as anticipated.  I provided 60 minutes of non-face-to-face time during this encounter.   Comprehensive Clinical Assessment (CCA) Note  07/02/2019 Diane Pacheco [id number]  Visit Diagnosis:      ICD-10-CM   1. Major depressive disorder, recurrent episode, moderate (HCC)  F33.1   2. Generalized anxiety disorder  F41.1       CCA Part One  Part One has been completed on paper by the patient.  (See scanned document in Chart Review)  CCA Part Two A  Intake/Chief Complaint:  CCA Intake With Chief Complaint CCA Part Two Date: 07/02/19 CCA Part Two Time: 0911 Chief Complaint/Presenting Problem: Had to take mom to 24 hour care, sick in and out of care, husband disabled and she is taking care of him, he requires dressing and helping with the bathroom and everything else. Everything at once. Patient has a history of depression and bipolar. Patients Currently Reported Symptoms/Problems: anxiety, depression Collateral Involvement: would talk to mom, would vent to her but hard to vent to the person you want  to vent to. Miss having also someone to talk to as well. Can vent about husband. Mom had another heart stent put in, COPD bad, wheel chair is new, husband has stroke and aphasia and wants her home and doesn't understand if not with patient will be in a home, sister leukemia, brother can't help so all on patient. Doesn't identify a support. Lives with husband, mom, not really husband but long term partner. With mental problems had kids taken away, finally at a good point and now this is happening and doesn't want to end up in a hospital. Individual's Strengths: good caretaker Individual's Preferences: anxiety and depression address and found that talking to people tends to help Individual's Abilities: I don't do anything other than hang out with my husband. I don't like being out in public why haven't had counseling. Can't be in a waiting room with a lot of people. Just at the point where can go to [employer] and [address]. Dad had to do it for her. Used to be really bad. Type of Services Patient Feels Are Needed: therapy Initial Clinical Notes/Concerns: Depression continue-suicide attempts in the past-12-13 years ago by OD that is why doesn't do pills. Medical issues-slight COPD, family history-mom very anxious, daughter very anxious, anxiety is very prevalent. Patient's heart rate runs 113. past history of treatment-in and out of hospitals years ago, tried every kind of medication, very sensitive to it, don't like pills they have me so messed up. Started treatment after 2nd child 22 years. Last time in treatment 3 years ago in [place].  Mental  Health Symptoms Depression:  Depression: Increase/decrease in appetite, Sleep (too much or little), Tearfulness, Irritability, Difficulty Concentrating(a little bit of sadness, tired but don't have time to be tired, whole world fall apart without patient. Rates depression as 5 or 6 out of 10 with 10 being the worst)  Mania:  Mania: Irritability(wants to do things but  spin in circles unorganized, doesn't feel increased energy has had manic episode. Last time two years ago.)  Anxiety:   Anxiety: Irritability, Difficulty concentrating, Sleep, Worrying, Tension, Restlessness  Psychosis:     Trauma:     Obsessions:     Compulsions:     Inattention:     Hyperactivity/Impulsivity:     Oppositional/Defiant Behaviors:     Borderline Personality:     Other Mood/Personality Symptoms:  Other Mood/Personality Symptoms: Anxiety-rates anxiety as 8 out of 10 with 10 being the worst. over the years, used to have bad panic attacks, scratching herself. Used to bite herself, would scratch until bloody. People know her know how to manage her and tell her to settle her hands. Still get panic attacks when have to go to grocery store-heart races, thoughts spin, breathing really fast.   Mental Status Exam Appearance and self-care  Stature:  Stature: Average  Weight:  Weight: Overweight  Clothing:     Grooming:     Cosmetic use:     Posture/gait:     Motor activity:     Sensorium  Attention:  Attention: Normal  Concentration:  Concentration: Normal  Orientation:  Orientation: X5  Recall/memory:  Recall/Memory: Normal  Affect and Mood  Affect:  Affect: Anxious  Mood:  Mood: Depressed, Anxious  Relating  Eye contact:  Eye Contact: Normal  Facial expression:     Attitude toward examiner:  Attitude Toward Examiner: Cooperative  Thought and Language  Speech flow: Speech Flow: Normal  Thought content:     Preoccupation:     Hallucinations:     Organization:     Company secretary of Knowledge:  Fund of Knowledge: Average  Intelligence:  Intelligence: Average  Abstraction:  Abstraction: Normal  Judgement:  Judgement: Fair  Dance movement psychotherapist:  Reality Testing: Realistic  Insight:  Insight: Fair  Decision Making:  Decision Making: Paralyzed  Social Functioning  Social Maturity:  Social Maturity: Isolates  Social Judgement:     Stress  Stressors:  Stressors:  Family conflict(being a caretaker to mom and husband, has to explain to stepdad why mom not coming home, cleans his house)  Coping Ability:  Coping Ability: Exhausted, Building surveyor Deficits:     Supports:      Family and Psychosocial History: Family history Marital status: (long term relationship-19-20 years. Patient is caretaker. Bad back problems, two major surgeries, overweight, heart failure. good relationship) Are you sexually active?: Yes(married twice) What is your sexual orientation?: heterosexual Has your sexual activity been affected by drugs, alcohol, medication, or emotional stress?: With his back problems limits it Does patient have children?: Yes How many children?: 3 How is patient's relationship with their children?: [name]-26, Zooey-21, [name]-18-proud of them. Youngest lived with dad his whole life. Raised [name] until 10 or 11. With mental health they had to keep staying with dad come back then go to dad. Court finally took them and gave to dad.  Childhood History:  Childhood History By whom was/is the patient raised?: Both parents, Grandparents Additional childhood history information: Paternal grandparents live with them, mom is from [place] Description of patient's relationship with caregiver when they  were a child: fine Patient's description of current relationship with people who raised him/her: dad-is fine 76 and working. Dad and mom divorced. The guy who had a stroke who we take care of it stepdad. Mom-been sickly for awhile. Put in hospital 3 separate times in the past month and half. Had to go to their house clean and cook and take to doctors. This last time wore out from going back and forth.  She is on 2 major blood thinners so she had to take her with her.  Gets along with both. How were you disciplined when you got in trouble as a child/adolescent?: n/a Does patient have siblings?: Yes Number of Siblings: 2 Description of patient's current relationship with  siblings: Older sister and younger brother-don't talk. they have their own lives. Did patient suffer any verbal/emotional/physical/sexual abuse as a child?: No Did patient suffer from severe childhood neglect?: No Has patient ever been sexually abused/assaulted/raped as an adolescent or adult?: No Was the patient ever a victim of a crime or a disaster?: No Witnessed domestic violence?: No Has patient been effected by domestic violence as an adult?: No  CCA Part Two B  Employment/Work Situation: Employment / Work Copywriter, advertising Employment situation: On disability Why is patient on disability: mental health How long has patient been on disability: 16-18 years What is the longest time patient has a held a job?: 2 years 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, 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 Did You Receive Any Psychiatric Treatment/Services While in the [employer]?: No Are There Guns or Other Weapons in [place]?: No  Education: Museum/gallery curator Currently Attending: no Last Grade Completed: 12(three months before graduate high school) Name of [place]: [place] Did Teacher, adult education From [employer]?: No Did You Have Any Chief Technology Officer In School?: like English and science Did You Have An Individualized Education Program (IIEP): No Did You Have Any Difficulty At [employer]?: No  Religion: Religion/Spirituality Are You A Religious Person?: No  Leisure/Recreation:    Exercise/Diet: Exercise/Diet Do You Exercise?: Yes What Type of Exercise Do You Do?: (Does not start moving all day but does not actually exercise) Have You Gained or Lost A Significant Amount of Weight in the Past Six Months?: No Do You Follow a Special Diet?: No(Tries to do low-sodium for everybody in the house) Do You Have Any Trouble Sleeping?: Yes Explanation of Sleeping Difficulties: up at 9 AM doesn't go to bed until 2 AM.  Sleeps hard every night out once her head hits the  pillow  CCA Part Two C  Alcohol/Drug Use: Alcohol / Drug Use Pain Medications: n/a Prescriptions: Albuterol inhaler prn Over the Counter: n/a History of alcohol / drug use?: Yes(used to be on drugs-on crack, cocaine, LSD, drinks socially marijuana) Substance #1 Name of Substance 1: marijuana 1 - Age of First Use: 18 1 - Amount (size/oz): joint 1 - Frequency: ever other day 1 - Duration: since 18 1 - Last Use / Amount: 07/01/18-joint                    CCA Part Three  ASAM's:  Six Dimensions of Multidimensional Assessment  Dimension 1:  Acute Intoxication and/or Withdrawal Potential:     Dimension 2:  Biomedical Conditions and Complications:     Dimension 3:  Emotional, Behavioral, or Cognitive Conditions and Complications:     Dimension 4:  Readiness to Change:     Dimension 5:  Relapse, Continued  use, or Continued Problem Potential:     Dimension 6:  Recovery/Living Environment:      Substance use Disorder (SUD)    Social Function:  Social Functioning Social Maturity: Isolates  Stress:  Stress Stressors: Family conflict(being a caretaker to mom and husband, has to explain to stepdad why mom not coming home, cleans his house) Coping Ability: Exhausted, Overwhelmed Patient Takes Medications The Way The Doctor Instructed?: Yes Priority Risk: Low Acuity  Risk Assessment- Self-Harm Potential: Risk Assessment For Self-Harm Potential Thoughts of Self-Harm: No current thoughts Method: No plan Availability of Means: No access/NA Additional Information for Self-Harm Potential: Previous Attempts Additional Comments for Self-Harm Potential: most recent was 12-13 years ago  Risk Assessment -Dangerous to Others Potential: Risk Assessment For Dangerous to Others Potential Method: No Plan Availability of Means: No access or NA Intent: Vague intent or NA Notification Required: No need or identified person  DSM5 Diagnoses: Patient Active Problem List   Diagnosis Date  Noted  . Caregiver stress syndrome 06/15/2019  . Fissure of vulva 09/22/2018  . Bunion of great toe of right foot 02/10/2018  . Hemorrhoids 02/10/2018  . Stress incontinence 08/17/2017  . Hyperlipidemia 05/27/2016  . Bursitis of shoulder 01/07/2015  . Bicipital tendinitis 01/07/2015  . Chronic obstructive pulmonary disease (HCC) 06/25/2013  . Bipolar disorder, unspecified (HCC) 06/19/2013  . Rotator cuff tendonitis 06/19/2013  . Obesity 07/15/2011  . TOBACCO USER 01/25/2009    Patient Centered Plan: Patient is on the following Treatment Plan(s):  Anxiety and Depression, stress management-treatment plan will be formulated at next treatment session  Recommendations for Services/Supports/Treatments: Recommendations for Services/Supports/Treatments Recommendations For Services/Supports/Treatments: Individual Therapy  Treatment Plan Summary: Patient is a 49 year old female self-referred relates past diagnosis of bipolar and anxiety has history of mental health treatment, presents to therapy relating dealing with significant stressors of being caretaker for mom, her partner, stepfather and feels treatment interventions will be helpful for her.  She is recommended for individual therapy to help her with stress management, decrease in depression anxiety, learn emotional regulation strategies as well as strength based and supportive interventions    Referrals to Alternative Service(s): Referred to Alternative Service(s):   Place:   Date:   Time:    Referred to Alternative Service(s):   Place:   Date:   Time:    Referred to Alternative Service(s):   Place:   Date:   Time:    Referred to Alternative Service(s):   Place:   Date:   Time:     Coolidge Breeze

## 2019-07-17 ENCOUNTER — Ambulatory Visit (INDEPENDENT_AMBULATORY_CARE_PROVIDER_SITE_OTHER): Payer: Medicare Other | Admitting: Licensed Clinical Social Worker

## 2019-07-17 DIAGNOSIS — F411 Generalized anxiety disorder: Secondary | ICD-10-CM

## 2019-07-17 DIAGNOSIS — F331 Major depressive disorder, recurrent, moderate: Secondary | ICD-10-CM | POA: Diagnosis not present

## 2019-07-17 NOTE — Progress Notes (Signed)
Virtual Visit via Video Note  I connected with Diane Pacheco on 07/17/19 at  8:00 AM EDT by a video enabled telemedicine application and verified that I am speaking with the correct person using two identifiers.   I discussed the limitations of evaluation and management by telemedicine and the availability of in person appointments. The patient expressed understanding and agreed to proceed.  I discussed the assessment and treatment plan with the patient. The patient was provided an opportunity to ask questions and all were answered. The patient agreed with the plan and demonstrated an understanding of the instructions.   The patient was advised to call back or seek an in-person evaluation if the symptoms worsen or if the condition fails to improve as anticipated.  I provided 55 minutes of non-face-to-face time during this encounter.   THERAPIST PROGRESS NOTE  Session Time: 8:00 AM to 8:55 AM  Participation Level: Active  Behavioral Response: CasualAlertAnxious and Dysphoric, tearful on occasion in session  Type of Therapy: Individual Therapy  Treatment Goals addressed:  stress management, supportive interventions to help patient be caretaker for many different people and family, decrease anxiety  Interventions: CBT, Solution Focused, Play Therapy, Strength-based, Supportive, Reframing and Other: Processing feelings of anticipatory grief, coping  Summary: Diane Pacheco is a 49 y.o. female who presents with being a caretaker. About ready to cry. Any time talk to anybody this is what you get. Overwhelmed with everything and worried about their health issues. Mostly worried about mom's health issues. Husband jealous all of this is new to them. He is used to getting her attention but now too tired. Hour or two to do games. Doesn't sit down though "a woman of many hats". Patient does everything while they sit there. It is not their fault they can't do it. Husband has started asking  how she is doing. Husband old school gruff. He is realizing more stressed then though. Mom has major heart issues. If she falls and hit her head could fall out and bleed out with two major blood thinners. Too high maintenance for stepfather to take care of. Just keeping her comfortable now. Moaning a lot, can't breath. Breaks her heart because can't do anything. Least with work get to go home but with patient her work is always there. Getting a way means going to the grocery store. Also mowing the grass is another break instant gratification. Canning vegetable for the entire family. Source of anxiety, caretaking, cooking, cleaning, shopping, getting baths, doing laundry. Has a dog, cat, bird, snake. Doing good if everybody is getting fed and watered for the day. Always been the caretaker. To granddad missing a leg, dad moved out and got married and left patient with. Patient was in 8th and 9th grade. Patient was the one cooking and cleaning after mom left. She left in 7th grade. Went from that into high school, got a car and too easy to not to go to school with car .She has three kids first one at 61 or 30 in 70. Relaxation. She does deep breathing, biofeedback tense and relax the muscles. But that helps her tell which muscles are relaxed and which ones are tense so can relax those specific areas. Reviewed coping skill and the best one is to keep going. Older people has on retainer mother-in-law and dad. Smoking a little too much because of stress. Let out some of my distress, some of the feelings about my mom helps.   Suicidal/Homicidal: No  Therapist Response: Therapist  reviewed symptoms, facilitated expression of thoughts and feelings to help patient with coping with her feelings and stressors.  Assessed cathartic for patient to talk about her mom anticipatory grief related to seeing her mom get sicker.  Courage self-care.  Pointed out patient's strengths in being able to take care of family, recognizing this  is valuable.  Encouraged patient with her own self talk that she utilized in session of self compassion, lowering our expectations of herself as a positive thing to take care of self and decrease stress.  Encourage patient to pay attention to self talk, to our thoughts and noticed any negative trends, changing negative thoughts house the power to change her mood.  Reviewed CBT model for anxiety discussing how the source of anxiety is Art therapist, that what keeps the cycle going is her cognitive distortions of catastrophizing, lead to release of adrenaline and fighter flight, a perception of dangerous which could be misinterpreted, that leads to behaviors such as avoidance and we can intervene any of these points.  Encourage patient with her relaxation strategies as one of the key interventions for anxiety.  Therapist provided strength based and supportive intervention.  Identify helpful coping for patient also includes her supports.  Plan: Return again in 2 weeks.2.  Therapist work with patient on coping skills for anxiety, supportive interventions to help patient with her stressors, utilized session to help patient process feelings to help with coping  Diagnosis: Axis I: Major Depressive Disorder, recurrent, moderate (history of bipolar, Generalized Anxiety Disorder    Axis II: No diagnosis    Coolidge Breeze, LCSW 07/17/2019

## 2019-07-18 ENCOUNTER — Ambulatory Visit
Admission: RE | Admit: 2019-07-18 | Discharge: 2019-07-18 | Disposition: A | Discharge status: Home / Self Care | Payer: Medicare Other | Source: Ambulatory Visit | Attending: Family Medicine | Admitting: Family Medicine

## 2019-07-18 ENCOUNTER — Other Ambulatory Visit: Payer: Self-pay

## 2019-07-18 DIAGNOSIS — Z1231 Encounter for screening mammogram for malignant neoplasm of breast: Secondary | ICD-10-CM

## 2019-07-18 DIAGNOSIS — Z23 Encounter for immunization: Secondary | ICD-10-CM | POA: Diagnosis not present

## 2019-08-01 ENCOUNTER — Ambulatory Visit (INDEPENDENT_AMBULATORY_CARE_PROVIDER_SITE_OTHER): Payer: Medicare Other | Admitting: Licensed Clinical Social Worker

## 2019-08-01 DIAGNOSIS — F411 Generalized anxiety disorder: Secondary | ICD-10-CM

## 2019-08-01 DIAGNOSIS — F331 Major depressive disorder, recurrent, moderate: Secondary | ICD-10-CM | POA: Diagnosis not present

## 2019-08-01 NOTE — Progress Notes (Signed)
Virtual Visit via Video Note  I connected with Diane Pacheco on 08/01/19 at 10:00 AM EDT by a video enabled telemedicine application and verified that I am speaking with the correct person using two identifiers.   I discussed the limitations of evaluation and management by telemedicine and the availability of in person appointments. The patient expressed understanding and agreed to proceed.   I discussed the assessment and treatment plan with the patient. The patient was provided an opportunity to ask questions and all were answered. The patient agreed with the plan and demonstrated an understanding of the instructions.   The patient was advised to call back or seek an in-person evaluation if the symptoms worsen or if the condition fails to improve as anticipated.  I provided 53 minutes of non-face-to-face time during this encounter.  THERAPIST PROGRESS NOTE  Session Time: 10:00 AM to 10:53 AM  Participation Level: Active  Behavioral Response: CasualAlertDysphoric and tearful at times in session also occasions where smiled euthymic  Type of Therapy: Individual Therapy  Treatment Goals addressed: stress management, supportive interventions to help patient be caretaker for many different people and family, decrease anxiety  Interventions: Solution Focused, Strength-based, Supportive, Meditation: mindfulness, Reframing and Other: coping  Summary: Diane Pacheco is a 49 y.o. female who presents with stressors as the same and severe. Husband currently in bed stays in bed it seems for hours because of his pain in the back. Doesn't leave the house unless doctor's appointments. Bent over and sweats profusely when walks because of pain, feet swell because of heart problems, on Lasix. 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 The longest been together since mom moved in and that was three hours and it has been three months since did something  together. Since mom moved in it has been particularly hard for him because he had her all to herself. By the time he feels good she is worn out. He drinks beer to help with pain. She has been up since 9 and not sat down all day. Hard because want to play wiht him but wore to bone. Doesn't feel like she has control over a lot. Does drink with husband but also will drink by herself and smiles saying that she doesn't need to but only thing she has control over. It does make her feel good to have control over that. Will drink her beer for example when doing the dishes. Provided background somebody has always taken care of her because gets confused. Dad has always helped her. Dad is ready to retire. Mom pays and the money goes to bills. Does have money in pocket for basics but beyond that  anything she wants to ask for what she needs. Just started to pay own rent. Knows he will always will help, then went on to say "should be able to take care of herself." therapist challenged her on should statement as giving ourselves a high standard that we can't live up to that leaves [place] feeling bad and helped patient challenge that. (see below) Discussed financial stressors. Has strategy to unwind and review a main one is that helps to feel something in her control. Shares that caretaker is something has to do because no one else would, been that way her whole life. Likes to do it, gets so tired. Not that wants to have more freedom in her life but that it is exhausting. As soon as she wakes [place] people are asking her  for something. Realizes the meaningfulness she does that keeping people alive. Reviewed her movie collection and therapist highlighted this as a means to cope. . Discussed difficulty of coping with so much to do and exhausted. Shared that her husband will do nice things for her and that helps. Especially thoughtful at times and gives an example when she was grateful he bought dinner and saved her from doing the dishes.  These things helps but day to day exhausting. Talked about a little break and sister will take mom over night. Having his bestfriend come over and enjoy the time together.  At end of session therapist introduced mindfulness activity (see below).       Suicidal/Homicidal: No  Therapist Response: Therapist reviewed symptoms, appears assesses significant amount of responsibilities patient has as a caretaker, limited options for coping so explored ones that she can use, when she does use for coping.  Identified patient has limited control over events in her life so finding a way to control something in her life helps her, also identified family that is supportive of her as helpful, financially, looking out for her needs although limited does what they can, opportunities although limited to spend time with her significant other.  Utilize reframing to point out meaningfulness of what patient does and how that will be lasting for patient even though challenging right now.  Assess facilitating expression of thoughts and feelings is very therapeutic for patient, able to express and release feelings and able to see in session emotional relief patient gets from doing this. Introduced mindfulness activity as coping strategy as well as a Optometrist.  Activity as a way for patient to check in with herself in the morning, with her body as checking in with your body helps get in touch with how she is feeling, checking in with her mood, paying attention to her mood in the morning can change how she acts throughout the day.  Therapist provided strength based and supportive intervention.  Challenged patient on cognitive distortion using should in terms of how she should be as a daughter, utilizing reframing to recognize normalcy of parents being there for support.  Plan: Return again in 3 weeks.2.Therapist work with patient on coping skills for anxiety, supportive interventions to help patient with her stressors, utilized  session to help patient process feelings to help with coping  Diagnosis: Axis I:  Major Depressive Disorder, recurrent, moderate (history of bipolar, Generalized Anxiety Disorder    Axis II: No diagnosis    Cordella Register, LCSW 08/01/2019

## 2019-08-09 DIAGNOSIS — Z23 Encounter for immunization: Secondary | ICD-10-CM | POA: Diagnosis not present

## 2019-08-23 ENCOUNTER — Ambulatory Visit (INDEPENDENT_AMBULATORY_CARE_PROVIDER_SITE_OTHER): Payer: Medicare Other | Admitting: Licensed Clinical Social Worker

## 2019-08-23 DIAGNOSIS — F411 Generalized anxiety disorder: Secondary | ICD-10-CM

## 2019-08-23 DIAGNOSIS — F331 Major depressive disorder, recurrent, moderate: Secondary | ICD-10-CM

## 2019-08-23 NOTE — Progress Notes (Signed)
Virtual Visit via Telephone Note  I connected with Theola Sequin on 08/23/19 at  9:00 AM EDT by telephone and verified that I am speaking with the correct person using two identifiers.   I discussed the limitations, risks, security and privacy concerns of performing an evaluation and management service by telephone and the availability of in person appointments. I also discussed with the patient that there may be a patient responsible charge related to this service. The patient expressed understanding and agreed to proceed.   I discussed the assessment and treatment plan with the patient. The patient was provided an opportunity to ask questions and all were answered. The patient agreed with the plan and demonstrated an understanding of the instructions.   The patient was advised to call back or seek an in-person evaluation if the symptoms worsen or if the condition fails to improve as anticipated.  I provided 53 minutes of non-face-to-face time during this encounter.  THERAPIST PROGRESS NOTE  Session Time: 9:00 AM to 9:53 AM  Participation Level: Active  Behavioral Response: CasualAlertAnxious and Dysphoric  Type of Therapy: Individual Therapy  Treatment Goals addressed: stress management, supportive interventions to help patient be caretaker for many different people and family, decrease anxiety.  Interventions: Solution Focused, Strength-based, Supportive, Reframing and Other: coping,   Summary: DUSTY WAGONER is a 49 y.o. female who presents with celebrated husband's birthday and had a good time. Mom was away for two days and couldn't wait to come back. What gets her is she can roll herself to play room to roll a cigarette, but can't get the kitchen to get a popsicle Husband too will call her from across the room to get a tissue.  Therapist pointed out they seem to be needy and patient said they are.  Patient shared however that it is hard for husband because of back, hard for  him to get going or walk in the morning, has DDD, injury to his back.  Shared very stressful incident that happened on top of caregiver stressors where paternal grandmother went to [place] left [place], husband's brother, and Heide Guile her niece.  Braelyn called and said Tommi Rumps not making any sense. Patient related he was going through DT's, sodium jacked up.  Patient had to go get her and was 45 minutes away she was scared to death and on her own. When she go there she had to sit on him, he had hallucinations. Shares that his . Diverticulitis flared so stopped drinking and he is still in the hopsital. Monday took Mom to hospital and Tuesday husband to doctor and Braelyn had to watch mom, they had to watch each other. Explored how she is coping. Not too bad. Learned that mom is appropriate for hospice for COPD. fourth stage. She doesn't want to take it. Wants to see her doctors.  Patient pointed out there is nothing more doctors can do that hospice can do. Having hospice would give her so much help. An Aid everyday and nurse right there. Palliative care nurse says if an escalation patient on her own won't know what to do. Wouldn't be as safe as having someone there to help. Patient shares focus with mom's illness now is trying to keep her comfortable and happy. Patient has a plan which is to have the cardiologist say she needs hospice.  Therapist provided her perspective that it will get better with hospice and patient relates "We will get through it."  Therapist explored self-care activities with patient as regular  in her sessions and patient does agree that therapy is me time. Patient able to express her frustrations. Looking forward to beach trip September has a time share. Describes mood as anxious, getting everything done keeping everyone happy, dishes, shop, cook, mow the grass, clean house do the laundry. Literally everything.Therapist pointed out also dealing with people who need extra care adds to the demands.  Patient vented more frustrations that it takes 3 hours to take her pills. Drags it out. Lucky to get her morning pills by 2 PM. Patient shares more self-care activities while they are watching television patient is cooking and doing dishes and drinking her beer. Listening to the morning show at night. In looking for positives patient shares son is graduating from high school. The last one. Proud of him. Doesn't like crowds so doesn't want to go. Started tearing in session. Will throw a party. Doesn't go to band concerts or any of kids stuff.  Reviewed working on this in sessions patient says "the way it is doesn't need to work on it, not missing out on anything."  Therapist pointed out positive of patient making party and he will really appreciate that. Reviewed session and patient relates really feel better, lighter.   Suicidal/Homicidal: No  Therapist Response: Therapist reviewed symptoms, facilitated expression of thoughts and feelings as an intervention to help patient working through her feelings to deal with significant stressors.  Utilize reframing to point out positive direction patient is going of working to get hospice involved that will give her some help, also encouraged her to talk about next positive things she has to look forward to.  Validated patient on how she was feeling, reinforced patient's insight and positive attitude that she will get through it pointing out persistence is a strength, source of resilience ineffective that we do get through things through persistence.  Shared therapist perspective of helpful to make lemonade out of lemons that led to patient's positive news of her son graduating pointed this out as something that has a positive effect on patient as it is her son and something she is proud of.  Courage patient to discuss frustrations as this provides an outlet for her which she does not have any place else.  Provided strength based and supportive intervention.  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  Plan: Return again in 3 weeks.Therapist work with patient on coping skills for anxiety, supportive interventions to help patient with her stressors, utilized session to help patient process feelings to help with coping  Diagnosis: Axis I: Major Depressive Disorder, recurrent, moderate (history of bipolar, Generalized Anxiety Disorder    Axis II: No diagnosis    Coolidge Breeze, LCSW 08/23/2019

## 2019-09-13 ENCOUNTER — Other Ambulatory Visit: Payer: Self-pay

## 2019-09-13 ENCOUNTER — Ambulatory Visit (INDEPENDENT_AMBULATORY_CARE_PROVIDER_SITE_OTHER): Payer: Medicare Other | Admitting: Licensed Clinical Social Worker

## 2019-09-13 DIAGNOSIS — F331 Major depressive disorder, recurrent, moderate: Secondary | ICD-10-CM | POA: Diagnosis not present

## 2019-09-13 DIAGNOSIS — F411 Generalized anxiety disorder: Secondary | ICD-10-CM | POA: Diagnosis not present

## 2019-09-13 NOTE — Progress Notes (Signed)
Virtual Visit via Video Note  I connected with Diane Pacheco on 09/13/19 at 10:00 AM EDT by a video enabled telemedicine application and verified that I am speaking with the correct person using two identifiers.   I discussed the limitations of evaluation and management by telemedicine and the availability of in person appointments. The patient expressed understanding and agreed to proceed.  I discussed the assessment and treatment plan with the patient. The patient was provided an opportunity to ask questions and all were answered. The patient agreed with the plan and demonstrated an understanding of the instructions.   The patient was advised to call back or seek an in-person evaluation if the symptoms worsen or if the condition fails to improve as anticipated.  I provided 53 minutes of non-face-to-face time during this encounter.   THERAPIST PROGRESS NOTE  Session Time: 10:00 AM to 10:53 AM  Participation Level: Active  Behavioral Response: CasualAlertAnxious and Euthymic  Type of Therapy: Individual Therapy  Treatment Goals addressed: stress management, supportive interventions to help patient be caretaker for many different people and family, decrease anxiety.  Interventions: Solution Focused, Strength-based, Supportive, Reframing and Other: coping, grief  Summary: Diane Pacheco is a 49 y.o. female who presents with son is graduating tomorrow. Throwing part on Saturday hectic to do that and also do what she normally does. Got smart and got daughter going to help. Proud of her son. He is already taking college classes. He wants to go into EMT paramedic and then on to be a doctor. Has a three other kids graduate and something to be proud of. Oldest ready to get married in October. Marrying a Marine scientist. Lived with patient a long-time helped them to save money. Daughter, Gwendolyn Fill, is like patient. Very nervous, high anxiety, reading disability. Therapist pointed out the caretaker role  as very demanding. Patient said one thing she has done her whole life something she knows how to do took care of grandfather and father. In terms of symptoms patient says doing alright and getting through it. Husband had a fall the other week. Messed up his back. Problem with his pain medication. Discussed brother-in-law severe medical issues that came out of nowhere, does have history of addiction. He is like a brother to patient, "the brother you want to punch". Convinced Mom when went to cardiologist not going to have colonoscopy with physical condition she is in. Still not willing to have respite care. Patient feels whatever she wants. Patient realizes that it is her control issues. Mom has COPD emphysema stage IV. discussed part of the way for patient to cope is finding positive experiences even if small. Patient shared she mowed the   lawn and it was so relaxing. Put on head phones and listening to music no one around and instant gratification. Instant gratification is was because it was messy and not nice and then clean. Discussed that instant gratification is a thing for her. Therapist shared impressed that mowing the lawn in the middle of the day is something she found positive. Discussed aging and how can handle a lot more now. Before one problem and have a melt down. Therapist pointed out positive experience for patient to look forward to with son's party. She shares they didn't have Christmas and Thanksgiving first time family get together.        Suicidal/Homicidal: No  Therapist Response: Therapist reviewed symptoms, facilitated expression of thoughts and feelings as patient manage stress of being a caretaker, utilize strength-based intervention and  pointing out even though patient has had problems with high anxiety she is undertaken stressful responsibility.  Identify patient has been a caretaker all her life is helpful.  Provided positive feedback for patient finding best way to support people  who she is taking care of such as mom and allowing her to have the care she wants as she gets closer to the end of her life.  Validated patient on difficulty role caretaker taking on the demands, discussed as well having to start grief process at the same time.  Processed patient's feelings to bring grief issues to the surface to help her address this issue.  Reframed by focusing on positive occurrences admits the stressors of patient hosting family, first get together in a long time and son graduating and has promising career ahead of him.  Reviewed helpfulness of aging in terms of coping, gaining perspective through experiences of life that is invaluable in helping to cope.  Assess patient able to access her experience to help her with stressors and symptoms..  Also assess patient taking on a very purposeful and meaningful role in taking care of family members is helpful for her wellbeing and stressors.  Reviewed importance of finding ways for patient to have positive experiences a months to stress and identify mowing the lawn as one such experience.  Therapist provided supportive intervention  Plan: Return again in 2 weeks.2.Therapist work with patient on coping skills for anxiety, supportive interventions to help patient with her stressors, utilized session to help patient process feelings to help with coping  Diagnosis: Axis I:  Major Depressive Disorder, recurrent, moderate (history of bipolar) Generalized Anxiety Disorder    Axis II: No diagnosis    Coolidge Breeze, LCSW 09/13/2019

## 2019-09-27 ENCOUNTER — Ambulatory Visit (INDEPENDENT_AMBULATORY_CARE_PROVIDER_SITE_OTHER): Payer: Medicare Other | Admitting: Licensed Clinical Social Worker

## 2019-09-27 DIAGNOSIS — F411 Generalized anxiety disorder: Secondary | ICD-10-CM

## 2019-09-27 DIAGNOSIS — F331 Major depressive disorder, recurrent, moderate: Secondary | ICD-10-CM | POA: Diagnosis not present

## 2019-09-27 NOTE — Progress Notes (Addendum)
Virtual Visit via Video Note  Therapist-home, patient-home  I connected with Theola Sequin on 09/27/19 at  9:00 AM EDT by a video enabled telemedicine application and verified that I am speaking with the correct person using two identifiers.   I discussed the limitations of evaluation and management by telemedicine and the availability of in person appointments. The patient expressed understanding and agreed to proceed.   I discussed the assessment and treatment plan with the patient. The patient was provided an opportunity to ask questions and all were answered. The patient agreed with the plan and demonstrated an understanding of the instructions.   The patient was advised to call back or seek an in-person evaluation if the symptoms worsen or if the condition fails to improve as anticipated.  I provided 55 minutes of non-face-to-face time during this encounter.  THERAPIST PROGRESS NOTE  Session Time: 10:00 AM to 10:55 AM  Participation Level: Active  Behavioral Response: CasualAlertAnxious and Dysphoric, tearful at times in session  Type of Therapy: Individual Therapy  Treatment Goals addressed: stress management, supportive interventions to help patient be caretaker for many different people and family, decrease anxiety.  Interventions: Solution Focused, Strength-based, Supportive and Other: stress management, coping  Summary: BELLAMIE TURNEY is a 49 y.o. female who presents with party went great. Sister and husband didn't come of course. "They are fickle." It was for son who is going to go to EMT with plan of being a doctor. Lot of things popping up that has increased anxiety and stress. Daughter had to get wisdom teeth out and not good at organizing things so patient had to set things up and was with her for hours for the procedure. She is 22, graduated has reading disability. Patient had to take her dog to the doctor. Skin problems. Costly to go to doctor but took care of  the problem. It was scabbing and blooding. He is patient's little baby Garald Balding. This was on top of doing regular care taking tasks. Handled that well. Tried to speak to her sister always a mistake to talk to her. Asked her to watch Mom when went on vacation and gave her four months notice to figure something out. She can't do it and made her so mad. Was only asking for a little over a week when otherwise patient watches her all the time. Gets her upset and puts her down. Says not doing this right and patient asks herself that she is directing her but what is she doing? Basically nothing. Blocked her because makes her so mad. Even Mom can't tell her she feels bad. Giving directions to them but doing nothing for her Mom's care. Older  son has thick skin and deals with her. Her oldest son. He is 59 turn 55 getting married to the nurse. Just started setting boundaries with sister since a few months ago since taking care of Mom. Didn't have as much interaction with her before. Brother is a her landlord. Realtor and runs a Secondary school teacher group with mother-in-law. Discussed people treating each other respectfully and kindly and for her it is a sense of community. Doesn't see it as much. Notices her fuse a little shorter later. Mixture of all the above of anxiety, depression, feels burned out. Demands on her too much especially with no help. Going for iron transfusions with mom and can't do that with hospice.(They could have helped patient in some way) Mom still wanting to get better. Did talk her out of colonoscopy. Reviewed  list of things she has to do cook, clean, laundry, grocery, baths, yard work, take care of [name] (husband), dealing with him feeling she is not giving him attention. Just got a new job to feed mother-in-law's dog while on vacation. Patient became tearful again talking about all the things she has to do that are overwhelming. She is in situation where nobody is helping take over any responsibilities.  Explored what she can do for herself. Does get to a place where fuse goes off after a long day when drinking a beer and somebody says the wrong thing. Husband likes to joke at patient's expense. Just him and the way he interacts. Doesn't mean it. When had enough will say something. Shares that she will have stress when she takes one of them to doctor, worry about the other at home. Constant stress going on. Can't relax when you are worried. Patient has problems finding time to take a shower and bath. Ever since little a struggle. Also doesn't have the time. Does play her computer games.  Discussed more relaxation strategies as helpful and will explore and go over other stress management activities     Suicidal/Homicidal: No  Therapist Response: Therapist reviewed symptoms, facilitated expression of thoughts and feelings as a treatment intervention to process feelings to help with stressors, anxiety, feeling overwhelmed.  Therapist validated patient on how she was feeling.  Provided positive feedback and recognize the need to set boundaries in her family who are critical and non supportive particularly stressful as patient takes on role of caretaker without any help.  Put words to patient's feelings while she was tearful and related different ways different things that happen can feel like "the straw that broke the camel's back" putting words to feelings provide meeting and help with coping.  Continue to find ways for patient to engage in self-care explored possible relaxation strategies and identified once patient finds helpful such as playing her games.  Encouraged her in setting more boundaries to help with her stressors including with mother-in-law, talking to her husband about needing support particularly when patient is overwhelmed with stressors.  Provided education on anger that it is not necessarily a negative affect patient blowing off steam can release emotion and anger as a way of standing up for  yourself when you feel being treated unfairly so not necessarily negative when she needs to release anger.  It appears to help her set boundaries.  Therapist will continue to explore with patient possible stress management strategies.  Will review the anxiety and phobia workbook to look for other options.  We will discussed resetting her system with relaxation strategies.  Therapist provided strength based and supportive intervention  Plan: Return again in 2 weeks.2.Therapist work with patient on coping skills for anxiety, supportive interventions to help patient with her stressors, utilized session to help patient process feelings to help with coping.3.  Will review specific suggestions from anxiety and phobia workbook for stress management, explained to patient resetting her system through relaxation strategies  Diagnosis: Axis I:  Major Depressive Disorder, recurrent, moderate (history of bipolar) Generalized Anxiety Disorder    Axis II: No diagnosis    Coolidge Breeze, LCSW 09/27/2019

## 2019-10-11 ENCOUNTER — Ambulatory Visit (INDEPENDENT_AMBULATORY_CARE_PROVIDER_SITE_OTHER): Payer: Medicare Other | Admitting: Licensed Clinical Social Worker

## 2019-10-11 DIAGNOSIS — F331 Major depressive disorder, recurrent, moderate: Secondary | ICD-10-CM | POA: Diagnosis not present

## 2019-10-11 DIAGNOSIS — F411 Generalized anxiety disorder: Secondary | ICD-10-CM | POA: Diagnosis not present

## 2019-10-11 NOTE — Progress Notes (Addendum)
Virtual Visit via Video Note  Therapist-home office, patient-home I connected with Diane Pacheco on 10/11/19 at 10:00 AM EDT by a video enabled telemedicine application and verified that I am speaking with the correct person using two identifiers.   I discussed the limitations of evaluation and management by telemedicine and the availability of in person appointments. The patient expressed understanding and agreed to proceed.   I discussed the assessment and treatment plan with the patient. The patient was provided an opportunity to ask questions and all were answered. The patient agreed with the plan and demonstrated an understanding of the instructions.   The patient was advised to call back or seek an in-person evaluation if the symptoms worsen or if the condition fails to improve as anticipated.  I provided 53 minutes of non-face-to-face time during this encounter.  THERAPIST PROGRESS NOTE  Session Time: 10:00 AM to 10:53 AM  Participation Level: Active  Behavioral Response: CasualAlertAnxious and Depressed  Type of Therapy: Individual Therapy  Treatment Goals addressed: stress management, supportive interventions to help patient be caretaker for many different people and family, decrease anxiety.  Interventions: Solution Focused, Strength-based, Psychosocial Skills: coping and Other: coping  Summary: Diane Pacheco is a 49 y.o. female who presents with had a rough week last week. Mom had bad breathing days had her on ventilator a lot. Did dishes and she started screaming. She grabbed a hold of patient and crying. She had a bad dream that fell and nobody there and really messed her up. Told her never happen. Woke up next day couldn't breath part of it anxiety and also CO2 built up, disoriented had to call the ambulance. Talked to doctors and said not sure work on air ventilator will keep working and asked if they wanted to incubate. Thank goodness non invasive ventilator  worked. "About killed me". Son sat beside her all day at the hospital. Thank goodness C-PAP worked, can't keep doing it. Brought hospice and have a hospital bed and set up in a spare room, doing ok scared her really bad. Got together and decided not to  resuscitate  If on ventilator quality of life wouldn't be same, CPR very intrusive hard thing the way her heart is now wouldn't be worth it. Hospice called medical director to prescribe medication for anxiety in case gets panicky that and morphine and C-PAP, had respiratory therapist set up like they do in the hospital, so Co2 doesn't build up. And then panic of not breathing, heart rate goes up, breathing gets short and shallow have to relax get the machine to do what needed. Got the baby monitor and she loves her. Aids coming in twice a week to give baths, nurse come in once a week.  Discussed is a mixed bag, difficulty though of mom's disease progression patient agrees that it's coming and it's very hard. Appreciate the help, has a hotline call somebody and have somebody come right away. Nice she has more regular baths. Nice to get a little help, a little bit. Related that with cancer they can nail it down. With COPD no time table and that is hard part not knowing. Mom brags about patient with patient being a caretaker. Mom English and what that brought to her life. Gets in her favorite food, movie night every night.  Discussed patient sitting down and relaxing and she relates she is always on the move hard to sit there and relax. Drives her insane. Fredric Dine is going to spend the night  and patient and mom excited. Patient's little helper. She is 49 years old. Will probably play pool and dance. Mowed grass and neighbor grass. Patient listens to music good experience. Bear trying to help. His mom is going out of town and would have to watch the dogs would add extra responsibilities and doesn't know how to handle it. Bear will stay out with dogs so just have to check on  him. Worries about him being alone. He doesn't need everyday care with back and doesn't have to walk as far at his mom's house. Sister is coming about 16 AM (also has a younger brother) to see Mom.  Is the sister that is difficult but can tolerate her coming to see mom            Suicidal/Homicidal: No  Therapist Response: Therapist reviewed symptoms, facilitated expression of thoughts and feelings, particularly around new developments therapist difficult for patient with hospice coming into mom going to the hospital for 2 days.  Discussed and validated difficulty of mom's disease progressing, then certainly when she may lose her, discussed a good idea for patient to do things such as think about her favorite foods, think she would enjoy as she will really appreciated, also significance of patient taking care of her when she is really sick is very much appreciated in general when people take a caretaker role when sick.  Discussed it is a mixed bag at the same time tough to see hospice coming but it is providing some relief, what her mom needs now in terms of backup, more immediate help for her needs.  Related some of the positive things for her family such as moving night, Bralon coming to stay with him, helps when dealing with all the difficulties.  Discussed in general held dance and music release endorphins help with enhancing mood.  Discussed helpful for mental health as well to help people as it helps them but also has a positive effect on her own mental health. Therapist provided active listening, open questions supportive and strength-based intervention.  Plan: Return again in 2 weeks.2.2.Therapist work with patient on coping skills for anxiety, supportive interventions to help patient with her stressors, utilized session to help patient process feelings to help with coping.3.  Will review specific suggestions from anxiety and phobia workbook for stress management,   Diagnosis: Axis I: Major  Depressive Disorder, recurrent, moderate (history of bipolar) Generalized Anxiety Disorder    Axis II: No diagnosis    Coolidge Breeze, LCSW 10/11/2019

## 2019-10-25 ENCOUNTER — Ambulatory Visit (INDEPENDENT_AMBULATORY_CARE_PROVIDER_SITE_OTHER): Payer: Medicare Other | Admitting: Licensed Clinical Social Worker

## 2019-10-25 DIAGNOSIS — F411 Generalized anxiety disorder: Secondary | ICD-10-CM | POA: Diagnosis not present

## 2019-10-25 DIAGNOSIS — F331 Major depressive disorder, recurrent, moderate: Secondary | ICD-10-CM

## 2019-10-25 NOTE — Progress Notes (Signed)
Virtual Visit via Video Note  Therapist-office Patient-home I connected with Diane Pacheco on 10/25/19 at 10:00 AM EDT by a video enabled telemedicine application and verified that I am speaking with the correct person using two identifiers.   I discussed the limitations of evaluation and management by telemedicine and the availability of in person appointments. The patient expressed understanding and agreed to proceed.   I discussed the assessment and treatment plan with the patient. The patient was provided an opportunity to ask questions and all were answered. The patient agreed with the plan and demonstrated an understanding of the instructions.   The patient was advised to call back or seek an in-person evaluation if the symptoms worsen or if the condition fails to improve as anticipated.  I provided 50 minutes of non-face-to-face time during this encounter.  THERAPIST PROGRESS NOTE  Session Time: 10:00 AM to 10:50 AM  Participation Level: Active  Behavioral Response: CasualAlertAnxious and Depressed  Type of Therapy: Individual Therapy  Treatment Goals addressed:  stress management, supportive interventions to help patient be caretaker for many different people and family, decrease anxiety. Interventions: Solution Focused, Strength-based, Supportive and Other: coping, grief  Summary: Diane Pacheco is a 49 y.o. female who presents with a lot of people coming by with family and nurses. Mom went to hospital again because CO2 builds up and almost like she is drunk, disorientating, hallucinating. Scared her because she has a different baseline and it has dropped. Normal has dropped 20 % how she is day to day, worse everyday than used to be. Worrying her every day every hour. Last night finally got to bed at 2 and then she got up at 4, 6 and 8. Took off bi-pap machine and don't know how long without breathing scares her to death. Hospice giving her things to be "doped up".  Giving her anxiety medication too strong, hospital gave her something smaller, mom doesn't want to be out of it. Will do what she can to keep her here even if a few more days. Able to watch show last night and mom enjoyed it. Patient shares "I feel I am in this by myself." Doing everything she can to keep her alive and it is just patient. Nauseous all the time. Schedule tests for her husband, schedule her stuff, laundry, house, taking care of Bear. The emotional part would be hard enough. Worry every second away at the grocery store. Watching her breathing. Recognizes crisis mode and waiting for the other shoe to drop. Nothing helping to sooth. Wear herself out, then pass out and do it again. Not getting any sleep. Nobody to step in to watch the monitor. Mom came home from the hospital. Patient shares she did do smart thing was going to see her and getting stressed, talked to clergy and cried it out. Smart enough to cry and vent.  Patient says hospital almost gives her a break therapist says admitted brakes and she is doing the work of all hospital team.  Showed mom pictures she hasn't seen in a long time ago. Cooked good food for her. Two hour break when took care of his mom's dogs. If can get over nauseous and not having a knot all the time. Daughter calls her valium therapist explored how she can do that for herself. Reviews session and feels better crying.    Suicidal/Homicidal: No  Therapist Response: Therapist reviewed symptoms, facilitated expression of thoughts and feelings helping patient to vent her feelings to help her  cope with grief of losing her mom as mom is getting sicker, assess patient's stress level increasing.  Reviewed patient is in fighter flight mood and things helpful to deactivate this extreme anxiety.  Reviewed taking breast positive self talk talking to her and ways that she would talk to her best friend such as tying herself she is good to her mom, both are lucky to have each other.   Encourage patient in activities to create memories to make the most of the time together which the patient which is as patient is doing, reviewed helpful to challenge her emotions this way, helpful with grief also helpful afterwards with grief process.  Continue to highlight significant role patient plays in family life, to her mother who is dying and considers her her security as a supportive intervention by therapist as well as strength based.  Encourage effective coping by making the most of these moments.  Validating patient how she is feeling.  Identify patient keeping busy is the only way she can manage the stressors discussed being in crisis mode which means keeping focused on the next task needs done to get a crisis.  Worked on raising insight to help with patient stress realizing that she has to lack of control accepting reality of limited control helps in letting to and acceptance. Discussed The Serenity Prayer.   Plan: Return again in 2 weeks.2.Therapist work with patient on coping skills for anxiety, supportive interventions to help patient with her stressors, utilized session to help patient process feelings to help with coping  Diagnosis: Axis I: Major Depressive Disorder, recurrent, moderate (history of bipolar) Generalized Anxiety Disorder   Axis II: No diagnosis    Coolidge Breeze, LCSW 10/25/2019

## 2019-11-06 ENCOUNTER — Ambulatory Visit (HOSPITAL_COMMUNITY): Payer: Medicare Other | Admitting: Licensed Clinical Social Worker

## 2019-11-22 ENCOUNTER — Ambulatory Visit (INDEPENDENT_AMBULATORY_CARE_PROVIDER_SITE_OTHER): Payer: Medicare Other | Admitting: Licensed Clinical Social Worker

## 2019-11-22 DIAGNOSIS — F331 Major depressive disorder, recurrent, moderate: Secondary | ICD-10-CM

## 2019-11-22 DIAGNOSIS — F411 Generalized anxiety disorder: Secondary | ICD-10-CM

## 2019-11-22 NOTE — Progress Notes (Signed)
Virtual Visit via Video Note  Therapist-office Patient-patient's car I connected with Diane Pacheco on 11/22/19 at  8:00 AM EDT by a video enabled telemedicine application and verified that I am speaking with the correct person using two identifiers.   I discussed the limitations of evaluation and management by telemedicine and the availability of in person appointments. The patient expressed understanding and agreed to proceed.   I discussed the assessment and treatment plan with the patient. The patient was provided an opportunity to ask questions and all were answered. The patient agreed with the plan and demonstrated an understanding of the instructions.   The patient was advised to call back or seek an in-person evaluation if the symptoms worsen or if the condition fails to improve as anticipated.  I provided 52 minutes of non-face-to-face time during this encounter.  THERAPIST PROGRESS NOTE  Session Time: 8:00 AM to 8:52 AM  Participation Level: Active  Behavioral Response: CasualAlertAnxious and Depressed  Type of Therapy: Individual Therapy  Treatment Goals addressed:  stress management, supportive interventions to help patient be caretaker for many different people and family, decrease anxiety.  Interventions: Solution Focused, Strength-based, Supportive and Other: grief, coping  Summary: Diane Pacheco is a 49 y.o. female who presents with only getting at night 3-4 hours of sleep. Scared when mom's health symptoms. For example, mom having chest pain. Frustrating with how hospital handling her and making her wait. So grateful the ambulance driver who was willing for mom to stay in ambulance until she was seen. Stayed for 5-6 days at hospital. Doesn't think she was coming out that time. Patient shares she is so tired. Adds more to patient because she is stressed all the time worried about her. Did get her a O2 pulse, on her phone shows her oxygen for past 10 hours. "Has  been a life saver" Doesn't have to wake her up. "Nothing to do but waiting game. Nothing going to fix it always there. It is not temporary always there." Hate to say has to take her on vacation and she is so excited to go.  Patient tearful in explaining this and shares all she wants is a break. Was looking forward to vacation but not so much anymore. Nobody else will take her and if don't take will have to cancel. Very frustrated nobody step up to help her. Irritated, wore out drinking and smoking more. Make it so doesn't care so much. Discussed her outlet also is the grocery store. The only place she gets to go. Step dad collapsed lung. Can't remember anything. On the back burner, he goes to his friend. Did manage to get power of attorney so don't have to take her to the bank so that is a little bit less. Mom tries to make things easier for patient. CO2 what worry about think got ventilator figured out. 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  Therapist encourage patient to ask for help and patient shares asking feels the rejection would make it worse. Doesn't want to take the chance of asking, afraid of how she would react. Easier not to ask. Diane Pacheco is worried about her. Demanded more cuddle time. Too exhausted to enjoy any relaxation outlets. Doesn't do pills so strung up on different pills when in the hospital so can't do it. Smokes marijuana everyday and so does mom. Baby joints.  Therapist understands difficulty patient is having in crisis and grief process happening even his mom is still alive.  Says working  on grief issues in session as with mom medical issues she is losing her even though she is still with patient.       Suicidal/Homicidal: No  Therapist Response: Therapist reviewed symptoms, facilitated expression of thoughts and feelings focused on strength-based intervention patient now taking care of mom and doing more she can will be very meaningful for her as time goes by that she was there to  take care of her and was the go to person for her mom.  Validated patient on how she was feeling and and reasonableness of other people not coming to her aid yet again a significant role patient is fine now on helping her mom.  Discussed patient being in constant fighter flight mood to better understand her symptoms, patient being in crisis mode discussed using strategies that will help her get through it as what people do when they are in survival mode.  Therapist let patient guide her in looking for some positives including finding a machine to help her take care of her mom, ambulance worker who was very helpful, patient is outside of going to the store therapist utilized in general active listening open questions supportive interventions  Plan: Return again in 3 weeks.2.Therapist work with patient on coping skills for anxiety, supportive interventions to help patient with her stressors, utilized session to help patient process feelings to help with coping  Diagnosis: Axis I:  Major Depressive Disorder, recurrent, moderate (history of bipolar) Generalized Anxiety Disorder   Axis II: No diagnosis    Coolidge Breeze, LCSW 11/22/2019

## 2019-12-10 ENCOUNTER — Ambulatory Visit (INDEPENDENT_AMBULATORY_CARE_PROVIDER_SITE_OTHER): Payer: Medicare Other | Admitting: Family Medicine

## 2019-12-10 ENCOUNTER — Other Ambulatory Visit: Payer: Self-pay

## 2019-12-10 VITALS — BP 124/70 | HR 82 | Ht 65.0 in | Wt 170.0 lb

## 2019-12-10 DIAGNOSIS — M546 Pain in thoracic spine: Secondary | ICD-10-CM

## 2019-12-10 DIAGNOSIS — T148XXA Other injury of unspecified body region, initial encounter: Secondary | ICD-10-CM | POA: Insufficient documentation

## 2019-12-10 LAB — POCT URINALYSIS DIP (MANUAL ENTRY)
Bilirubin, UA: NEGATIVE
Glucose, UA: NEGATIVE mg/dL
Ketones, POC UA: NEGATIVE mg/dL
Leukocytes, UA: NEGATIVE
Nitrite, UA: NEGATIVE
Protein Ur, POC: NEGATIVE mg/dL
Spec Grav, UA: 1.02 (ref 1.010–1.025)
Urobilinogen, UA: 0.2 E.U./dL
pH, UA: 6 (ref 5.0–8.0)

## 2019-12-10 LAB — POCT UA - MICROSCOPIC ONLY

## 2019-12-10 MED ORDER — CYCLOBENZAPRINE HCL 5 MG PO TABS: 5.0000 mg | ORAL_TABLET | Freq: Three times a day (TID) | ORAL | 0 refills | Status: DC | PRN

## 2019-12-10 NOTE — Assessment & Plan Note (Signed)
The differential includes intercostal muscle strain versus kidney stone versus pyelonephritis.  Low suspicion for pyelonephritis based on lack of any urinary symptoms and minimal CVA tenderness.  She seemed to be more sensitive to manipulation of the muscles around her ribs as opposed to percussion into the deeper tissue.  She has not previously suffered from kidney stones although this may be an initial presentation.  If discomfort persists, I would order a renal ultrasound. -NSAIDs and Tylenol for pain relief -Flexeril at night for muscle relaxant -Renal ultrasound if no improvement or worsening symptoms.

## 2019-12-10 NOTE — Progress Notes (Signed)
    SUBJECTIVE:   CHIEF COMPLAINT / HPI:   Muscle strain of right flank Diane Pacheco reports significant pain to her right flank for the past 4 days.  The pain is described as a dull throb that bothers her throughout the day and night.  She notices this most at night when she is trying to sleep and cannot get comfortable.  She does not recall any specific trauma.  She has not had an issue like this before.  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  As a result, she does a lot of heavy lifting and chores around the house.  She believes she may have simply strained something without noticing it.  Since she started having pain, she has tried heat and ice which has not made a significant difference, Aleve and a lidocaine patch.  None of these modalities have made a significant improvement in her discomfort.  She specifically denies fever, nausea, vomiting, stomach pain, dysuria, polyuria, urinary urgency.  She reports that she has never had kidney stones before but that her daughter has had kidney stones.  PERTINENT  PMH / PSH: Overweight  OBJECTIVE:   BP 124/70   Pulse 82   Ht 5\' 5"  (1.651 m)   Wt 170 lb (77.1 kg)   LMP 11/26/2019   SpO2 98%   BMI 28.29 kg/m    General: Seated on the exam table with significant discomfort with manipulation of the table and moving up and down. Upper back: No CVA tenderness on the left side.  Mild discomfort with percussion of the costovertebral angle on the right with some tenderness with palpation of ribs 8-11. Lower back: No tenderness with percussion of the thoracic or lumbar vertebrae.  No significant paraspinal tenderness in the thoracic or lumbar area.  SI joints and upper gluteal muscles nontender to palpation. Lower extremities: Straight leg raise negative bilaterally.  FABER and FADIR testing negative bilaterally. Spine: Able to stand and demonstrate good axial rotation to the  right to the left without discomfort.  Able to demonstrate good forward flexion and backward extension without discomfort.  ASSESSMENT/PLAN:   Muscle strain The differential includes intercostal muscle strain versus kidney stone versus pyelonephritis.  Low suspicion for pyelonephritis based on lack of any urinary symptoms and minimal CVA tenderness.  She seemed to be more sensitive to manipulation of the muscles around her ribs as opposed to percussion into the deeper tissue.  She has not previously suffered from kidney stones although this may be an initial presentation.  If discomfort persists, I would order a renal ultrasound. -NSAIDs and Tylenol for pain relief -Flexeril at night for muscle relaxant -Renal ultrasound if no improvement or worsening symptoms.     01/26/2020, MD [facility]

## 2019-12-10 NOTE — Patient Instructions (Signed)
Back pain: Good news, there does not seem to be any sign of infection in your urine.  At this point, I think we can treat this as a muscle sprain in your back.  I expect this to improve in the next 2 weeks.  If it does not show significant improvement in the next 2-3 weeks, come back to clinic.  For now, you can take ibuprofen 600 mg as often as every 6 hours.  You can also take Tylenol although I think ibuprofen would be the more helpful medication.  Can also try taking Flexeril at night to help relax the muscles.

## 2019-12-19 ENCOUNTER — Ambulatory Visit (INDEPENDENT_AMBULATORY_CARE_PROVIDER_SITE_OTHER): Payer: Medicare Other | Admitting: Licensed Clinical Social Worker

## 2019-12-19 DIAGNOSIS — F331 Major depressive disorder, recurrent, moderate: Secondary | ICD-10-CM

## 2019-12-19 DIAGNOSIS — F411 Generalized anxiety disorder: Secondary | ICD-10-CM

## 2019-12-19 NOTE — Progress Notes (Signed)
Virtual Visit via Video Note  Therapist-home office Patient-home I connected with Diane Pacheco on 12/19/19 at  8:00 AM EDT by a video enabled telemedicine application and verified that I am speaking with the correct person using two identifiers.   I discussed the limitations of evaluation and management by telemedicine and the availability of in person appointments. The patient expressed understanding and agreed to proceed.   I discussed the assessment and treatment plan with the patient. The patient was provided an opportunity to ask questions and all were answered. The patient agreed with the plan and demonstrated an understanding of the instructions.   The patient was advised to call back or seek an in-person evaluation if the symptoms worsen or if the condition fails to improve as anticipated.  I provided 53 minutes of non-face-to-face time during this encounter.  THERAPIST PROGRESS NOTE  Session Time: 8:00 AM to 8:53 AM  Participation Level: Active  Behavioral Response: CasualAlertAnxious and Euthymic  Type of Therapy: Individual Therapy  Treatment Goals addressed: stress management, supportive interventions to help patient be caretaker for many different people and family, decrease anxiety.  Interventions: Solution Focused, Strength-based, Supportive and Reframing  Summary: Diane Pacheco is a 49 y.o. female who presents with this Sunday leaving for the beach. Spoke up to mom and now sister watching her. She is worried about it. "It is like leaving a four month year old baby." Wrote an instructional manual on how to take care of her. Pulled muscle in back and could hardly manage for four days. Bear stepped up.  Therapist pointed out this shows her that when she is out of commission other people can step up for her.  She is making sure things in place to feel reassured when she leaves. Tiried of her going to hospital and dangerous with one thing of COVID. Came up with  Oxygen ring and suggested CO2 check every couple of weeks to help her not go in the hospital.  Discussed patient stepping up be on provider to come up with solutions showing how helpful she is with mom's care. Son coming to help her. Oldest son is her rock. She vents to him as well. When Mom in hospital he comes and sits with her in the car to talk to her and distract her. "He is my valium he calms me down." He will talk about his wedding and distract her. He is getting married on October 23. Patient and her mom are excited.  Reports pointed out nice she has other things to look forward to been talked about the wedding with patient            Suicidal/Homicidal: No  Therapist Response: Therapist reviewed symptoms, facilitated expression of thoughts and feelings, discussed patient going on vacation as making progress on treatment goals as vacation will help her rest, be restorative for her that will help with decreasing stress and anxiety, also help her be a better caretaker after she takes care of herself and gives herself rest.  Discussed worries she has leaving and highlighted she has preparations in place, that other adults are capable of taking care of her mom, that she cannot control everything anyway and has to have acceptance of that and looking at the big picture getting a break will be very beneficial for her.  Therapist pointed out was good patient spoke up there were no negative consequences of needing help encouraged her this is being a good strategy for her to get the help  she needs.  Assess patient's hesitancy and this is difficult for her.  Therapist utilized active listening open questions supportive interventions.  Identified key supports helpful for patient in coping such as her oldest son.  Plan: Return again in 2 weeks.2.Therapist work with patient on coping skills for anxiety, supportive interventions to help patient with her stressors, utilized session to help patient process feelings to  help with coping  Diagnosis: Axis I: Major Depressive Disorder, recurrent, moderate (history of bipolar) Generalized Anxiety Disorder    Axis II: No diagnosis    Coolidge Breeze, LCSW 12/19/2019

## 2020-01-03 ENCOUNTER — Ambulatory Visit (INDEPENDENT_AMBULATORY_CARE_PROVIDER_SITE_OTHER): Payer: Medicare Other | Admitting: Licensed Clinical Social Worker

## 2020-01-03 DIAGNOSIS — F411 Generalized anxiety disorder: Secondary | ICD-10-CM | POA: Diagnosis not present

## 2020-01-03 DIAGNOSIS — F331 Major depressive disorder, recurrent, moderate: Secondary | ICD-10-CM

## 2020-01-03 NOTE — Progress Notes (Signed)
Virtual Visit via Video Note  Therapist-home office Patient-car I connected with Diane Pacheco on 01/03/20 at 10:00 AM EDT by a video enabled telemedicine application and verified that I am speaking with the correct person using two identifiers.   I discussed the limitations of evaluation and management by telemedicine and the availability of in person appointments. The patient expressed understanding and agreed to proceed.  I discussed the assessment and treatment plan with the patient. The patient was provided an opportunity to ask questions and all were answered. The patient agreed with the plan and demonstrated an understanding of the instructions.   The patient was advised to call back or seek an in-person evaluation if the symptoms worsen or if the condition fails to improve as anticipated.  I provided 53 minutes of non-face-to-face time during this encounter.  THERAPIST PROGRESS NOTE  Session Time: 10:00 AM to 10:53 AM  Participation Level: Active  Behavioral Response: CasualAlertAnxious  Type of Therapy: Individual Therapy  Treatment Goals addressed: stress management, supportive interventions to help patient be caretaker for many different people and family, decrease anxiety. Interventions: Solution Focused, Strength-based, Supportive and Other: stress management, coping  Summary: Diane Pacheco is a 49 y.o. female who presents with had her vacation and it was wonderful. It was relaxing. Helps coming back to things recharged. Sister backed out three days before she was supposed to go. She was sick and hard time swallowing and didn't want to watch her when she was not feeling well. Her son managed get days off. He and his wife watched her and was even better since had a nurse half the time watching her. Therapist pointed out nice to see she has people in life come through for her. Patient said her son is her new hero. Mom loved spending time with oldest grandson. He now  knows the constant worry that she deals with everyday now. Therapist pointed out that he can have empathy now knowing the constant worry can offer even more support now. His bridal shower this Saturday. Therapist pointed out helps with coping to have things to look forward. Shared these things help to feel better about life. Patient will take mom for a few hours. Therapist pointed out that there are big celebrations for family and nice mom can be there. Daughter is one of the maid of honor, youngest son will be best man and will get dresses and tuks. Patient doesn't like dressing up going around a lot of people. She cries when goes shopping the stress, making decisions. Doesn't like the parties. Immediate family would be different. Mom is doing alright legs swelling. Patient is doing fine today. Both Diane Pacheco and Mom legs are swelling badly. Diane Pacheco has an echocardiogram to check out his heart always something to worry. Therapist pointed out she needs more vacation. Helps her to get her mind off of things. Especially with son and his fiancee there and sending screen shots of her readings. Stepfather pays her to take care of mom helps pay rent. Were going to move before mom moved in because she can't afford the rent. Stepfather's memory has gotten really bad. Cashed check and said forgery because mom wrote it. She has been writing for past eight years. "Bank went stupid on them." does love being able to stay where she lives currently. Has been there for 4 years. Dad always paid rent and bills for 20-30 years. It is just he is getting older and business slowing down. Still pays cable and light  and step dad pays for her rent. Doesn't know for rent to just talking care of mom. With check police got involved. Soon as explained he understood what was going on. Patient tries to check in on step dad who has really declined but doesn't have the time. Even taking Diane Pacheco for test requires arranging things. For anything it takes  planning. Even going to grocery store. Has to get Mom arranged every time he calls she worries something happen. Therapist pointed out she is on high alert all the time. After therapist appointment, hospice nurse coming, then Diane Pacheco's mom bringing back dog, car inspected, grocery store and then stuff around house. Never stops up to 3:30 AM. Hard to get stuff with both Diane Pacheco and mom asking her to do things. Better to put them down and then get stuff down. Reviewed stress coping has to use the deep breathing to handle anger. Reviewed down time. Tells them washing dishes sometimes and then goes plays a video game. Therapist said for her mental health good if she carve out time for herself. Watch TV standing up and antsy and hard to sit down and relax or until go to bed at night. Explored ways to do that even with simple things like being outside in nature, watching a good TV show is good for mental health. Patient loves driving in her car turning up the radio and singing and not caring what other people think. Discussed emotional strategies for stress include having supports. Patient said it is therapist. Used to talk to mom hard to talk to her about her. Talk to her about other things. Mom said she (mom) doesn't handle Diane Pacheco sometimes and that patient  has patience of an angel. When he gets to drinking he gets fussy. All the things she hasn't done. Can get a little testy. Therapist asked how he doesn't get that she does everything. He does but forgets when starts to drinking. Patient doesn't organize and spins he reminds her and keeps her organized during the say. The way he talks doesn't always come out so nice doesn't bother her because used to it and doesn't mean it personally. Hard for other people to hear him talk to her like that. They don't understand doesn't bother her. He is loud, hollers all the time. Not that hollering but that he is really loud. Therapist pointed out that when people live together people  aren't not going to get along. Patient says he doesn't want to share her because always been that way. Therapist said he has to change that life is about change. He is not about change and he is OCD. Very particular.   Therapist reviewed symptoms, facilitated expression of thoughts and feelings.  Worked on positive coping strategies for stress (see note).  Would include abdominal breathing, downtime, many breaks including vacations, simple things as being helpful explored her supports identified therapist's main support.  Patient verbalizing about her stressors assess as helpful for her coping working through her feelings.  Therapist provided active listening open questions supportive interventions.       Suicidal/Homicidal: No  Plan: Return again in 2 weeks.2.Therapist work with patient on coping, supportive interventions, facilitating expression of thoughts and feelings.   Diagnosis: Axis I: Major Depressive Disorder, recurrent, moderate (history of bipolar) Generalized Anxiety Disorder    Axis II: No diagnosis    Coolidge Breeze, LCSW 01/03/2020

## 2020-01-05 DIAGNOSIS — Z23 Encounter for immunization: Secondary | ICD-10-CM | POA: Diagnosis not present

## 2020-01-17 ENCOUNTER — Ambulatory Visit (INDEPENDENT_AMBULATORY_CARE_PROVIDER_SITE_OTHER): Payer: Medicare Other | Admitting: Licensed Clinical Social Worker

## 2020-01-17 DIAGNOSIS — F411 Generalized anxiety disorder: Secondary | ICD-10-CM

## 2020-01-17 DIAGNOSIS — F331 Major depressive disorder, recurrent, moderate: Secondary | ICD-10-CM
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
## 2020-01-23 ENCOUNTER — Ambulatory Visit (INDEPENDENT_AMBULATORY_CARE_PROVIDER_SITE_OTHER): Payer: Medicare Other | Admitting: Licensed Clinical Social Worker

## 2020-01-23 DIAGNOSIS — F411 Generalized anxiety disorder: Secondary | ICD-10-CM

## 2020-01-23 DIAGNOSIS — F331 Major depressive disorder, recurrent, moderate: Secondary | ICD-10-CM

## 2020-01-23 NOTE — Progress Notes (Signed)
Virtual Visit via Video Note  Therapist-home office Patient-home I connected with [name] on 01/23/20 at  9:00 AM EDT by a video enabled telemedicine application and verified that I am speaking with the correct person using two identifiers.   I discussed the limitations of evaluation and management by telemedicine and the availability of in person appointments. The patient expressed understanding and agreed to proceed.   I discussed the assessment and treatment plan with the patient. The patient was provided an opportunity to ask questions and all were answered. The patient agreed with the plan and demonstrated an understanding of the instructions.   The patient was advised to call back or seek an in-person evaluation if the symptoms worsen or if the condition fails to improve as anticipated.  I provided 50 minutes of non-face-to-face time during this encounter.  THERAPIST PROGRESS NOTE  Session Time: 9:00 AM to 9:50 AM  Participation Level: Active  Behavioral Response: CasualAlertAnxious and Depressed  Type of Therapy: Individual Therapy  Treatment Goals addressed:  stress management, supportive interventions to help patient be caretaker for many different people and family, decrease anxiety. Interventions: Solution Focused, Strength-based, Supportive and Other: coping  Summary: JAIDIN UGARTE is a 49 y.o. female who presents with Mom's COPD exasperated and having a hard time breathing. It is pretty scary. It is just hard (patient tearful in sharing her feelings). Not sleeping, looking at her phone during the night which lets her know mom's medical status. Mom's sister is in the hospital with COPD and sister in [place] has it too. Patient describes state of constant worry. Patient is exhausted. [name] had a dress and it fits so doesn't have to go shopping for a dress, that is one thing taken care of.  Therapist pointed this out as a relief for patient. Stress is  significant for patient so reviewed helpful coping skills.  She does her deep breathing. Reviewed coping includes using her sense of humor. Discussed recreation activities. Brought her dinner last night and played pool. Emotional release helps cope and discussed therapy helps her with this. Does give herself mini-breaks when she does her games on the phone. Discussed constructive thinking as helpful which we work on in therapy to help change direction of negative thoughts. She relaxes by going  grocery shopping, drives and listens to music. Going to the grocery store is a break. Spending time with mom is hard to do because she has a headache and sleeps all day. Hope steroids help. Describes for Mom therapist would be amazed that it takes energy to breath. Takes a lot of calories to get her to breath.  In session has a baby monitor can hear if she hollers or the machine goes off. Therapist says it gives her an idea of constant stress for patient. Talked about doing things they can enjoy togetherShows mom funny videos on Facebook in the morning.  Patient shares that she carries all the stress nobody to share it with it. Nobody else gives her break and would be nice like her Dad say I will come over and you can sleep for an hour. 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  The past related patient is probably stressful and leaving mom in somebody's care knowing she knows how to take care of her the best and patient agreed with this.      Suicidal/Homicidal: No  Therapist Response: Therapist reviewed symptoms, facilitated expression of thoughts and feelings reviewed coping strategies for stress identified 1 specifically helpful for patient including therapy as a emotional release.  Provided education and explaining language because of the ability to contain her emotions.  The more we talked about her internal  world more we can contain our feelings effectively.  Also distraction helpful, work with patient on constructive thinking to help her cope to counter negative thinking such as mom is very lucky to have her to take care of her patient being rewarded by giving mom the care she needed when she was sick.  Patient being able to provide comfort and safety to her mom validated patient on the stress she is feeling but encouraging her to take care of herself to be a good caretaker to her mom needing to do that to be a better caretaker gust recreational activities, sense of humor self nurturing always helpful for stress management.  Encourage patient to stay focused on her resources strengths and resiliency.  Provided active listening open questions supportive interventions  Plan: Return again in 4 weeks.(therapist on vacation) 2.Therapist work with patient on coping, supportive interventions, facilitating expression of thoughts and feelings.   Diagnosis: Axis I:  Major Depressive Disorder, recurrent, moderate (history of bipolar) Generalized Anxiety Disorder    Axis II: No diagnosis    Coolidge Breeze, LCSW 01/23/2020

## 2020-01-25 ENCOUNTER — Ambulatory Visit (INDEPENDENT_AMBULATORY_CARE_PROVIDER_SITE_OTHER): Payer: Medicare Other | Admitting: Family Medicine

## 2020-01-25 ENCOUNTER — Other Ambulatory Visit: Payer: Self-pay

## 2020-01-25 VITALS — BP 106/70 | HR 91 | Wt 167.0 lb

## 2020-01-25 DIAGNOSIS — R1011 Right upper quadrant pain: Secondary | ICD-10-CM | POA: Diagnosis not present

## 2020-01-25 NOTE — Progress Notes (Signed)
    SUBJECTIVE:   CHIEF COMPLAINT / HPI:   Right upper quadrant pain Diane Pacheco reports that she has been having right-sided stomach pain for roughly the past 1-1/2 weeks.  She describes her pain as a dull aching sensation in her right upper abdomen.  There seems to be a single point that is most tender.  She does not associate this pain with eating or certain times of the day.  It does not seem to be worse with lying down.  It does seem to be exacerbated by twisting or certain movements.  She otherwise feels well and has not noticed any fever, shortness of breath, cough, sore throat, nausea, vomiting, diarrhea.  Her only abdominal surgery was a laparoscopic tubal ligation.  PERTINENT  PMH / PSH: Laparoscopic tubal ligation  OBJECTIVE:   BP 106/70   Pulse 91   Wt 167 lb (75.8 kg)   LMP 01/13/2020   SpO2 98%   BMI 27.79 kg/m    General: Alert and cooperative and appears to be in no acute distress HEENT: Neck non-tender without lymphadenopathy, masses or thyromegaly Cardio: Normal S1 and S2, no S3 or S4. Rhythm is regular. No murmurs or rubs.   Pulm: Clear to auscultation bilaterally, no crackles, wheezing, or diminished breath sounds. Normal respiratory effort Abdomen: Bowel sounds normal. Abdomen soft with guarding when palpating the right upper quadrant.  Some localized tenderness with palpation in the right upper quadrant at about the level of the umbilicus.  Less tenderness along the subcostal margin.  No significant lower quadrant tenderness or left quadrant tenderness.  No skin changes noted.  No palpable mass on my exam. Extremities: No peripheral edema. Warm/ well perfused.  Strong radial pulse. Neuro: Cranial nerves grossly intact  ASSESSMENT/PLAN:   Right upper quadrant pain The differential certainly includes gallbladder pathology including cholecystitis, choledocholithiasis, liver capsule inflammation, liver mass, diaphragmatic irritation, abdominal hernia or muscular  abdominal pain.  For now, we will begin with ruling out significant gallbladder and liver abnormalities. -Follow-up right upper quadrant ultrasound -Follow-up CBC, CMP     Mirian Mo, MD [facility]

## 2020-01-25 NOTE — Patient Instructions (Signed)
Right upper quadrant pain: I am sorry that you for this ongoing pain for 2 weeks.  I am not positive with the issues but it sounds like it could be related to your gallbladder.  We did get some initial blood work to look for signs of infection and gallbladder disease.  We will also get some imaging to take a closer look at your gallbladder.  For pain control, I would recommend you continue trying Tylenol and Aleve for now.

## 2020-01-25 NOTE — Assessment & Plan Note (Signed)
The differential certainly includes gallbladder pathology including cholecystitis, choledocholithiasis, liver capsule inflammation, liver mass, diaphragmatic irritation, abdominal hernia or muscular abdominal pain.  For now, we will begin with ruling out significant gallbladder and liver abnormalities. -Follow-up right upper quadrant ultrasound -Follow-up CBC, CMP

## 2020-01-26 LAB — CBC
Hematocrit: 39 % (ref 34.0–46.6)
Hemoglobin: 13.4 g/dL (ref 11.1–15.9)
MCH: 35.5 pg — ABNORMAL HIGH (ref 26.6–33.0)
MCHC: 34.4 g/dL (ref 31.5–35.7)
MCV: 103 fL — ABNORMAL HIGH (ref 79–97)
Platelets: 234 10*3/uL (ref 150–450)
RBC: 3.77 x10E6/uL (ref 3.77–5.28)
RDW: 13.4 % (ref 11.7–15.4)
WBC: 8 10*3/uL (ref 3.4–10.8)

## 2020-01-26 LAB — COMPREHENSIVE METABOLIC PANEL
ALT: 12 IU/L (ref 0–32)
AST: 17 IU/L (ref 0–40)
Albumin/Globulin Ratio: 2.2 (ref 1.2–2.2)
Albumin: 4.1 g/dL (ref 3.8–4.8)
Alkaline Phosphatase: 70 IU/L (ref 44–121)
BUN/Creatinine Ratio: 11 (ref 9–23)
BUN: 8 mg/dL (ref 6–24)
Bilirubin Total: 0.3 mg/dL (ref 0.0–1.2)
CO2: 21 mmol/L (ref 20–29)
Calcium: 9.1 mg/dL (ref 8.7–10.2)
Chloride: 100 mmol/L (ref 96–106)
Creatinine, Ser: 0.73 mg/dL (ref 0.57–1.00)
GFR calc Af Amer: 114 mL/min/{1.73_m2} (ref >59)
GFR calc non Af Amer: 99 mL/min/{1.73_m2} (ref >59)
Globulin, Total: 1.9 g/dL (ref 1.5–4.5)
Glucose: 97 mg/dL (ref 65–99)
Potassium: 4.7 mmol/L (ref 3.5–5.2)
Sodium: 135 mmol/L (ref 134–144)
Total Protein: 6 g/dL (ref 6.0–8.5)

## 2020-01-31 ENCOUNTER — Ambulatory Visit
Admission: RE | Admit: 2020-01-31 | Discharge: 2020-01-31 | Disposition: A | Discharge status: Home / Self Care | Payer: Medicare Other | Source: Ambulatory Visit | Attending: Family Medicine | Admitting: Family Medicine

## 2020-01-31 DIAGNOSIS — R1011 Right upper quadrant pain: Secondary | ICD-10-CM | POA: Diagnosis not present

## 2020-02-04 ENCOUNTER — Encounter (HOSPITAL_COMMUNITY): Payer: Self-pay | Admitting: Family Medicine

## 2020-02-04 DIAGNOSIS — K760 Fatty (change of) liver, not elsewhere classified: Secondary | ICD-10-CM | POA: Insufficient documentation

## 2020-02-21 ENCOUNTER — Ambulatory Visit (INDEPENDENT_AMBULATORY_CARE_PROVIDER_SITE_OTHER): Payer: Medicare Other | Admitting: Licensed Clinical Social Worker

## 2020-02-21 DIAGNOSIS — F331 Major depressive disorder, recurrent, moderate: Secondary | ICD-10-CM

## 2020-02-21 DIAGNOSIS — F411 Generalized anxiety disorder: Secondary | ICD-10-CM

## 2020-02-21 NOTE — Progress Notes (Signed)
Virtual Visit via Video Note  I connected with Diane Pacheco on 02/21/20 at 10:00 AM EDT by a video enabled telemedicine application and verified that I am speaking with the correct person using two identifiers.  Location: Patient: car Provider: home office   I discussed the limitations of evaluation and management by telemedicine and the availability of in person appointments. The patient expressed understanding and agreed to proceed.   I discussed the assessment and treatment plan with the patient. The patient was provided an opportunity to ask questions and all were answered. The patient agreed with the plan and demonstrated an understanding of the instructions.   The patient was advised to call back or seek an in-person evaluation if the symptoms worsen or if the condition fails to improve as anticipated.  I provided 54 minutes of non-face-to-face time during this encounter.  THERAPIST PROGRESS NOTE  Session Time: 10:00 AM to 10:54 AM  Participation Level: Active  Behavioral Response: CasualAlertDysphoric and Irritable  Type of Therapy: Individual Therapy  Treatment Goals addressed:  stress management, supportive interventions to help patient be caretaker for many different people and family, decrease anxiety. Interventions: Solution Focused, Strength-based, Supportive and Other: coping  Summary: Diane Pacheco is a 49 y.o. female who presents with woke up and is tired. Speak up and makes it worse. Their empathy is buying her dinner. Wedding went well. "It was lovely", patient had sarcastic tone so therapist explored and it is the issue of patient even at the wedding having to be a caretaker for her mom.  Even with sister and brother only comfortable with patient. Brother and sister don't give her a break. Doesn't like to speak up doesn't like conflict. Would like a break even for a nap. Even with nap still has to listen to her alarms. Not getting any rest. Wants a International aid/development worker. Have something called respite care. Family wouldn't like it if put in a home and patient doesn't trust them. Limiting her mom in ways five cigarettes.  Therapist guided patient in discussing stressors taking care (patient acknowledges with Richardson Pacheco it is his way or no way. Patient has ways of getting her frustration out in subtle ways.. Reviewed ways to get breaks and that includes grocery shopping, plays her games, beers at night while working. Not going to do anything for Thanksgiving normally host it. Will do a small one and nobody invited. Has been watching "[employer]".  Discussed patient laughing is good for her and her stress.  Suicidal/Homicidal: No  Therapist Response: Therapist reviewed symptoms, facilitated expression of thoughts and feelings and encourage patient even though caretaker dealing with people who are very sick to set boundaries no matter how insignificant they seem on, no one will look out for her needs the way she can.  Discussed negative repercussions if she does not take care of herself to some extent negative impact on mental health noted anger issues, feelings of being overwhelmed.  Discussed people lack awareness of cells so sometimes we have to speak up to open their awareness and what she can ask in be insignificant just so they make an effort to consider her needs and have a little more empathy for her.  Assessed patient has difficulty but also provided positive feedback where she did set some boundaries discussed ways she could do that that would not have pushed back including sarcasm.  Reviewed coping to deal with stress including taking breaks, noted watching comities is helpful.  Engaged in problem solving  to help her with getting a break and noted limited options.  Engaged in problem solving with patient to help her getting breaks.  Therapist provided active listening, open questions supportive interventions.  Plan: Return again in 2 weeks.2.Therapist work with  patient on coping, supportive interventions, facilitating expression of thoughts and feelings.   Diagnosis: Axis I:  Major Depressive Disorder, recurrent, moderate (history of bipolar) Generalized Anxiety Disorder    Axis II: No diagnosis    Coolidge Breeze, LCSW 02/21/2020

## 2020-03-06 ENCOUNTER — Ambulatory Visit (INDEPENDENT_AMBULATORY_CARE_PROVIDER_SITE_OTHER): Payer: Medicare Other | Admitting: Licensed Clinical Social Worker

## 2020-03-06 DIAGNOSIS — F411 Generalized anxiety disorder: Secondary | ICD-10-CM

## 2020-03-06 DIAGNOSIS — F331 Major depressive disorder, recurrent, moderate: Secondary | ICD-10-CM

## 2020-03-06 NOTE — Progress Notes (Signed)
Virtual Visit via Video Note  I connected with Diane Pacheco on 03/06/20 at 10:00 AM EST by a video enabled telemedicine application and verified that I am speaking with the correct person using two identifiers.  Location: Patient: car Provider: home   I discussed the limitations of evaluation and management by telemedicine and the availability of in person appointments. The patient expressed understanding and agreed to proceed.  I discussed the assessment and treatment plan with the patient. The patient was provided an opportunity to ask questions and all were answered. The patient agreed with the plan and demonstrated an understanding of the instructions.   The patient was advised to call back or seek an in-person evaluation if the symptoms worsen or if the condition fails to improve as anticipated.  I provided 54 minutes of non-face-to-face time during this encounter.  THERAPIST PROGRESS NOTE  Session Time: 10:00 AM to 10:54 AM  Participation Level: Active  Behavioral Response: CasualAlertAnxious and Dysphoric  Type of Therapy: Individual Therapy  Treatment Goals addressed:  stress management, supportive interventions to help patient be caretaker for many different people and family, decrease anxiety.  Interventions: Solution Focused, Strength-based, Supportive and Other: coping  Summary: Diane Pacheco is a 49 y.o. female who presents with going to make a little dinner with Mom and [name] for Thanksgiving. Mom had bad breathing days, had to up the morphrine, decided to put a low dose of steroid to calm flare ups. Hospice an hour and half away so asked what to do. If too nervous will call ambulance. At least has back up. A plan. Scaring when she can't breath, Mom panics. Mom condition is to the point where having a hard time to wheel herself to the bathroom. Discussed highlights for her Mom that are helpful for Mom and patient as her caretaker. Got to see [employer] Has  a little "tiff" with Bear. Patient wants to rent a night at motel room for her birthday. Wants to have son stay with them with them. His issues is that he is not going to be with her, patient is going to get it anyway. If goes with her defies the purpose. Can't do what she wants to do. Still waiting on him like home the very thing she is trying to get away from. Going to get a massage, take out, beer, Benadryl, sleeps as late as can without worrying about bells, alarms going off. Wants to watch cooking network. Discover videos on Facebook that make her laugh. Watching "Karens" be outrageous. Discussed patient on high alert has always been like that worse with worries as a caretaker. Feels sorry for massage therapist will sit there and cry and get it out.  Discussed how massage therapy can be very helpful for release of emotions, providing comfort and encourage patient to do this more often       Suicidal/Homicidal: No  Therapist Response: Reviewed symptoms, facilitated expression of thoughts and feelings reviewed activities and compared them to being like medicine for patient including having a night stay by herself at a motel room, a massage, watching funny videos as laughing helps with coping.  Reinforced patient wanting to be on her own sharing when we can do her own thing for some of the times we can enjoy the most..  Discussed doing what we enjoy makes life worth living.  Validated patient on stressors she is dealing with such as mom struggling to breathe the anxiety that comes with that always being on hyperalert.  Discussed having a plan and having back up as a way to address the anxiety.  Facilitated expression of thoughts and feelings to help cope with stressors.  Therapist provided active listening open questions supportive interventions  Plan: Return again in 2 weeks.2.Marland KitchenTherapist work with patient on coping, supportive interventions, facilitating expression of thoughts and feelings.    Diagnosis: Axis I:  Major Depressive Disorder, recurrent, moderate (history of bipolar) Generalized Anxiety Disorder    Axis II: No diagnosis    Diane Breeze, LCSW 03/06/2020

## 2020-03-20 ENCOUNTER — Ambulatory Visit (INDEPENDENT_AMBULATORY_CARE_PROVIDER_SITE_OTHER): Payer: Medicare Other | Admitting: Licensed Clinical Social Worker

## 2020-03-20 DIAGNOSIS — F331 Major depressive disorder, recurrent, moderate: Secondary | ICD-10-CM

## 2020-03-20 DIAGNOSIS — F411 Generalized anxiety disorder: Secondary | ICD-10-CM

## 2020-03-20 NOTE — Progress Notes (Signed)
Virtual Visit via Video Note  I connected with Diane Pacheco on 03/20/20 at  9:00 AM EST by a video enabled telemedicine application and verified that I am speaking with the correct person using two identifiers.  Location: Patient: stationary car Provider: office   I discussed the limitations of evaluation and management by telemedicine and the availability of in person appointments. The patient expressed understanding and agreed to proceed.   I discussed the assessment and treatment plan with the patient. The patient was provided an opportunity to ask questions and all were answered. The patient agreed with the plan and demonstrated an understanding of the instructions.   The patient was advised to call back or seek an in-person evaluation if the symptoms worsen or if the condition fails to improve as anticipated.  I provided 53 minutes of non-face-to-face time during this encounter.   THERAPIST PROGRESS NOTE  Session Time: 9:00 AM to 9:53 AM  Participation Level: Active  Behavioral Response: CasualAlertAnxious  Type of Therapy: Individual Therapy  Treatment Goals addressed:  stress management, supportive interventions to help patient be caretaker for many different people and family, decrease anxiety. Interventions: Solution Focused, Strength-based, Supportive and Other: coping  Summary: Diane Pacheco is a 49 y.o. female who presents with last few days have been really bad breathing days for her. Thinks mom is going down a little bit. Oxygen meter not sure if working right think CO2 going up sleeping and confused. Worries her a lot. Feels bad yelled at her, bad breathing day and all she wants is a cigarette not going to wheel herself anywhere if she can smoke a cigarette she can wheel herself. Shortening her life right in front of her. On the hand asks what is the point at this point. Patient says normally ok only when struggling can't breath. Too late now and does calm  her down. Patient also acknowledges she smokes. She decided to put trip on hold. Mom had a flare up, ventilator hasn't been working right, oxygen dropping down. Not comfortable right now leaving. Birthday and take out and get massage. Always on high alert when it gets bad panic and call 911. Being on her own dealing with a crisis with her Mom is terrifying. Discussed positive moments for Mom and she likes her coffee and pretzels. She goes in to see Rodman Pickle and patient goes back and forth between chores and watching shows. Discussed plans for Christmas and having family over and everybody bring something. Discussed life history married to Ed almost 2 years and the second one was "weird" he wouldn't speak to anyone one by patient. Alroy Dust is Ed's child, Gwendolyn Fill is Mike's still married though separated about 19-20 years, Merrily Pew is from [place]. Patient is mom's "mini me" nervous and anxious all the time. Mom's new goal to live to see snow. Discussed what a positive goal and positive to set goals like that.   Suicidal/Homicidal: No  Therapist Response: Therapist reviewed symptoms, facilitated expression of thoughts and feelings as main coping strategy for patient to work through feelings related to stressors.  Focused on some of the positive experiences to look forward to and that she has that help with coping including her birthday, spending time with family for the holidays.  Discussed positive reinforcement that comes from creating positive experiences for her mom that cannot be rewarding for patient as well as mom, treasure in time together at this point do what she can to make the most of it because of mom's  terminal condition.  Therapist met session as well exploring patient's past to enhance relationship by getting to know more patient's history.  Emphasized patient strengths of being easygoing is a very positive quality that therapist appreciates, assess very positive for her relationships, therapist provided active  listening open questions supportive interventions  Plan: Return again in 2 weeks.2.Therapist work with patient on coping, supportive interventions, facilitating expression of thoughts and feelings.   Diagnosis: Axis I:   Major Depressive Disorder, recurrent, moderate (history of bipolar) Generalized Anxiety Disorder    Axis II: No diagnosis    Cordella Register, LCSW 03/20/2020

## 2020-04-03 ENCOUNTER — Ambulatory Visit (INDEPENDENT_AMBULATORY_CARE_PROVIDER_SITE_OTHER): Payer: Medicare Other | Admitting: Licensed Clinical Social Worker

## 2020-04-03 DIAGNOSIS — F411 Generalized anxiety disorder: Secondary | ICD-10-CM | POA: Diagnosis not present

## 2020-04-03 DIAGNOSIS — F331 Major depressive disorder, recurrent, moderate: Secondary | ICD-10-CM | POA: Diagnosis not present

## 2020-04-03 DIAGNOSIS — Z23 Encounter for immunization: Secondary | ICD-10-CM | POA: Diagnosis not present

## 2020-04-03 NOTE — Progress Notes (Signed)
Virtual Visit via Video Note  I connected with [name] on 04/03/20 at  9:00 AM EST by a video enabled telemedicine application and verified that I am speaking with the correct person using two identifiers.  Location: Patient: car Provider: office   I discussed the limitations of evaluation and management by telemedicine and the availability of in person appointments. The patient expressed understanding and agreed to proceed.   I discussed the assessment and treatment plan with the patient. The patient was provided an opportunity to ask questions and all were answered. The patient agreed with the plan and demonstrated an understanding of the instructions.   The patient was advised to call back or seek an in-person evaluation if the symptoms worsen or if the condition fails to improve as anticipated.  I provided 53 minutes of non-face-to-face time during this encounter.  THERAPIST PROGRESS NOTE  Session Time: 9:00 AM to 9:53 AM  Participation Level: Active  Behavioral Response: CasualAlertAnxious and Dysphoric  Type of Therapy: Individual Therapy  Treatment Goals addressed:  stress management, supportive interventions to help patient be caretaker for many different people and family, decrease anxiety. Interventions: Solution Focused, Strength-based, Supportive and Other: coping  Summary: TWANDA STAKES is a 49 y.o. female who presents with didn't sleep well. Whole household up at 7:58 AM. "Has mom's mask farting under control." Mom has been put on steroid and when you do get chipmunk cheeks. There is a leak. COPD has been building back up. Yesterday was all over the place. Lethargic and she can't stay awake, can't communicate and only can get ok. All she can do is put on her respirator buys her about three hours at a time. Did break down yesterday and asked for help. Called Kelly crying this is her respirator therapist she is like a friend and can talk to her anytime.  Told her that she needs help. Told her to ask to  put in application through Child psychotherapist for a home health care worker. Give her a few hours to take a nap and clean the house. Has to go [facility]. Medicare will apply the hospice. Hospice social worker puts it in. Why didn't suggest all the options before. Discussed there frustration that nobody said anything before and therapist validated on this. Maybe be better with help to get basic things done. Patient shares she  "Lost my shit in a parking lot" What did it was two teenages in a handicap spot. Poured a drink on them. "It felt so good." Parking lot are her triggers people should be polite. Bear let her know could be dangerous and not the policeman of the parking lot and patient realizes this. Therapist and patient tTalked about passing on good karma. People want to see mom and do one last get together. House is trashed and no time do anything. It will have to be what it is. Didn't do very much for birthday mom has been bad last few weeks and don't like leaving her for a long time.        Suicidal/Homicidal: No  Therapist Response: Therapist reviewed symptoms, facilitated expression of thoughts and feelings to help in managing patient stress and processing through feelings. Noted she had an incident where she erupted when triggered in a parking lot, discussed how given all her stressors why she could be at risk for this happening, reviewed trigger also said this is what therapy is for her to help her not blow up and in this incident  didn't prevent it although in general helpful. Noted she did feel good when she reacted to what she said was on just my responding. Processed feelings related to frustration of not hearing about home health and also the reframing of how that's going to help decrease her stress hopefully. Therapist and patient engaged in a positive conversation about passing on good karma ways patient does that and assess helpful to focus  on positive acts, positive things about life helps with mood. Therapist provided active listening, open questions supportive interventions  Plan: Return again in 2 weeks.2.  Therapist work on Optician, dispensing, supportive interventions and coping  Diagnosis: Axis I:   Major Depressive Disorder, recurrent, moderate (history of bipolar) Generalized Anxiety Disorder    Axis II: No diagnosis    Coolidge Breeze, LCSW 04/03/2020

## 2020-04-17 ENCOUNTER — Ambulatory Visit (INDEPENDENT_AMBULATORY_CARE_PROVIDER_SITE_OTHER): Payer: Medicare Other | Admitting: Licensed Clinical Social Worker

## 2020-04-17 DIAGNOSIS — F331 Major depressive disorder, recurrent, moderate: Secondary | ICD-10-CM | POA: Diagnosis not present

## 2020-04-17 DIAGNOSIS — F411 Generalized anxiety disorder: Secondary | ICD-10-CM | POA: Diagnosis not present

## 2020-04-17 NOTE — Progress Notes (Signed)
Virtual Visit via Video Note  I connected with Diane Pacheco on 04/17/20 at 11:00 AM EST by a video enabled telemedicine application and verified that I am speaking with the correct person using two identifiers.  Location: Patient: car Provider: home office   I discussed the limitations of evaluation and management by telemedicine and the availability of in person appointments. The patient expressed understanding and agreed to proceed.  I discussed the assessment and treatment plan with the patient. The patient was provided an opportunity to ask questions and all were answered. The patient agreed with the plan and demonstrated an understanding of the instructions.   The patient was advised to call back or seek an in-person evaluation if the symptoms worsen or if the condition fails to improve as anticipated.  I provided 55 minutes of non-face-to-face time during this encounter.  THERAPIST PROGRESS NOTE  Session Time: 11:00 AM to 11:55 AM  Participation Level: Active  Behavioral Response: CasualAlertEuthymic  Type of Therapy: Individual Therapy  Treatment Goals addressed:  stress management, supportive interventions to help patient be caretaker for many different people and family, decrease anxiety. Interventions: Solution Focused, Strength-based, Supportive and Other: coping  Summary: Diane Pacheco is a 49 y.o. female who presents with sharing about her  Christmas and on Christmas her whole family came over to see her Mom. Stress related to interacting with her sister. Mom loved seeing the family. Discussed patient's supports she has a Diane Pacheco (her oldest son) who comes over every week and "he is my rock." He is a Production designer, theatre/television/film that sells rocks, fancy rocks like granite. Youngest son had first date. He was raised by Dad since one. They sent him to [place] all his life. Daughter is anxious and 22 and excited she made her first doctor's appointment "I was over the  moon." She has a learning disability; reads at a fourth grade level and has dyslexia and pushed her through school; still is immature and naive. Wants her to stay on birth control otherwise would be impossible situation to raise baby. Raised Diane Pacheco and Diane Pacheco until Diane Pacheco 12 and couldn't do it anymore, had Diane Pacheco couldn't take care of another one, Dad took Diane Pacheco and he is Electrical engineer and patient's Network engineer. Don't see as much as like with college, the girl, going to [employer] with community college. Patient wanted to be a paramedic in high school, she was part of Psychologist, educational" part of boys scouts. 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 Didn't finish high school because of drugs and alcohol and partying. Still smoke marijuana. Was horrible teenager like that until 39 when pregnant with Diane Pacheco, did everything except heroin drinking and marijuana after babies. Diane Pacheco and patient did crack together so Mom watched the kids. His dad died, moved in with mom and got tired of using. Hard to do when both doing it. No emergencies for mom lately. Night before fed mom and went in to see Diane Pacheco. Cuddled and fell asleep woke up in the middle of the night and mom was on the couch. "It was so funny." Did something to make Diane Pacheco cry gave him a card to show how much she appreciated him. Pacheco makes her happy, pays attention to what she says. Kind to people. Discussed the mutual benefit of being kind to people.     Suicidal/Homicidal: No  Therapist Response: Therapist reviewed symptoms, facilitated expression of thoughts and feelings and noted patient's thoughtfulness  with people, that she goes out of her way more than a lot of people and how meaningful it is for them to be appreciated, that they deserve to be appreciated and it also has a positive effect on patient to see the effect for them of being appreciated.  Discussed mental health benefit when we are doing for others it helps  our mental health.  But noted patient's particular strength in this area of extending herself more than a lot of people.  Spent time the session talking about her kids and therapist feels helpful to know more about important relationships in her life explored the more in depth, helps to know patient better and support she has in her life.  Processed patient's feelings as an important treatment intervention to work through stressors in note currently stressors have been contained having a positive impact on patient's mental health.  Therapist provided active listening, open questions, supportive interventions  Plan: Return again in 2 weeks.2.Therapist work on Optician, dispensing, supportive interventions and coping  Diagnosis: Axis I:  Major Depressive Disorder, recurrent, moderate (history of bipolar) Generalized Anxiety Disorder    Axis II: No diagnosis    Diane Breeze, LCSW 04/17/2020

## 2020-05-01 ENCOUNTER — Other Ambulatory Visit: Payer: Self-pay

## 2020-05-01 ENCOUNTER — Ambulatory Visit (INDEPENDENT_AMBULATORY_CARE_PROVIDER_SITE_OTHER): Payer: Medicare Other | Admitting: Licensed Clinical Social Worker

## 2020-05-01 DIAGNOSIS — F331 Major depressive disorder, recurrent, moderate: Secondary | ICD-10-CM | POA: Diagnosis not present

## 2020-05-01 DIAGNOSIS — F411 Generalized anxiety disorder: Secondary | ICD-10-CM

## 2020-05-01 NOTE — Progress Notes (Signed)
Virtual Visit via Video Note  I connected with Diane Pacheco on 05/01/20 at 11:00 AM EST by a video enabled telemedicine application and verified that I am speaking with the correct person using two identifiers.  Location: Patient: car Provider: home office   I discussed the limitations of evaluation and management by telemedicine and the availability of in person appointments. The patient expressed understanding and agreed to proceed.   I discussed the assessment and treatment plan with the patient. The patient was provided an opportunity to ask questions and all were answered. The patient agreed with the plan and demonstrated an understanding of the instructions.   The patient was advised to call back or seek an in-person evaluation if the symptoms worsen or if the condition fails to improve as anticipated.  I provided 54 minutes of non-face-to-face time during this encounter.   THERAPIST PROGRESS NOTE  Session Time: 11:00 PM to 11:54 PM  Participation Level: Active  Behavioral Response: CasualAlertAngry, Anxious and Dysphoric  Type of Therapy: Individual Therapy  Treatment Goals addressed: stress management, supportive interventions to help patient be caretaker for family members, emotional regulation for anxiety, anger, depression, strategies to help her in caretaking that will help with stress   Interventions: Solution Focused, Strength-based, Anger Management Training, Reframing and Other: coping  Summary: Diane Pacheco is a 50 y.o. female who presents with stressed and mad. Lost "my shit over hot dogs who does this." Irritated at the world and nothing in particular. Tired of dishes, cooking and shopping and then do it again. Don't get everything done and [name] gets mad about things. He tries to calm her but when on roll of cussing and he doesn't like that. Haven't had a shower since Christmas always been like this since a kid. Doesn't feel better after a shower,  somehow maybe allergic to water. Therapist utilized motivational strategies of how shower would make her feel better but patient relates does not find a positive experience for her. Therapist guided patient in recognizing why wouldn't be angry and has to go somehow. Patient says she always takes it out on Bear and doesn't know why. She has no where to go, no time to anything. Whole kitchen is trashed. It is too much and overwhelming. Get half of done and then stare at it. Explored reason she doesn't get it done. She "spins". There is so much to do and doesn't know where to start. Last night told them "the hell with them" and went out to the truck had a joint and listened to music. Therapist pointed out this seems a necessity and to put it into her schedule something helpful. Got in trouble for that don't spend time with Mila Doce because busy. Explored why he doesn't understand what she needs. Patient's tone was demanding and angry so why might not understand. Bad day yesterday didn't eat, shopping and felt ill. Crying because couldn't find hot dogs at the supermarket. [name] is worried about patient having to go the hospital, her breaking down, there has been more scratching and crying. Finally called to set up services for patient to help help taking care of mom. Coming next week. It doesn't help, that everyday is the same. Nothing to look forward to. Been doing it for a year. Always at work. Can't leave mom ever. Reviewed session and patient sees giving herself breaks as helpful asking that when she is calmer and not so upset.        Therapist reviewed symptoms, facilitated expression  of thoughts and feelings and reviewed needing to put in her schedule time for self-care important part of taking care of herself, her wellbeing and mental health that will help in reducing explosive episodes, helping to take care of her self so she is a better caretaker. Reframed this as taking medicine so as necessary is taking a pill  for her condition. Discussed may take some planning, some 4 thought to make it happen such as giving herself 3 hours off a month, planning who will sit in while she has some time off. Discussed the need to have something to look forward to as important, important for everybody so applies to patient equally. Also perspective that inevitable there will be days that are too much and will have to realize they will come, that despite our best efforts to prevent them that we are human in the reality of being human that we are imperfect. Utilize analogy of balloon and finding ways to deflate the balloon without it exploding, discussed things like taking 20-minute breaks as ways to deflate some of the stress to help prevent her from exploding. Reviewed patient has minimal needs in giving her perspective that she is asking for very minimal and encouraging her to ask for things she needs. Also looked at service coming into help her, this being hopeful and possibly positive that can help patient with perspective. Reviewed treatment plan and patient gave consent to complete virtually. Therapist provided active listening open questions supportive interventions Suicidal/Homicidal: No  Plan: Return again in 2 weeks.2. therapist work with patient on anger management, stress more management  Diagnosis: Axis I: Major Depressive Disorder, recurrent, moderate (history of bipolar) Generalized Anxiety Disorder    Axis II: No diagnosis    Coolidge Breeze, LCSW 05/01/2020

## 2020-05-16 ENCOUNTER — Ambulatory Visit (INDEPENDENT_AMBULATORY_CARE_PROVIDER_SITE_OTHER): Payer: Medicare Other | Admitting: Licensed Clinical Social Worker

## 2020-05-16 DIAGNOSIS — F411 Generalized anxiety disorder: Secondary | ICD-10-CM

## 2020-05-16 DIAGNOSIS — F331 Major depressive disorder, recurrent, moderate: Secondary | ICD-10-CM | POA: Diagnosis not present

## 2020-05-16 NOTE — Progress Notes (Signed)
Virtual Visit via Video Note  I connected with Diane Pacheco on 05/16/20 at  9:00 AM EST by a video enabled telemedicine application and verified that I am speaking with the correct person using two identifiers.  Location: Patient: home Provider: home office   I discussed the limitations of evaluation and management by telemedicine and the availability of in person appointments. The patient expressed understanding and agreed to proceed.   I discussed the assessment and treatment plan with the patient. The patient was provided an opportunity to ask questions and all were answered. The patient agreed with the plan and demonstrated an understanding of the instructions.   The patient was advised to call back or seek an in-person evaluation if the symptoms worsen or if the condition fails to improve as anticipated.  I provided 53 minutes of non-face-to-face time during this encounter.   THERAPIST PROGRESS NOTE  Session Time: 9:00 AM to 9:53 AM  Participation Level: Active  Behavioral Response: CasualAlertAnxious  Type of Therapy: Individual Therapy  Treatment Goals addressed:  stress management, supportive interventions to help patient be caretaker for family members, emotional regulation for anxiety, anger, depression, strategies to help her in caretaking that will help with stress  Interventions: Solution Focused, Strength-based, Supportive and Other: coping  Summary: Diane Pacheco is a 50 y.o. female who presents with mood better at this session and shares part of life having ups and downs. Doctor didn't fill in paperwork so denied help. Mom's social worker is going fix the paperwork. Shouldn't happen could have been a quick fix, call the doctor especially when someone on hospice. Mom having a good breathing week. Doesn't have to stress as much when that happens. Numbers were getting too high have to call the respiratory therapist.Talked about having a good neighborhood. A  community where neighbors help each other. Will be playing pool tonight talked Bear down to one night a week more then that she is too tired. Discussed how she unwinds especially with the weekend and normally cooking. This is her self-soothing activity. Discussed different types of activities can do with senses and for patient it is cooking. She likes physically cooking. Plans to make [place] Beef. If sit down fall asleep. As long as keep moving. [name] has been good for past few weeks, when lay mom down lets patient lay down gets rest in the afternoon.  Therapist felt this was very helpful development for patient especially with demands on getting to the point of being exhausted Therapist reviewed symptoms, facilitated expression of thoughts and feelings utilize processing feelings as main intervention to help patient cope with stressors, identifying and releasing feelings as a way to help with mood.  Therapist guided patient in talking about activities that can help mood such as activity she can enjoy.  Talked about benefits of self soothing activities using senses how that helps to calm the body down, how these type of activities would be helpful for patient brainstormed on different self soothing activities and noted for patient it is cooking discussed the many benefits of cooking both the process of cooking, the smells involved, the visual aspect and the reward of putting a meal together.  Noted another self soothing activity is watching television shows.  Noted patient's affect is less stressed today, assess this is positive that patient's mood has its ups and downs as normal variations in mood.  Noted positive development that she is able to get a nap in her schedule think this is very helpful for  her as getting some rest when she is caretaker and gets exhausted from the work involved in taking care of all needs of her partner mom.  Therapist provided active listening open questions supportive interventions           Suicidal/Homicidal: No  Plan: Return again in 2 weeks.2.  Work with patient on stress management, coping provide supportive interventions  Diagnosis: Axis I:  Major Depressive Disorder, recurrent, moderate (history of bipolar) Generalized Anxiety Disorder    Axis II: No diagnosis    Diane Breeze, LCSW 05/16/2020

## 2020-05-29 ENCOUNTER — Ambulatory Visit (INDEPENDENT_AMBULATORY_CARE_PROVIDER_SITE_OTHER): Payer: Medicare Other | Admitting: Licensed Clinical Social Worker

## 2020-05-29 DIAGNOSIS — F331 Major depressive disorder, recurrent, moderate: Secondary | ICD-10-CM

## 2020-05-29 DIAGNOSIS — F411 Generalized anxiety disorder: Secondary | ICD-10-CM | POA: Diagnosis not present

## 2020-05-29 NOTE — Progress Notes (Signed)
Virtual Visit via Video Note  I connected with Diane Pacheco on 05/29/20 at  9:00 AM EST by a video enabled telemedicine application and verified that I am speaking with the correct person using two identifiers.  Location: Patient: stationary car Provider: home office   I discussed the limitations of evaluation and management by telemedicine and the availability of in person appointments. The patient expressed understanding and agreed to proceed.   I discussed the assessment and treatment plan with the patient. The patient was provided an opportunity to ask questions and all were answered. The patient agreed with the plan and demonstrated an understanding of the instructions.   The patient was advised to call back or seek an in-person evaluation if the symptoms worsen or if the condition fails to improve as anticipated.  I provided 53 minutes of non-face-to-face time during this encounter.   THERAPIST PROGRESS NOTE  Session Time: 9:00 AM to 9:53 AM  Participation Level: Active  Behavioral Response: CasualAlertAnxious and Euthymic  Type of Therapy: Individual Therapy  Treatment Goals addressed:  stress management, supportive interventions to help patient be caretaker for family members, emotional regulation for anxiety, anger, depression, strategies to help her in caretaking that will help with stress  Interventions: Solution Focused, Strength-based, Supportive and Other: coping  Summary: Diane Pacheco is a 50 y.o. female who presents with every day is the same this comment in response to upcoming Valentine's Day and usually don't do anything for that. Played pool and surprised Bear and he said that is the nicest thing someone has done. [name]'s birthday is Saturday going to get her card she has everything. Super bowl on Sunday. Husband is not a sports guy, he is movie guy but only one that they watch. Will cook this Sunday. Discussed scents can be helpful for coping  that smelling is our most powerful sense. Patient remembers using lavender baby wash for kids and helped her daughter Fredric Mare who has anxiety. Finally caught up on dishes, "happy dance" have a counter. Broke out the [employer] a tool for cooking discussed keeping up with the cleaning hard because choice cleaning or taking a nap and sometimes needs a nap. Talked about childhood was the only one taking care of the household. Has taken care of people all her life. 8th and 9th grade Dad married and moved out. Patient stayed and took care of grandfather at the house. He was really sick. Was too little to say good-bye at the hopsital but was his caretaker. Grandfather didn't want anybody else always good at it. More freedom house turned into a party house. After grandfather died moved in with Dad. Mom called her boyfriend had a heart attack. Moved to [place] with his mom. Was really mean to patient's mom. Patient came and got her. Boyfriend ended up killing himself because mom left. She ended up meeting her husband and patient met her first husband and started having babies. Marriage was horrible anytime get married only last a year. Why doesn't want to get married. Didn't know it but was married a crack. Alroy Dust still doesn't talk to his Dad. Ended up crack years lately. Why? Mental illness end up with people don't want to be with and doing things don't want to do because of bipolar Lasted three years. Bear and her stopped after Daddy died. Removed themselves from neighborhood. 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 "Give me a rake" drains the nervous energy get  from bipolar have to get out somehow. In session talking about subject made both patient and therapist laugh and access helpful treatment intervention.      Therapist reviewed symptoms, facilitated expression of thoughts and feelings, assessed patient actively in positive activities indication of positive mood.  Noted  mental health strategy patient engaged in when she got positive feedback from her significant other about spending time with him and now one of the nicest things how that not only helps other people but can help ourselves when we do nice things for people.  Reviewed other coping strategies that help improve mood such as sense, patient implements this by her cooking.  Provided supportive intervention and reviewing how patient is the only when doing chores in the house and can be hard.  Reviewed her history noted her strength and also the role she had most of her life as a caretaker.  Discussed patient good at it but also can be demanding.  Assess helpful to know more patient's history and working with patient to develop helpful coping strategies.  Noted insight from her history of our environment can have a significant impact on [place], who we surround herself with can have significant impact.  Therapist provided active listening, open questions, supportive interventions. Suicidal/Homicidal: No  Plan: Return again in 2 weeks. 2. Work with patient on stress management, coping provide supportive interventions  Diagnosis: Axis I:   Major Depressive Disorder, recurrent, moderate (history of bipolar) Generalized Anxiety Disorder     Axis II: No diagnosis    Cordella Register, LCSW 05/29/2020

## 2020-06-12 ENCOUNTER — Ambulatory Visit (INDEPENDENT_AMBULATORY_CARE_PROVIDER_SITE_OTHER): Payer: Medicare Other | Admitting: Licensed Clinical Social Worker

## 2020-06-12 DIAGNOSIS — F331 Major depressive disorder, recurrent, moderate: Secondary | ICD-10-CM | POA: Diagnosis not present

## 2020-06-12 DIAGNOSIS — F411 Generalized anxiety disorder: Secondary | ICD-10-CM | POA: Diagnosis not present

## 2020-06-12 NOTE — Progress Notes (Signed)
Virtual Visit via Video Note  I connected with Diane Pacheco on 06/12/20 at  9:00 AM EST by a video enabled telemedicine application and verified that I am speaking with the correct person using two identifiers.  Location: Patient: in stationary car Provider: home office   I discussed the limitations of evaluation and management by telemedicine and the availability of in person appointments. The patient expressed understanding and agreed to proceed.   I discussed the assessment and treatment plan with the patient. The patient was provided an opportunity to ask questions and all were answered. The patient agreed with the plan and demonstrated an understanding of the instructions.   The patient was advised to call back or seek an in-person evaluation if the symptoms worsen or if the condition fails to improve as anticipated.  I provided 53 minutes of non-face-to-face time during this encounter.   THERAPIST PROGRESS NOTE  Session Time: 9:00 AM to 9:53 AM  Participation Level: Active  Behavioral Response: CasualAlertstressed and euthymic  Type of Therapy: Individual Therapy  Treatment Goals addressed:  stress management, supportive interventions to help patient be caretaker for family members, emotional regulation for anxiety, anger, depression, strategies to help her in caretaking that will help with stress  Interventions: Solution Focused, Strength-based, Supportive and Other: coping  Summary: Diane Pacheco is a 50 y.o. female who presents with crazy morning. Forgot to take out the garbage had to run down the garbage man in bare feet. Patient shares that at least caught them. Clean out the closet last night in kitchen with noticing food been in there too long. (Therapist assesses this is positive indication of mood patient engaging in projects in the home having energy to do that) [name] has two appointments with pulmonologist and doctor and he wants to be under 400 lbs. Right  now stressed because made Bear the two appointments nobody to sit with her Mom. Daughter has a job at [employer] which is fantastic" she has reading disability and dyslexic. Always she has to do is hang clothes on hangers for $12/ hour. Hard to find a job suited for her stress wise and limitations with reading and numbers hard to find work she can do. Going to call social worker to move on things can't wait while they can't get an application straight to get help in the house. Getting into situations now where somebody has to be with her at all times. For example oxygen gets loose. Patient identifies for her in session cathectic relief get it out and get a relief. Used to go to Mom to release things but now it is about her and can't do it. Helps to have another point of view on things.. Explored her strength as caretaker used to taking over and doing it. Lately mom has been going to sleep on couch and when goes to grocery store ready for nap and that is when mom usually has a nap but now she sneaks in the nap beforehand and patient gets so mad. Doesn't get her hour and half break because she snuck in a nap. Realizes Mom is sick so need to give her room to do these kind of things. Found a funny show from [facility] that is really funny. Nice to see Mom laughing.    Therapist reviewed symptoms, facilitated expression of thoughts and feelings noted positive activities that are helping patient with mood, including watching a comedy that was positive for patient both because her mom was laughing therapist pointed out this  is the strategy for mood management to watch something funny to help improve mood.  Validated patient on her struggles, agreeing patient needs help and has to do with needing that help.  Help her with her stressors.  Provided education on emotional management why therapy is helpful how important it is understanding and naming her emotions tied to learning and growth.  Having emotional granularity how  granulated can you get when identifying what experiencing what we know the more accurately you name what you are feeling the better you can regulate it the better you can move through it for positive emotions, so in therapy were patient is naming and processing her emotions helps her in regulating it further the more you can replicated it in terms of positive emotions the more you invite that experience more into your life.  Discussed how important language is that the limits of the our language is the limits of my world so helpful in connecting to herself and to others to build a vocabulary.  Noted for patient what is helpful is cathartic experience where she shares an has a relief.  Processed patient's feelings and provided active listening open questions supportive interventions. Suicidal/Homicidal: No  Plan: Return again in 2 weeks.2.Work with patient on stress management, coping provide supportive interventions  Diagnosis: Axis I:  Major Depressive Disorder, recurrent, moderate (history of bipolar) Generalized Anxiety Disorder    Axis II: No diagnosis    Coolidge Breeze, LCSW 06/12/2020

## 2020-06-26 ENCOUNTER — Ambulatory Visit (INDEPENDENT_AMBULATORY_CARE_PROVIDER_SITE_OTHER): Payer: Medicare Other | Admitting: Licensed Clinical Social Worker

## 2020-06-26 DIAGNOSIS — F331 Major depressive disorder, recurrent, moderate: Secondary | ICD-10-CM | POA: Diagnosis not present

## 2020-06-26 DIAGNOSIS — F411 Generalized anxiety disorder: Secondary | ICD-10-CM | POA: Diagnosis not present

## 2020-06-26 NOTE — Progress Notes (Signed)
Virtual Visit via Video Note  I connected with Diane Pacheco on 06/26/20 at  9:00 AM EST by a video enabled telemedicine application and verified that I am speaking with the correct person using two identifiers.  Location: Patient: stationary car Provider: home office   I discussed the limitations of evaluation and management by telemedicine and the availability of in person appointments. The patient expressed understanding and agreed to proceed.   I discussed the assessment and treatment plan with the patient. The patient was provided an opportunity to ask questions and all were answered. The patient agreed with the plan and demonstrated an understanding of the instructions.   The patient was advised to call back or seek an in-person evaluation if the symptoms worsen or if the condition fails to improve as anticipated.  I provided 54 minutes of non-face-to-face time during this encounter.  THERAPIST PROGRESS NOTE  Session Time: 9:00 AM to 9:54 AM  Participation Level: Active  Behavioral Response: CasualAlertappropriate  Type of Therapy: Individual Therapy  Treatment Goals addressed:  stress management, supportive interventions to help patient be caretaker for family members, emotional regulation for anxiety, anger, depression, strategies to help her in caretaking that will help with stress  Interventions: Solution Focused, Strength-based, Supportive and Other: coping  Summary: Diane Pacheco is a 50 y.o. female who presents with Molly Maduro mom's husband having trouble breathing after last visit has punctured lung thought lung was deflated, tested positive for COVID, he had not fever and no cough. Gave him a breathing treatment. He wants to come stay with them even though he has never liked being at their house because dark and cold. He is not hard to take care of but extra person to work around. Would help take mom to bathroom and playroom. Going to come over stay a day and see  how that goes. He had a stroke and memory going, aphasia afraid to leave alone if he comes. Before she got sick, mom and her husband seperated for years. After stroke he was the nicest guy. She moved in to take care of him until her health declined and came and stayed with patient. Patient realizes the only one losing out is her. Diane Pacheco is mad lately because all the demands put on patient so she is tired and wore out. Patient when tired snaps at everyone. Upsets him because he wants her to be good. He has seen her break down and go to the hospital and afraid that is going to happen again. Never allowed wake up on her own terms and can't sleep until she is done. This is every day. No day to sleep in and gets. 5-6 hours a night. Diane Pacheco blames it on her Mom and he asks as much as her and doesn't see that. Doesn't say anything let it roll. Once in awhile will say it is not just her.  Therapist encourage patient to notice signs things are getting worse and to implement self-care as therapeutic interventions include as well making sure patient does not spiral in making sure mental health is managed utilizing therapy as additional too for this.. Patient shares ways she implements self-care. A few days a week don't do dishes. Reviewed other stressors Bear had appointments got so lucky son didn't have college classes. Home health still not helping, patient thinks could be low staffed. Over being frustrated. Good with letting go. Very little bother her.  In terms of Molly Maduro coming "We will get around it." Reviewed patient symptoms right  now. Not too much stress. Mom is doing pretty good, oxygen has been stable.   Therapist reviewed symptoms, facilitated expression of thoughts and feelings use this is one of the main treatment interventions to help patient continue to manage stressors as caretaker, noted positive of a relatively stable time in care of her mom to focus on positive.  Noted possible addition of another stressor with  mom's husband possibly coming to stay with them, noted patient's attitude that helps with coping including not letting this disrupt her attitude too much, sharing that they will figured out.  Therapist assesses helpful to continue to provide supportive and strength-based intervention as patient takes on new stressors which hopefully are too disruptive and she can manage.  Therapist provided active listening, strength-based encouraging patient to stay focused on her resources and resiliency, active listening open questions. Suicidal/Homicidal: No  Plan: Return again in 2 weeks.2.Work with patient on stress management, coping provide supportive interventions  Diagnosis: Axis I:  Major Depressive Disorder, recurrent, moderate (history of bipolar) Generalized Anxiety Disorder    Axis II: No diagnosis    Coolidge Breeze, LCSW 06/26/2020

## 2020-07-10 ENCOUNTER — Ambulatory Visit (INDEPENDENT_AMBULATORY_CARE_PROVIDER_SITE_OTHER): Payer: Medicare Other | Admitting: Licensed Clinical Social Worker

## 2020-07-10 DIAGNOSIS — F411 Generalized anxiety disorder: Secondary | ICD-10-CM

## 2020-07-10 DIAGNOSIS — F331 Major depressive disorder, recurrent, moderate: Secondary | ICD-10-CM

## 2020-07-10 NOTE — Progress Notes (Signed)
Virtual Visit via Telephone Note  I connected with Diane Pacheco on 07/10/20 at  9:00 AM EDT by telephone and verified that I am speaking with the correct person using two identifiers.  Location: Patient: in stationary car Provider: home office   I discussed the limitations, risks, security and privacy concerns of performing an evaluation and management service by telephone and the availability of in person appointments. I also discussed with the patient that there may be a patient responsible charge related to this service. The patient expressed understanding and agreed to proceed.   I discussed the assessment and treatment plan with the patient. The patient was provided an opportunity to ask questions and all were answered. The patient agreed with the plan and demonstrated an understanding of the instructions.   The patient was advised to call back or seek an in-person evaluation if the symptoms worsen or if the condition fails to improve as anticipated.  I provided 53 minutes of non-face-to-face time during this encounter.  THERAPIST PROGRESS NOTE  Session Time: 9:00 AM to 9:53 AM  Participation Level: Active  Behavioral Response: CasualAlertAnxious and Dysphoric  Type of Therapy: Individual Therapy  Treatment Goals addressed:  stress management, supportive interventions to help patient be caretaker for family members, emotional regulation for anxiety, anger, depression, strategies to help her in caretaking that will help with stress  Interventions: CBT, Solution Focused, Strength-based, Supportive and Other: coping  Summary: Diane Pacheco is a 50 y.o. female who presents with bad day yesterday and didn't get any sleep. Started on Tuesday on her Mom's birthday, Mom's oxygen day before yesterday was getting lower was "slurry", yesterday tanked and was a very very bad day, put her on bed, bad day oxygen levels were in the 60's. Almost called hospital two times. Spent most  of the day crying. Then get mad at her. She wants a cigarette. Mom is going to have something that is going to kill her. Patient recognizes though If she is going to die let her die happy and not take away things that make her happy. Scary part one of these times don't know if can fix it. Technically she shouldn't have made it yesterday. There isn't much she can do to fix it which is the difficult part. "What kills me is checking the numbers knowing can't do anything about and is creating a lot of anxiety and doesn't help her." [name] did a wonderful job to comfort her didn't get upset about anything. "It's a miracle." Explored what happened. It was her birthday and all she got was phone calls and Orlie Pollen came over. Notice when taking phone calls out of breath. COPD is unpredictable not normal progression of an illness. Oxygen level is better. Wants her in between 87-90. Can't raise oxygen with people with COPD if do more dangerous they think they don't think they need a deep breath. When up her oxygen is fine but sleeping weird breathing pattern shallow not enough for gas exchange oxygen and CO2. On Trilogy external respirator helps her to take a deep breath. Has saved her life. Loves respiratory therapist came to see if anything she could do during lunch break. Told her reason scared might not be able to fix it. Helps because she relates she had a brother die of COVID. She advocates for her and doesn't just give medicine. Feels some relief today. Bear bought her dinner, oxygen was holding pretty good last night. Making spaghetti and meatballs tonight and wants one meal to  eat something he enjoys as he hasn't been eating anything because on a diet. Yesterday was spinning in circles and today can do dishes. Last night doing better but was afraid to put her bed when oxygen drops. Discussed what she can say to herself when anxious and exhausted and patient said what helped was exhausted and just fell asleep. Head on  overload, chest hurting-day long panic, helpful when brain shut down. Discussed what she can do for panic. At one point in the day didn't look at numbers, made a corn dog and looked at news. Updated about Molly Maduro and he isn't going to stay at her place. He stayed for an hour and then left so not going to happen for him to stay with her. .     Therapist reviewed symptoms, facilitated expression of thoughts and feelings and utilize this as significant treatment intervention as patient thought she almost lost her mom yesterday.  Validated her on how scary that was.  Difficulty because it is like a roller coaster with unpredictable course.  Reframed in terms of bouncing back and enjoying the moments of relief that she experienced last night and today in making the most of them.  Encourage patient to notice when she is using good coping identified that with her narrative helps to notice it because and encourages her to do more of it and gives herself credit for that.  Discussed self statements that could help manage anxiety and it seems patient just being exhausted and shutting her brain down was the most helpful although she did use distraction when looking at the news.  Reviewed what would help with panic such as deep breathing, and it appears distraction was helpful and eating something.  Noted humor is helpful in patient's coping.  Therapist provided active listening, open questions, supportive interventions. Suicidal/Homicidal: No  Plan: Return again in 2 weeks.2.Work with patient on stress management, coping provide supportive interventions  Diagnosis: Axis I: Major Depressive Disorder, recurrent, moderate (history of bipolar) Generalized Anxiety Disorder    Axis II: No diagnosis    Coolidge Breeze, LCSW 07/10/2020

## 2020-07-24 ENCOUNTER — Ambulatory Visit (INDEPENDENT_AMBULATORY_CARE_PROVIDER_SITE_OTHER): Payer: Medicare Other | Admitting: Licensed Clinical Social Worker

## 2020-07-24 DIAGNOSIS — F411 Generalized anxiety disorder: Secondary | ICD-10-CM | POA: Diagnosis not present

## 2020-07-24 DIAGNOSIS — F331 Major depressive disorder, recurrent, moderate: Secondary | ICD-10-CM | POA: Diagnosis not present

## 2020-07-24 NOTE — Progress Notes (Signed)
Virtual Visit via Video Note  I connected with Diane Pacheco on 07/24/20 at  8:00 AM EDT by a video enabled telemedicine application and verified that I am speaking with the correct person using two identifiers.  Location: Patient: stationary car Provider: home office    I discussed the limitations of evaluation and management by telemedicine and the availability of in person appointments. The patient expressed understanding and agreed to proceed.   I discussed the assessment and treatment plan with the patient. The patient was provided an opportunity to ask questions and all were answered. The patient agreed with the plan and demonstrated an understanding of the instructions.   The patient was advised to call back or seek an in-person evaluation if the symptoms worsen or if the condition fails to improve as anticipated.  I provided 53 minutes of non-face-to-face time during this encounter.  THERAPIST PROGRESS NOTE  Session Time: 8:00 AM to 8:53 AM  Participation Level: Active  Behavioral Response: CasualAlertAnxious and Irritable  Type of Therapy: Individual Therapy  Treatment Goals addressed:  stress management, supportive interventions to help patient be caretaker for family members, emotional regulation for anxiety, anger, depression, strategies to help her in caretaking that will help with stress  Interventions: Solution Focused, Strength-based, Supportive, Anger Management Training and Other: coping  Summary: Diane Pacheco is a 51 y.o. female who presents with mom's oxygen has been great when put on prednisone, until yesterday was sick and throwing up and oxygen at 70. Maybe up her dose of prednisone. Patient has been mad at the entire world. [name] has been waking up earlier having a hard time sleeping. Soon as wake up has to do things for mom. When [name] gets up early he needs her to do a bunch of stuff for him. Even if wake up from nap hears "I need" Can't give her 10  minutes to get herself together before demanding something. Ping pong between the two for an hour. There is no fixing it because they need it. Explored ways they can have more patience and patient says not going to work because when they ask they need it. They feel bad for asking her because patient gets irritable. Breaths and take a minute she tries. Rule can't be in play room because of risk of mom unplugging the oxygen when patient not there and came back she was there.  Therapist added to sets to frustration of doing things for them and then not doing what she asked some patients agrees. In session wwhat she needs is that it helps the venting and validating. Know they feel bad for what they do. Talked about little things being big things. Mom saw Toy Story something she is always wanted to see and thought it was clever. Bear bought pizza saw she was irritated and tired. If that is what he can give then take it and being helpful in his way. Managing her irritability being straight forward always been hard for her, likes everyone happy to her detriment.  And working with patient therapist sees at 2 and continues to encourage her to speak up for her needs.  Tries to do it in a way that is funny. Talked about happy memories with Dad going to plays and museums. Right now can't get out but doesn't feel closed in has to go to grocery store. Goes every day to get out. In terms of irritability has outlets she enjoys like video games.   Therapist reviewed symptoms, facilitated expression on  thoughts and feeling and worked on issues with irritability noting patient trying to practice strategies of coping including taking a minute pausing in breathing, trying to be more straightforward although difficult for her trying to say in a nice ways expressing her needs. Discussed helpfulness of therapy to validate how she is feeling and helping of venting.  Discussed at this point with mom continue to do what she is doing to make  the most of time with her and that even in the small things can be special moments, that is with everybody in life, noted some of the things patient does with her that are special.  Utilize distraction techniques in session to help patient think of things such as happy memories things that are funny, show she is enjoying that are good outlets for her.  Noted humor is helpful to.  Continue to provide active listening open questions supportive interventions        Suicidal/Homicidal: No  Plan: Return again in 2 weeks.2.Work with patient on stress management, coping provide supportive interventions, work on irritability and anxiety  Diagnosis: Axis I:  Major Depressive Disorder, recurrent, moderate (history of bipolar) Generalized Anxiety Disorder    Axis II: No diagnosis    Coolidge Breeze, LCSW 07/24/2020

## 2020-08-07 ENCOUNTER — Other Ambulatory Visit: Payer: Self-pay

## 2020-08-07 ENCOUNTER — Ambulatory Visit (HOSPITAL_COMMUNITY): Payer: Medicare Other | Admitting: Licensed Clinical Social Worker

## 2020-08-07 DIAGNOSIS — F411 Generalized anxiety disorder: Secondary | ICD-10-CM

## 2020-08-07 DIAGNOSIS — F331 Major depressive disorder, recurrent, moderate: Secondary | ICD-10-CM

## 2020-08-07 NOTE — Progress Notes (Signed)
Therapist got on visit with My Chart and patient did not respond. Later received message that she was waiting for visit. Not sure when she got on call but cancel visit with no charge.

## 2020-08-21 ENCOUNTER — Ambulatory Visit (HOSPITAL_COMMUNITY): Payer: Medicare Other | Admitting: Licensed Clinical Social Worker

## 2020-08-21 ENCOUNTER — Telehealth (HOSPITAL_COMMUNITY): Payer: Self-pay

## 2020-08-21 NOTE — Telephone Encounter (Signed)
Canceled appt today due to you being out sick. Patient says she really needs to talk to you because her mom passed. She doesn't have another appt until 5/19

## 2020-08-22 NOTE — Telephone Encounter (Signed)
Process through her feelings of grief with patient. Noted she is managing pretty well. Set up two more appointments

## 2020-09-04 ENCOUNTER — Ambulatory Visit (INDEPENDENT_AMBULATORY_CARE_PROVIDER_SITE_OTHER): Payer: Medicare Other | Admitting: Licensed Clinical Social Worker

## 2020-09-04 DIAGNOSIS — F411 Generalized anxiety disorder: Secondary | ICD-10-CM

## 2020-09-04 DIAGNOSIS — F331 Major depressive disorder, recurrent, moderate: Secondary | ICD-10-CM | POA: Diagnosis not present

## 2020-09-04 NOTE — Progress Notes (Signed)
Virtual Visit via Video Note  I connected with [name] on 09/04/20 at  9:00 AM EDT by a video enabled telemedicine application and verified that I am speaking with the correct person using two identifiers.  Location: Patient: sitting in stationary car Provider: home office   I discussed the limitations of evaluation and management by telemedicine and the availability of in person appointments. The patient expressed understanding and agreed to proceed.   I discussed the assessment and treatment plan with the patient. The patient was provided an opportunity to ask questions and all were answered. The patient agreed with the plan and demonstrated an understanding of the instructions.   The patient was advised to call back or seek an in-person evaluation if the symptoms worsen or if the condition fails to improve as anticipated.  I provided 53 minutes of non-face-to-face time during this encounter.  THERAPIST PROGRESS NOTE  Session Time: 9:00 AM to 9:53 AM  Participation Level: Active  Behavioral Response: CasualAlertDepressed and Irritable  Type of Therapy: Individual Therapy  Treatment Goals addressed:  stress management, supportive interventions to help patient be caretaker for family members, emotional regulation for anxiety, anger, depression, strategies to help her in caretaking that will help with stress  Interventions: Solution Focused, Strength-based, Supportive and Other: coping, grief  Summary: MARVA HENDRYX is a 50 y.o. female who presents with seems not happy with anything especially Bear. He is trying to do everything to make her happy and that makes her more angry. He thinks she knows what she is feeling. He is saying she is sad, been there and lost Dad and knows how it  feels. He does it to be nice, He is trying in his weird way. Nobody could say anything and it would be right. He does know how she feels but doesn't need to tell her. He is trying to give  her space to do what she does. Picked up mom's ashes. Stopped at convenience store asked if she ok. They said sorry for her loss. Lady followed her out and said I understand how you are feeling, give her a hug, just lost her mom. Patient broke down and she broke down and patient said it was nice.. Mom memorial this Saturday. Went back and told her thank you. Not going to pay price at funeral home for urn. Extra vacation this year. [name] taking them to [facility]. Trying to figure out how to stay in the house and not move. Taking care of [name]. Asked him about it and if he can continue to pay so can have money for house. Sure he will help but still working on it. If not son and dad will pitch in doesn't know how long could sustain it. When [name] gets money he has a comment not sure if they are going to make it work upsets patient. Patient realizes he worries.  Acknowledges right now all she can do is go with the flow.  Because uncertainty of life and not having control of a lot of things  So coping is have faith and hope things work out. She has a couple of vacations in September. House is way too quiet. Worst part is the first in the morning. Gets up at 9 AM. Used to be getting mom taking care of her. Playing games to just have voices and noise to block the negative space. Worry about brother hard time with it and at [facility] didn't say a lot. Didn't spend time with her.  He has a full time job.  Patient shares now go to regrets based on the care she gave her mom and being there with her.  Patient is in depression in terms of her grief. Today having a [employer] day and watching a movies. Talked about watching movies helps with coping. Mom loved crime stories and at the end watching gory crime stores. Patient watching every possible cooking show now has control over the television.  Therapist encouraged her with these positive activities      Therapist reviewed symptoms, facilitated expression of thoughts and  feeling helping patient to process feelings to help with grief process.  Discussed process of grief in general so patient has more insight that also will help her with coping.  Discussed some of the feelings that come with grief include anger, depression, bargaining denial talked about the eventual meeting making of grief that helps but still have to go through the process of feeling different feelings because when you heal you feel.  Also discussed the dual process model knowing at first she focus on the loss but with time you can focus more on restoration and when you look at the loss you are able to leave it when you need to.  Noted patient's anger toward her partner and verbalized that she is dealing with loss and directing it toward him note that what is underlying will help her in managing how she interacts.  Validated at the same time not wanting other people to tell us how we feel not helpful.  Noted distraction is helpful.  Noted allowing herself to not be coping well at these times because of the burden we have dealing with loss and the pain we feel give ourselves space to not always be doing her best.  Noted helpfulness of woman who reached out to her who also lost mom how these moments help [place] with her grief.  Therapist provided active listening open questions supportive interventions Suicidal/Homicidal: No  Plan: Return again in 3 weeks.2.  Work with patient on processing feelings, grief, coping  Diagnosis: Axis I:   Major Depressive Disorder, recurrent, moderate (history of bipolar) Generalized Anxiety Disorder    Axis II: No diagnosis    Coolidge Breeze, LCSW 09/04/2020

## 2020-09-24 DIAGNOSIS — U071 COVID-19: Secondary | ICD-10-CM | POA: Diagnosis not present

## 2020-10-02 ENCOUNTER — Ambulatory Visit (HOSPITAL_COMMUNITY): Payer: Medicare Other | Admitting: Licensed Clinical Social Worker

## 2020-10-02 DIAGNOSIS — F411 Generalized anxiety disorder: Secondary | ICD-10-CM

## 2020-10-02 DIAGNOSIS — F331 Major depressive disorder, recurrent, moderate: Secondary | ICD-10-CM

## 2020-10-02 NOTE — Progress Notes (Signed)
Virtual Visit via Telephone Note  I connected with Diane Pacheco on 10/02/20 at  9:00 AM EDT by telephone and verified that I am speaking with the correct person using two identifiers.  Location: Patient: partner's mom's house Provider: home office   I discussed the limitations, risks, security and privacy concerns of performing an evaluation and management service by telephone and the availability of in person appointments. I also discussed with the patient that there may be a patient responsible charge related to this service. The patient expressed understanding and agreed to proceed.   I discussed the assessment and treatment plan with the patient. The patient was provided an opportunity to ask questions and all were answered. The patient agreed with the plan and demonstrated an understanding of the instructions.   The patient was advised to call back or seek an in-person evaluation if the symptoms worsen or if the condition fails to improve as anticipated.  I provided 53 minutes of non-face-to-face time during this encounter.  THERAPIST PROGRESS NOTE  Session Time: 9:00 AM to 9:53 AM  Participation Level: Active  Behavioral Response: CasualAlertDepressed tsress management, supportive interventions to help patient be caretaker for family members, emotional regulation for anxiety, anger, depression, strategies to help her in caretaking that will help with stress  Type of Therapy: Individual Therapy  Treatment Goals addressed:  sress management, supportive interventions to help patient be caretaker for family members, emotional regulation for anxiety, anger, depression, strategies to help her in caretaking that will help with stress Interventions: Solution Focused, Strength-based, Supportive, and Other: grief coping  Summary: Diane Pacheco is a 50 y.o. female who presents with at [facility] and nice change. [place]. Loves the bed. They have a room that stayed lock  when gone. When asked what was going on she became tearful and said she sabotages everything. Haven't told [employer] expired but when Mom passing didn't fill out the paperwork. Therapist normalized this. Need money for the food but can't bring herself to to it. Should have gotten it done since then don't have the energy or frame of mind to go social security don't care but need it. Discussed grief being a factor. She talks to mom like she he is here. Reviewed handout on grief and patient sad future good that same as every other day keep rolling. Relates helps to be there not constant reminders. Home and then back for week. Enjoy the part can relax and do what she wants to do but sad that had to happen. Grateful that she took her to her house would have passed sooner if not. Believes that got to enjoy herself. Not have to worry about [name] instead of her taking care of him patient able to take care of her she deserved it.  Therapist reviewed motivating to get to social security and patient said says will get to it. [name] gives her money when he remembers go down and $100 when go so have to go down 8 times. Taking care of him. Youngest in love her son. He is 20. Her girlfriend a Equities trader and very Christian. Met because in same gaming group. Watching Vikings. Cooking. Don't have to clean as much lay in the bed and watch TV. Still catching up on sleep. Feels good to have rest. Daughter went back to being blond. She is working at [employer] that is a big deal with her. Able to get out and work-23. Alroy Dust is doing good.   Therapist  reviewed symptoms, facilitated expression of thoughts and feelings worked on grief issues by processing patient's feelings provided perspective on how she was doing that these experiences all felt the context of grief so to give herself a break for example feeling lack of motivation to do things she needs to do.  Normalizing experience feel helpful for patient.  Reviewed handout on  grief and explored with patient significant aspect as processing feelings in sessions noted patient not upset about the future noted this is positive also noted that she is relaxing and that is positive utilize that is reframing.  Noted that she can relax like she couldn't before and noticed think she is enjoying it as a way to enhance mood such as cooking and watching television, also noted separating from the house helps to so not as easily triggered now that she is staying at [employer].  Reviewed future oriented type focus talking about her kids.  Noted no reason for patient to feel guilt and she does not that she was there for mom and also patient acknowledges things mom enjoyed with all the things patient did for her.  Talked about ways to find connection noted patient talking to her about the pros and cons of that way to find connection but also still at a point hard to let go.  Therapist provided active listening, open questions, supportive interventions Suicidal/Homicidal: No  Plan: Return again in 2 weeks.1.  Work on grief, coping  Diagnosis: Axis I: Major Depressive Disorder, recurrent, moderate (history of bipolar) Generalized Anxiety Disorder    Axis II: No diagnosis    Cordella Register, LCSW 10/02/2020

## 2020-10-16 ENCOUNTER — Ambulatory Visit (INDEPENDENT_AMBULATORY_CARE_PROVIDER_SITE_OTHER): Payer: Medicare Other | Admitting: Licensed Clinical Social Worker

## 2020-10-16 DIAGNOSIS — F331 Major depressive disorder, recurrent, moderate: Secondary | ICD-10-CM | POA: Diagnosis not present

## 2020-10-16 DIAGNOSIS — F411 Generalized anxiety disorder: Secondary | ICD-10-CM | POA: Diagnosis not present

## 2020-10-16 NOTE — Progress Notes (Signed)
Virtual Visit via Video Note  I connected with [name] on 10/16/20 at 10:00 AM EDT by a video enabled telemedicine application and verified that I am speaking with the correct person using two identifiers.  Location: Patient: outside partner's mom's house Provider: home office   I discussed the limitations of evaluation and management by telemedicine and the availability of in person appointments. The patient expressed understanding and agreed to proceed.    I discussed the assessment and treatment plan with the patient. The patient was provided an opportunity to ask questions and all were answered. The patient agreed with the plan and demonstrated an understanding of the instructions.   The patient was advised to call back or seek an in-person evaluation if the symptoms worsen or if the condition fails to improve as anticipated.  I provided 53 minutes of non-face-to-face time during this encounter.  THERAPIST PROGRESS NOTE  Session Time: 10:00 AM to 10:53 AM  Participation Level: Active  Behavioral Response: CasualAlertDysphoric  Type of Therapy: Individual Therapy  Treatment Goals addressed:  sress management, supportive interventions to help patient be caretaker for family members, emotional regulation for anxiety, anger, depression, strategies to help her in caretaking that will help with stress Interventions: Solution Focused, Strength-based, Supportive, and Other: grief, coping  Summary: LAURIEANN FRIDDLE is a 50 y.o. female who presents with at [name]'s house and does like going there, like mini vacation but this time getting work done for the house. Going back and forth week to week between mom's house and their house. Mood is alright. She gets weepy people look at her and cry. It is normal for her not just because of grief had it before.  Sometimes get in moods where weepy and mom had it too. Has to fix lawnmower really good at fixing things. Anniversary of  [name] dad's death 15 years ago was tough for him and [place]. Discussed losses can surface and be painful even after years especially around anniversaries. Patient just crampy and weepy not particularly about mom. Explored further her grief and she said she passed the buck on it this weekend needed to get urn out to family and couldn't bring herself to open the box. [name], her son, is going to do it for her. Nice to have someone to take the burden. Did get food stamps. Too late to do re-certification had to reapply. Got emergency food stamps should take about 20 days. Talking more with [name] since Mom came to her house now comes to the house and talks on phone for hour.  Discussed how you can develop a spatial relationships without connection and it will be very gratifying patient agrees  Therapist reviewed symptoms, facilitated expression of thoughts and feelings as important intervention related to grief issues, other stressors.  Therapist provided metaphors to help patient have understanding to help cope with grief such as looking at grief as a wound another 1 has to do with how you expand around the grief therapist noted this seems accurate way to describe it.  Noted the supports patient has helping her when she needs them with tasks around grieve is very helpful.  Provided positive feedback for patient taking care of Social Security, note it as a sign of better functioning.  Talked about gratitude, as therapist described "noting the beauty in the cracks" despite what is going on to pay attention to beauty around this.  Therapist provided active listening, open questions, supportive interventions Suicidal/Homicidal: No  Plan: Return again in  1 week.2.  Work with patient on grief, stress management, coping  Diagnosis: Axis I: Major Depressive Disorder, recurrent, moderate (history of bipolar) Generalized Anxiety Disorder    Axis II: No diagnosis    Coolidge Breeze, LCSW 10/16/2020

## 2020-10-17 ENCOUNTER — Other Ambulatory Visit: Payer: Self-pay

## 2020-10-17 DIAGNOSIS — J449 Chronic obstructive pulmonary disease, unspecified: Secondary | ICD-10-CM

## 2020-10-17 MED ORDER — ALBUTEROL SULFATE HFA 108 (90 BASE) MCG/ACT IN AERS: 1.0000 | INHALATION_SPRAY | Freq: Four times a day (QID) | RESPIRATORY_TRACT | 0 refills | Status: DC | PRN

## 2020-10-23 ENCOUNTER — Ambulatory Visit (INDEPENDENT_AMBULATORY_CARE_PROVIDER_SITE_OTHER): Payer: Medicare Other | Admitting: Licensed Clinical Social Worker

## 2020-10-23 DIAGNOSIS — F411 Generalized anxiety disorder: Secondary | ICD-10-CM

## 2020-10-23 DIAGNOSIS — F331 Major depressive disorder, recurrent, moderate: Secondary | ICD-10-CM | POA: Diagnosis not present

## 2020-10-23 NOTE — Progress Notes (Signed)
Virtual Visit via Video Note  I connected with Diane Pacheco on 10/23/20 at 10:00 AM EDT by a video enabled telemedicine application and verified that I am speaking with the correct person using two identifiers.  Location: Patient: home Provider: home office   I discussed the limitations of evaluation and management by telemedicine and the availability of in person appointments. The patient expressed understanding and agreed to proceed.   I discussed the assessment and treatment plan with the patient. The patient was provided an opportunity to ask questions and all were answered. The patient agreed with the plan and demonstrated an understanding of the instructions.   The patient was advised to call back or seek an in-person evaluation if the symptoms worsen or if the condition fails to improve as anticipated.  I provided 16 minutes of non-face-to-face time during this encounter.  THERAPIST PROGRESS NOTE  Session Time: 10:00 AM to 10:16 AM  Participation Level: Active  Behavioral Response: CasualAlertnot feeling well sick  Type of Therapy: Individual Therapy  Treatment Goals addressed:  stress management, supportive interventions to help patient be caretaker for family members, emotional regulation for anxiety, anger, depression, strategies to help her in caretaking that will help with stress Interventions: Solution Focused, Strength-based, and Supportive  Summary: Diane Pacheco is a 50 y.o. female who presents with sick and bad sore throat, ear ache, not COVID. Youngest son, Diane Pacheco, truck broke down. Will give him their truck which they only use once a year. He has always been looking at their truck big [facility] 4x4 four wheel drive. He needs it to get to [employer] to be a Public relations account executive. He is an Glass blower/designer, Chief Strategy Officer.  Therapist talked about how impressively he was certified in all these different areas.  Her partner Diane Pacheco is excited to be able to do something for  their son.  Noted keeping the session short as patient really was not feeling well, did talk about her coping skills was related to watching show she enjoys in this case a new show titled "stranger things".  Therapist supported patient in taking care of her self while she is sick as well as active listening, open questions, supportive interventions.  Suicidal/Homicidal: No  Plan: Return again in 3 weeks.2. Work with patient on grief, stress management, coping  Diagnosis: Axis I: Major Depressive Disorder, recurrent, moderate (history of bipolar) Generalized Anxiety Disorder     Axis II: No diagnosis    Coolidge Breeze, LCSW 10/23/2020

## 2020-10-27 ENCOUNTER — Ambulatory Visit (INDEPENDENT_AMBULATORY_CARE_PROVIDER_SITE_OTHER): Payer: Medicare Other | Admitting: Family Medicine

## 2020-10-27 ENCOUNTER — Other Ambulatory Visit: Payer: Self-pay

## 2020-10-27 VITALS — BP 145/83 | HR 89 | Temp 99.1°F | Ht 65.0 in

## 2020-10-27 DIAGNOSIS — J029 Acute pharyngitis, unspecified: Secondary | ICD-10-CM | POA: Diagnosis not present

## 2020-10-27 DIAGNOSIS — J449 Chronic obstructive pulmonary disease, unspecified: Secondary | ICD-10-CM | POA: Diagnosis not present

## 2020-10-27 MED ORDER — AMOXICILLIN 875 MG PO TABS: 875.0000 mg | ORAL_TABLET | Freq: Two times a day (BID) | ORAL | 0 refills | Status: AC

## 2020-10-27 MED ORDER — ALBUTEROL SULFATE HFA 108 (90 BASE) MCG/ACT IN AERS: 1.0000 | INHALATION_SPRAY | Freq: Four times a day (QID) | RESPIRATORY_TRACT | 2 refills | Status: DC | PRN

## 2020-10-27 NOTE — Progress Notes (Signed)
   SUBJECTIVE:   CHIEF COMPLAINT / HPI:      Diane Pacheco is a 50 y.o. female here for hoarseness and ear pain started about a one to two weeks ago. Does not hurt to swallow. No one else has similar sx. Had 2 home negative COVID tests (4 days and 2 days ago). Tried a throat spray without relief. Denies fevers, rhinorrhea, myalgias, shortness of breath, chest pain, wheezing, changes in vision, changes in taste and smell. COVID vaccinated and boosted. Smokes 1/2 ppd. Has chronic cough related to COPD that is unchanged.    PERTINENT  PMH / PSH: reviewed and updated as appropriate   OBJECTIVE:   BP (!) 145/83   Pulse 89   Temp 99.1 F (37.3 C) (Oral)   Ht 5\' 5"  (1.651 m)   SpO2 99%   BMI 27.79 kg/m    GEN: pleasant, in no acute distress HEENT:  no scleral injection, mucus membranes moist, oropharyngeal without lesions, erythema present,  no exudate, nares patent, no nasal discharge, edentulous  NECK:  supple, normal ROM, no lymphadenopathy CV: regular rate and rhythm, no murmur RESP: no increased work of breathing, clear to ascultation bilaterally with no crackles, wheezes, or rhonchi  SKIN: warm, dry     ASSESSMENT/PLAN:   No problem-specific Assessment & Plan notes found for this encounter.   Pharyngitis  Patient with possible viral turned bacterial pharyngitis given hx of slight improvement but getting worse again over the past two weeks.  Treat with amoxicillin BID for 10 days.   , DO PGY-2,  Family Medicine 10/27/2020

## 2020-10-27 NOTE — Patient Instructions (Addendum)
It was great seeing you today!   I'd like to see you back 1 week for re-evaluation if your symptoms do not improve. Stop by the pharmacy to pick up your prescriptions.  If you have questions or concerns please do not hesitate to call at [phone].  Dr. K[facility]

## 2020-11-12 ENCOUNTER — Ambulatory Visit (HOSPITAL_COMMUNITY): Payer: Medicare Other | Admitting: Licensed Clinical Social Worker

## 2020-11-20 DIAGNOSIS — Z23 Encounter for immunization: Secondary | ICD-10-CM | POA: Diagnosis not present

## 2020-11-23 DIAGNOSIS — U071 COVID-19: Secondary | ICD-10-CM | POA: Diagnosis not present

## 2020-11-27 ENCOUNTER — Ambulatory Visit (INDEPENDENT_AMBULATORY_CARE_PROVIDER_SITE_OTHER): Payer: Medicare Other | Admitting: Licensed Clinical Social Worker

## 2020-11-27 DIAGNOSIS — F331 Major depressive disorder, recurrent, moderate: Secondary | ICD-10-CM | POA: Diagnosis not present

## 2020-11-27 DIAGNOSIS — F411 Generalized anxiety disorder: Secondary | ICD-10-CM | POA: Diagnosis not present

## 2020-11-27 NOTE — Progress Notes (Addendum)
Virtual Visit via Video Note  I connected with Diane Pacheco on 11/27/20 at 10:00 AM EDT by a video enabled telemedicine application and verified that I am speaking with the correct person using two identifiers.  Location: Patient: stationary car at home Provider: home office   I discussed the limitations of evaluation and management by telemedicine and the availability of in person appointments. The patient expressed understanding and agreed to proceed.   I discussed the assessment and treatment plan with the patient. The patient was provided an opportunity to ask questions and all were answered. The patient agreed with the plan and demonstrated an understanding of the instructions.   The patient was advised to call back or seek an in-person evaluation if the symptoms worsen or if the condition fails to improve as anticipated.  I provided 54 minutes of non-face-to-face time during this encounter.  THERAPIST PROGRESS NOTE  Session Time: 10:00 AM to 10:54 AM  Participation Level: Active  Behavioral Response: CasualAlertAnxious and Euthymic  Type of Therapy: Individual Therapy  Treatment Goals addressed:  stress management, supportive interventions to help patient be caretaker for family members, emotional regulation for anxiety, anger, depression, strategies to help her in caretaking that will help with stress Interventions: CBT, Solution Focused, Strength-based, Supportive, Reframing, and Other: coping  Summary: Diane Pacheco is a 50 y.o. female who presents with good news that she is going to mountains in [place] from Saturday to Thursday.  Makes her nervous because it has been 6 to 7 years that she has been away from [place]. He suggested it a few days where didn't have to take care of anything but her. Will be going with his Mom and she likes shows doesn't have to plan things along for the ride. Likes the mountains better than the beach. Two weeks later at the beach. Back  to the beach in April. His Mom bought another time share for them. Major stressor is money right now. Figure how long can stay where they are without going into the fund too much. Will be probably end up having to end up living with Mom, brother and brother's daughter. They already live in living room. Mom is shopaholic and hoarder. "Some things just are." Put a building out there to put a pool table in. Right now nervousness. In general crying less for her a sign of progress in treatment.  Anxious because he knows how to help and intervene before her symptoms escalates.  Feels she is awkward in social situations, she gets very anxious, like panic attack kind of anxious. Worry have a panic attack in front of Mom. Scratch and does it until bleed, bite herself before but don't have teeth. As a teenager bite her nails but when pregant she told herself can't put nails in mouth they are dirty. What she does is sit on hands and it passes. Talked about day to day does cleaning watch shows with Bear and running. Don't sit down much.   Therapist reviewed symptoms, facilitated expression of thoughts and feelings and reviewed treatment plan and patient gave consent to complete virtually.  Noted the support of therapy and expression of feelings helps her in coping so therapy helpful for her.  Reviewed other goals and patient says to continue with goals as caretaker since she still looks after bear and still therapy to address emotional regulation for different emotions.  Work with patient today on anxiety management noting decreasing significance of events that cause her anxiety helpful, not caring  what other people think that is not significant for her social anxiety.  Explored self talk that could be helpful noted patient's own coping that she puts her hands under her so she does not scratch one of the negative coping strategy she has and therapist provided positive feedback for using this.  Noted improvement in symptoms  provided positive feedback for patient taking more breaks that will be helpful for mental health.  Explored day today and noted positive activities that help with mood such as watching favorite shows.  Processed feelings related to stressors most significantly money and provided positive feedback for patient's understanding that some things just are.  Also ways to alleviate stress thinking of building a building if they do go to [employer].  Therapist provided active listening, open questions, supportive interventions. Suicidal/Homicidal: Patient reports feeling like not wanting to wake up has felt like this since October but would not do anything to harm herself  Plan: Return again in 2 weeks.2.Work with patient on grief, stress management, coping, anxiety  Diagnosis: Axis I: Major Depressive Disorder, recurrent, moderate (history of bipolar) Generalized Anxiety Disorder   Axis II: No diagnosis    Coolidge Breeze, LCSW 11/27/2020

## 2020-12-11 ENCOUNTER — Ambulatory Visit (HOSPITAL_COMMUNITY): Payer: Medicare Other | Admitting: Licensed Clinical Social Worker

## 2020-12-25 ENCOUNTER — Ambulatory Visit (INDEPENDENT_AMBULATORY_CARE_PROVIDER_SITE_OTHER): Payer: Medicare Other | Admitting: Licensed Clinical Social Worker

## 2020-12-25 DIAGNOSIS — F411 Generalized anxiety disorder: Secondary | ICD-10-CM

## 2020-12-25 DIAGNOSIS — F331 Major depressive disorder, recurrent, moderate: Secondary | ICD-10-CM
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
## 2021-01-02 ENCOUNTER — Encounter (HOSPITAL_COMMUNITY): Payer: Self-pay | Admitting: Emergency Medicine

## 2021-01-02 ENCOUNTER — Ambulatory Visit (HOSPITAL_COMMUNITY)
Admission: EM | Admit: 2021-01-02 | Discharge: 2021-01-02 | Disposition: A | Discharge status: Home / Self Care | Payer: Medicare Other | Attending: Emergency Medicine | Admitting: Emergency Medicine

## 2021-01-02 ENCOUNTER — Other Ambulatory Visit: Payer: Self-pay

## 2021-01-02 DIAGNOSIS — L03317 Cellulitis of buttock: Secondary | ICD-10-CM | POA: Diagnosis not present

## 2021-01-02 MED ORDER — DOXYCYCLINE HYCLATE 100 MG PO CAPS: 100.0000 mg | ORAL_CAPSULE | Freq: Two times a day (BID) | ORAL | 0 refills | Status: DC

## 2021-01-02 NOTE — ED Provider Notes (Signed)
[facility]    CSN: [id number] Arrival date & time: 01/02/21  0900      History   Chief Complaint Chief Complaint  Patient presents with   Rash    HPI Diane Pacheco is a 50 y.o. female.   Patient here for evaluation of rash to tailbone that has been ongoing for the past 2 weeks.  Patient reports using steroid creams with minimal symptom relief.  Reports rash is painful and itchy.  Denies any changes to soaps, lotions, or detergents.  Denies any trauma, injury, or other precipitating event.  Denies any specific alleviating or aggravating factors.  Denies any fevers, chest pain, shortness of breath, N/V/D, numbness, tingling, weakness, abdominal pain, or headaches.    The history is provided by the patient.  Rash  Past Medical History:  Diagnosis Date   Asthma    exacerbations w/ respiratory infxns. Well controlled w/ abuterol PRN   Condyloma acuminatum    COPD (chronic obstructive pulmonary disease) (HCC)    Major depression    Unable to take antidepressents due to intolerance. Prefers to handle w/o Rx    Patient Active Problem List   Diagnosis Date Noted   Fatty liver 02/04/2020   Right upper quadrant pain 01/25/2020   Muscle strain 12/10/2019   Caregiver stress syndrome 06/15/2019   Fissure of vulva 09/22/2018   Bunion of great toe of right foot 02/10/2018   Hemorrhoids 02/10/2018   Stress incontinence 08/17/2017   Hyperlipidemia 05/27/2016   Bursitis of shoulder 01/07/2015   Bicipital tendinitis 01/07/2015   Chronic obstructive pulmonary disease (HCC) 06/25/2013   Bipolar disorder, unspecified (HCC) 06/19/2013   Rotator cuff tendonitis 06/19/2013   Obesity 07/15/2011   TOBACCO USER 01/25/2009    Past Surgical History:  Procedure Laterality Date   TUBAL LIGATION      OB History   No obstetric history on file.      Home Medications    Prior to Admission medications   Medication Sig Start Date End Date Taking? Authorizing Provider   doxycycline (VIBRAMYCIN) 100 MG capsule Take 1 capsule (100 mg total) by mouth 2 (two) times daily. 01/02/21  Yes Ivette Loyal, NP  albuterol (VENTOLIN HFA) 108 (90 Base) MCG/ACT inhaler Inhale 1-2 puffs into the lungs every 6 (six) hours as needed for wheezing or shortness of breath. INHALE ONE TO TWO PUFFS BY MOUTH EVERY 6 HOURS AS NEEDED FOR WHEEZING AND FOR SHORTNESS OF BREATH 10/27/20   Brimage, Vondra, DO  atorvastatin (LIPITOR) 20 MG tablet TAKE ONE TABLET BY MOUTH ONCE DAILY Patient not taking: Reported on 04/03/2018 10/18/16   Palma Holter, MD  cyclobenzaprine (FLEXERIL) 5 MG tablet Take 1 tablet (5 mg total) by mouth 3 (three) times daily as needed for muscle spasms. 12/10/19   Mirian Mo, MD  Foot Care Products (BUNION GUARD) PADS 1 each by Does not apply route as needed. Patient not taking: Reported on 04/03/2018 02/06/18   Garnette Gunner, MD  Lidocaine HCl 2 % CREA Apply 1 Squirt topically 3 (three) times daily as needed. 09/22/18   Leeroy Bock, MD  Spacer/Aero-Holding Rudean Curt Use with inhaler as instructed 06/19/13   Ozella Rocks, MD  tiotropium [facility]) 18 MCG inhalation capsule Place 1 capsule (18 mcg total) into inhaler and inhale every morning. 06/15/19   Garnette Gunner, MD  umeclidinium bromide (INCRUSE ELLIPTA) 62.5 MCG/INH AEPB Inhale 1 puff into the lungs every morning. 06/15/19   Garnette Gunner, MD  Family History Family History  Problem Relation Age of Onset   Cancer Mother        breast cancer at 77yo   Heart attack Mother        late 53s    COPD Mother    Healthy Father    Breast cancer Maternal Grandmother        around 90yo   Breast cancer Paternal Grandmother        around 21yo    Social History Social History   Tobacco Use   Smoking status: Every Day    Packs/day: 1.00    Types: Cigarettes   Smokeless tobacco: Never  Vaping Use   Vaping Use: Never used  Substance Use Topics   Alcohol use: Yes    Comment: weekly  weekends per pt   Drug use: Yes    Types: Marijuana    Comment: pt states daily use     Allergies   Patient has no known allergies.   Review of Systems Review of Systems  Skin:  Positive for rash.  All other systems reviewed and are negative.   Physical Exam Triage Vital Signs ED Triage Vitals [01/02/21 0943]  Enc Vitals Group     BP (!) 140/102     Pulse Rate 100     Resp 19     Temp 98.5 F (36.9 C)     Temp Source Oral     SpO2 99 %     Weight      Height      Head Circumference      Peak Flow      Pain Score      Pain Loc      Pain Edu?      Excl. in GC?    No data found.  Updated Vital Signs BP (!) 140/102 (BP Location: Left Arm)   Pulse 100   Temp 98.5 F (36.9 C) (Oral)   Resp 19   LMP 12/21/2020   SpO2 99%   Visual Acuity Right Eye Distance:   Left Eye Distance:   Bilateral Distance:    Right Eye Near:   Left Eye Near:    Bilateral Near:     Physical Exam Vitals and nursing note reviewed.  Constitutional:      General: She is not in acute distress.    Appearance: Normal appearance. She is not ill-appearing, toxic-appearing or diaphoretic.  HENT:     Head: Normocephalic and atraumatic.  Eyes:     Conjunctiva/sclera: Conjunctivae normal.  Cardiovascular:     Rate and Rhythm: Normal rate.     Pulses: Normal pulses.  Pulmonary:     Effort: Pulmonary effort is normal.  Abdominal:     General: Abdomen is flat.  Musculoskeletal:        General: Normal range of motion.     Cervical back: Normal range of motion.  Skin:    General: Skin is warm and dry.     Findings: Rash present. Rash is pustular (several pustules noted to upper gluteal cleft).  Neurological:     General: No focal deficit present.     Mental Status: She is alert and oriented to person, place, and time.  Psychiatric:        Mood and Affect: Mood normal.     UC Treatments / Results  Labs (all labs ordered are listed, but only abnormal results are displayed) Labs  Reviewed - No data to display  EKG   Radiology  No results found.  Procedures Procedures (including critical care time)  Medications Ordered in UC Medications - No data to display  Initial Impression / Assessment and Plan / UC Course  I have reviewed the triage vital signs and the nursing notes.  Pertinent labs & imaging results that were available during my care of the patient were reviewed by me and considered in my medical decision making (see chart for details).    Assessment negative for red flags or concerns.  Rash concerning for bacterial infection.  Will treat with doxycycline twice daily for the next 10 days.  May continue to use steroid cream as needed for comfort.  Recommend warm compresses or Epson salt soaks 3-4 times a day.  Follow-up as needed Final Clinical Impressions(s) / UC Diagnoses   Final diagnoses:  Cellulitis of buttock     Discharge Instructions      Take the doxycycline twice a day for the next 10 days.    You can continue to use steroid creams as needed for comfort.  You can apply a warm compress or epsom salt soak 3-4 times a day.   Return or go to the Emergency Department if symptoms worsen or do not improve in the next few days.      ED Prescriptions     Medication Sig Dispense Auth. Provider   doxycycline (VIBRAMYCIN) 100 MG capsule Take 1 capsule (100 mg total) by mouth 2 (two) times daily. 20 capsule Ivette Loyal, NP      PDMP not reviewed this encounter.   Ivette Loyal, NP 01/02/21 1020

## 2021-01-02 NOTE — ED Triage Notes (Signed)
Rash on tailbone for 2 weeks. Reports it is blister like. Denies drainage.

## 2021-01-02 NOTE — Discharge Instructions (Signed)
Take the doxycycline twice a day for the next 10 days.    You can continue to use steroid creams as needed for comfort.  You can apply a warm compress or epsom salt soak 3-4 times a day.   Return or go to the Emergency Department if symptoms worsen or do not improve in the next few days.

## 2021-01-08 ENCOUNTER — Ambulatory Visit (INDEPENDENT_AMBULATORY_CARE_PROVIDER_SITE_OTHER): Payer: Medicare Other | Admitting: Licensed Clinical Social Worker

## 2021-01-08 DIAGNOSIS — F411 Generalized anxiety disorder: Secondary | ICD-10-CM | POA: Diagnosis not present

## 2021-01-08 DIAGNOSIS — F331 Major depressive disorder, recurrent, moderate: Secondary | ICD-10-CM | POA: Diagnosis not present

## 2021-01-08 NOTE — Progress Notes (Signed)
Virtual Visit via Video Note  I connected with Diane Pacheco on 01/08/21 at  9:00 AM EDT by a video enabled telemedicine application and verified that I am speaking with the correct person using two identifiers.  Location: Patient: sitting in stationary car Provider: home office   I discussed the limitations of evaluation and management by telemedicine and the availability of in person appointments. The patient expressed understanding and agreed to proceed.  I discussed the assessment and treatment plan with the patient. The patient was provided an opportunity to ask questions and all were answered. The patient agreed with the plan and demonstrated an understanding of the instructions.   The patient was advised to call back or seek an in-person evaluation if the symptoms worsen or if the condition fails to improve as anticipated.  I provided 53 minutes of non-face-to-face time during this encounter.  THERAPIST PROGRESS NOTE  Session Time: 9:00 AM to 9:53 AM  Participation Level: Active  Behavioral Response: CasualAlertEuthymic  Type of Therapy: Individual Therapy  Treatment Goals addressed:  stress management, supportive interventions to help patient be caretaker for family members, emotional regulation for anxiety, anger, depression, strategies to help her in caretaking that will help with stress  Interventions: Solution Focused, Strength-based, Supportive, and Other: coping  Summary: Diane Pacheco is a 50 y.o. female who presents with father and wife got COVID have their vaccines. Patient went to urgent care got cellulitis bug bite that infected on antibiotics, patient explains that cellulitis is when deep tissue gets infected. For her it was a rash, not too stressed about it. Anxiety attacks lately thinks heart attack. Mom started having heart attacks at age 19. Diane Pacheco tells her that panic attack lie in bed and it will go away. In the evenings doesn't know the trigger.  Party next week Diane Pacheco and his daughter's birthday, Diane Pacheco's brother, mom and Diane Pacheco will come down. Diane Pacheco Diane Pacheco's best friend coming down with wife. Will do anything need fixing. Sees him every month.  Talked about various issues noted patient's good parenting skills understanding of negative impact on kids of social media, too much use of phone, niece who needs to lose some weight.  Therapist reviewed symptoms, facilitated expression of thoughts and feelings use this as treatment intervention help patient processed through feelings identify helpful coping strategies.  Noted recent anxiety attacks explored the source unable to identify but validated patient on catastrophizing symptoms but at the same time using reason to know more what is really going on.  Catastrophizing feeds the symptoms noted her partner Diane Pacheco helpful for her in de-escalating her.  Explored things patient has to look forward to, talked about dynamics family members, some of the current stressors for patient and talked about stress management strategies.  Therapist provided active listening open questions, supportive interventions Suicidal/Homicidal: No  Plan: Return again in 1 week.2. Therapist work with patient on stress management, grief, coping  Diagnosis: Axis I: Major Depressive Disorder, recurrent, moderate (history of bipolar) Generalized Anxiety Disorder    Axis II: No diagnosis    Diane Breeze, LCSW 01/08/2021

## 2021-01-15 ENCOUNTER — Ambulatory Visit (HOSPITAL_COMMUNITY): Payer: Medicare Other | Admitting: Licensed Clinical Social Worker

## 2021-01-15 NOTE — Progress Notes (Signed)
Therapist contacted patient by My Chart and she did not respond. Session is a no show.  ?

## 2021-02-05 ENCOUNTER — Ambulatory Visit (INDEPENDENT_AMBULATORY_CARE_PROVIDER_SITE_OTHER): Payer: Medicare Other | Admitting: Licensed Clinical Social Worker

## 2021-02-05 DIAGNOSIS — F411 Generalized anxiety disorder: Secondary | ICD-10-CM

## 2021-02-05 DIAGNOSIS — F331 Major depressive disorder, recurrent, moderate: Secondary | ICD-10-CM | POA: Diagnosis not present

## 2021-02-05 NOTE — Progress Notes (Signed)
Virtual Visit via Video Note  I connected with Diane Pacheco on 02/05/21 at  9:00 AM EDT by a video enabled telemedicine application and verified that I am speaking with the correct person using two identifiers.  Location: Patient: Stationary car Provider: home office   I discussed the limitations of evaluation and management by telemedicine and the availability of in person appointments. The patient expressed understanding and agreed to proceed.   I discussed the assessment and treatment plan with the patient. The patient was provided an opportunity to ask questions and all were answered. The patient agreed with the plan and demonstrated an understanding of the instructions.   The patient was advised to call back or seek an in-person evaluation if the symptoms worsen or if the condition fails to improve as anticipated.  I provided 54 minutes of non-face-to-face time during this encounter.  THERAPIST PROGRESS NOTE  Session Time: 9:00 AM to 9:54 AM  Participation Level: Active  Behavioral Response: CasualAlertappropriate  Type of Therapy: Individual Therapy  Treatment Goals addressed:  stress management, supportive interventions to help patient be caretaker for family members, emotional regulation for anxiety, anger, depression, strategies to help her in caretaking that will help with stress Interventions: Solution Focused, Strength-based, Supportive, and Other: grief, coping  Summary: Diane Pacheco is a 50 y.o. female who presents with doing ok. Her daughter, Diane Pacheco, has new job [employer] getting good money and excited about it. Diane Pacheco came over and came back from [place]. He wants to be a Librarian, academic and travel all over.  Therapist noted these things as being very positive things her son is involved with and patient agrees and not worried about him and his traveling.  Would like to see him more often. Paramedic and going to college. Diane Pacheco oldest and her rock.  He is doing good. Diane Pacheco is Bear's child. Doing pretty good. Triggers from reminders of Mom and her loss. Has her urn on mantel with her picture. Everybody got a little bit of her ashes. Thunderstorms remind her of her mom. Patient recognizes way she is is preserving positive memories. Therapist pointed out we can see qualities in ourselves that are like them. Patient is just like her Mom, looks just like her. Avid readers, like trivia.  Conversation transition to caring about different interests, interest in different television shows indicates patient able to engage and enjoy activities even though can be triggered still from grief reminders.  Therapist reviewed symptoms, facilitated expression of thoughts and feelings as treatment intervention to help patient process feelings to help cope with stressors, identify feelings to be able to work through them.  Worked on treatment goal of stress management related to family, patient sharing about her children, assess helpful for patient to talk about them and some of the positive news about them.  Worked on grief reviewed components of grief work for patient include psychoeducation, grieving the loss and noting patient right now preserving positive memories of the deceased.  Noted ways patient is able to preserve memories and therapist discussed other aspects that can help to grieving process such as knowing ways that we are like them, in patient's case different things they share to get together that remind her of her loved one.  Discussed different interests of patient with idea of these being different ways to cope and manage her mood, different activities she enjoys as well as spending time with family.  Processed through a specific event of patient reaching out to somebody who  was in stress and noted ways patient was able to help them, assess helpful for patient to note be aware of her positive qualities and ways that she helps other people is helpful for her  own mental health.  Therapist provided active listening, open questions, supportive interventions. Suicidal/Homicidal: No  Plan: Return again in 2 weeks.2.Therapist work with patient on stress management, grief, coping Diagnosis: Axis I: Major Depressive Disorder, recurrent, moderate (history of bipolar) Generalized Anxiety Disorder    Axis II: No diagnosis    Coolidge Breeze, LCSW 02/05/2021

## 2021-02-19 ENCOUNTER — Ambulatory Visit (INDEPENDENT_AMBULATORY_CARE_PROVIDER_SITE_OTHER): Payer: Medicare Other | Admitting: Licensed Clinical Social Worker

## 2021-02-19 DIAGNOSIS — F411 Generalized anxiety disorder: Secondary | ICD-10-CM

## 2021-02-19 DIAGNOSIS — F331 Major depressive disorder, recurrent, moderate: Secondary | ICD-10-CM

## 2021-02-19 NOTE — Progress Notes (Signed)
Virtual Visit via Telephone Note  I connected with Diane Pacheco on 02/19/21 at 10:00 AM EDT by telephone and verified that I am speaking with the correct person using two identifiers.  Location: Patient: stationary car Provider: home office   I discussed the limitations, risks, security and privacy concerns of performing an evaluation and management service by telephone and the availability of in person appointments. I also discussed with the patient that there may be a patient responsible charge related to this service. The patient expressed understanding and agreed to proceed.   I discussed the assessment and treatment plan with the patient. The patient was provided an opportunity to ask questions and all were answered. The patient agreed with the plan and demonstrated an understanding of the instructions.   The patient was advised to call back or seek an in-person evaluation if the symptoms worsen or if the condition fails to improve as anticipated.  I provided 53 minutes of non-face-to-face time during this encounter.  THERAPIST PROGRESS NOTE  Session Time: 10:00 AM to 10:53 AM  Participation Level: Active  Behavioral Response: CasualAlertappropriate  Type of Therapy: Individual Therapy  Treatment Goals addressed:  stress management, supportive interventions to help patient be caretaker for family members, emotional regulation for anxiety, anger, depression, strategies to help her in caretaking that will help with stress Interventions: Solution Focused, Strength-based, Supportive, and Other: grief, coping  Summary: Diane Pacheco is a 50 y.o. female who presents with not much going on mundane. 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  Assess helpful for patient to talk about this connection related to working on grief issues.  Patient related things mundane therapist suggested having plans for something to look forward to and noted Around routine by  going to a restaurant.  Patient related bear does not like going out explored other things that she can be looking forward to.  Planning for Thanksgiving. Everyone wants her but a lot of people and can be cooking for all those people on her own. If both sides come it will be a lot. Told them Bear's family this year.  Therapist and patient shared about their Thanksgiving ritual on some of the things they cook.  Shared about shows they are watching what we are looking forward to watching.  Patient got excited but mention lump some does not really recommend the books as well as shows related to it.  Therapist explored reading for patient as she used to be a big reader and patient says doesn't have the concentration to watch shows, and in general does not have concentration for reading.  There is another thing about her is that she likes learning and therapist that this also can be helpful for mental health.Therapist noted in session talking about stressors which is helpful for coping as well as things we enjoy that also related to mental health help improve mood.  Talked about funny incidents that have happened around holidays noted at the time feel awful but afterwards they can be really funny.  Therapist reviewed symptoms, facilitated expression of thoughts and feelings   Suicidal/Homicidal: No  Plan: Return again in 2 weeks.2.Therapist work with patient on stress management, grief, coping  Diagnosis: Axis I: Major Depressive Disorder, recurrent, moderate (history of bipolar) Generalized Anxiety Disorder    Axis II: No diagnosis    Coolidge Breeze, LCSW 02/19/2021

## 2021-03-06 ENCOUNTER — Ambulatory Visit (INDEPENDENT_AMBULATORY_CARE_PROVIDER_SITE_OTHER): Payer: Medicare Other | Admitting: Licensed Clinical Social Worker

## 2021-03-06 DIAGNOSIS — F411 Generalized anxiety disorder: Secondary | ICD-10-CM | POA: Diagnosis not present

## 2021-03-06 DIAGNOSIS — F331 Major depressive disorder, recurrent, moderate: Secondary | ICD-10-CM
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
## 2021-03-08 ENCOUNTER — Telehealth (HOSPITAL_COMMUNITY): Payer: Self-pay | Admitting: Licensed Clinical Social Worker

## 2021-03-10 NOTE — Telephone Encounter (Signed)
NA

## 2021-03-26 ENCOUNTER — Ambulatory Visit (INDEPENDENT_AMBULATORY_CARE_PROVIDER_SITE_OTHER): Payer: Medicare Other | Admitting: Licensed Clinical Social Worker

## 2021-03-26 DIAGNOSIS — F331 Major depressive disorder, recurrent, moderate: Secondary | ICD-10-CM

## 2021-03-26 DIAGNOSIS — F411 Generalized anxiety disorder: Secondary | ICD-10-CM | POA: Diagnosis not present

## 2021-03-26 NOTE — Progress Notes (Signed)
Virtual Visit via Video Note  I connected with Diane Pacheco on 03/26/21 at  9:00 AM EST by a video enabled telemedicine application and verified that I am speaking with the correct person using two identifiers.  Location: Patient: stationary car Provider: home office   I discussed the limitations of evaluation and management by telemedicine and the availability of in person appointments. The patient expressed understanding and agreed to proceed.   I discussed the assessment and treatment plan with the patient. The patient was provided an opportunity to ask questions and all were answered. The patient agreed with the plan and demonstrated an understanding of the instructions.   The patient was advised to call back or seek an in-person evaluation if the symptoms worsen or if the condition fails to improve as anticipated.  I provided 53 minutes of non-face-to-face time during this encounter.  THERAPIST PROGRESS NOTE  Session Time: 9:00 AM to 9:53 AM  Participation Level: Active  Behavioral Response: CasualAlertEuthymic  Type of Therapy: Individual Therapy  Treatment Goals addressed:  stress management, supportive interventions to help patient be caretaker for family members, emotional regulation for anxiety, anger, depression, strategies to help her in caretaking that will help with stress Interventions: Motivational Interviewing, Strength-based, Supportive, and Other: coping  Summary: Diane Pacheco is a 50 y.o. female who presents with talked about preparing for the holidays. Patient preparations are going to easy and without too much stress. She said worked hard for past two years so over and done this year for working too hard. Talked about celebrating Thanksgiving having family and little one over and really enjoying having them around. Really enjoyed Bear's nephew's child who was 2 who was fascinated with things like the pool table. Things worked out. Had a good birthday.  Cooked herself some elegant chicken and described how she cooked it to therapist and how good it is.  She got one  present, [name] took one of mom's pictures and put on canvas and can put on the wall.  Patient said was not tearful but did mention a commercial and says says hates Coke Cola and gets her all the time. Man making recipes from mom's recipe book. Her picture on the wall and they say making new memories. Hates it but loves it. Discussed ways to incorporate memories of loved one during holidays. Saying "beer and ice" brings memories of her Mom. [name] misses her too. Preserving memories means smoking a joint used to do with her and think about her, do that in the evening. Patient says how manages her anxiety. Talk about her during those times. Different expressions remind her of her Mom. . Talked about different meals and dishes bring back memories. Yorkshire pudding. Transitioned to talking about her son, Sharia Reeve, is leaving Jan 8. Going to [place] to college. Then talked about other interests that keep patient occupied such as television shows.  Both therapist and patient able to share their interest and make recommendations for each other  Therapist reviewed symptoms, facilitated expression of thoughts and feelings in this case utilize to focus on positive activities of planning for holidays being with family and at the same time discussed coping strategies for stressors around the holidays.  Noted patient's good management of stressors making things easier on herself this year.  Give her chance to enjoy herself.  Worked on grief with patient talked about ways to preserve positive memory of the deceased different ways patient is able to do that such as expressions they  use that remind them of her mom, in the evening getting together with a joint and sharing memories, able to preserve memories during activities of daily life.  Noted commercials that can bring up emotion but also a positive thing,  noted patient getting a picture of mom for birthday and how thoughtful family members are to give her these type of presents as ways to preserve memories.  Therapist provided active listening open questions, supportive intervention Suicidal/Homicidal: No  Plan: Return again in 2 weeks.2.Therapist work with patient on stress management, grief, coping  Diagnosis: Axis I:  Major Depressive Disorder, recurrent, moderate (history of bipolar) Generalized Anxiety Disorder     Axis II: No diagnosis    Diane Breeze, LCSW 03/26/2021

## 2021-04-09 ENCOUNTER — Ambulatory Visit (INDEPENDENT_AMBULATORY_CARE_PROVIDER_SITE_OTHER): Payer: Medicare Other | Admitting: Licensed Clinical Social Worker

## 2021-04-09 DIAGNOSIS — F331 Major depressive disorder, recurrent, moderate: Secondary | ICD-10-CM

## 2021-04-09 DIAGNOSIS — F411 Generalized anxiety disorder: Secondary | ICD-10-CM | POA: Diagnosis not present

## 2021-04-09 NOTE — Progress Notes (Signed)
Virtual Visit via Video Note  I connected with Diane Pacheco on 04/09/21 at 10:00 AM EST by a video enabled telemedicine application and verified that I am speaking with the correct person using two identifiers.  Location: Patient: home Provider: home office   I discussed the limitations of evaluation and management by telemedicine and the availability of in person appointments. The patient expressed understanding and agreed to proceed.   I discussed the assessment and treatment plan with the patient. The patient was provided an opportunity to ask questions and all were answered. The patient agreed with the plan and demonstrated an understanding of the instructions.   The patient was advised to call back or seek an in-person evaluation if the symptoms worsen or if the condition fails to improve as anticipated.  I provided 40 minutes of non-face-to-face time during this encounter.  THERAPIST PROGRESS NOTE  Session Time: 10:00 AM to 10:40 AM  Participation Level: Active  Behavioral Response: CasualAlertEuthymic  Type of Therapy: Individual Therapy  Treatment Goals addressed:  stress management, supportive interventions to help patient be caretaker for family members, emotional regulation for anxiety, anger, depression, strategies to help her in caretaking that will help with stress Interventions: Solution Focused, Strength-based, Supportive, and Other: coping  Summary: Diane Pacheco is a 50 y.o. female who presents with could not sleep this morning woke up early explored why just 1 of those days. Sometimes wake up and wide awake. Explored her plans for the weekend. Has to go out to get a couple things for Christmas. Got most of it done. Banned everyone from her house. Getting together at [facility] on Saturday. On Christmas day her and [name] will be on their own and making prime rib. Talked about that being fun said never had it before there were always other people. had kids,  they would be somewhere on Christmas with family, this year just her and Bear. Kayren Eaves to have Bailey's with coffee, have a fireplace going, watch Christmas movies. Talked about presents and laugh when she said she got there new wheels for his chair left because surprisingly expensive therapist shared her own funny gift for her boyfriend.  Bought Ivin Booty a nice pen. He is going to college in [place]. She is making pumpkin cookies with caremel icing to bring to family gathering.  Talked about Christmas movies again a watch and like funny ones.  Therapist emphasize having nothing to do in looking forward to it that it would just be fine patient also has hands where she can just enjoy herself.  Therapist reviewed symptoms, facilitated expression of thoughts and feelings focused on Christmas plans and therapist thinks that was positive focus as it helped elevate mood with patient sharing her plans looking forward to and therapist sharing her plans to also share in the spirit of looking forward to Christmas.  Therapist noted positive patient does not have to put so much work into it this year and that can help her to just enjoy her Christmas.  Utilize session also to talk about some of the funny aspects that happen with Christmas some of the presents being funny wants not to sentimental.  Shared some ideas of ways to enjoy to add to Christmas celebration and that was fun way to spend session.  Noted patient spending time with her loved 1 on Christmas Day by themselves would be something to enjoy together.  Session and processing feelings help increase positive mood, therapist also provided supportive strength-based, active listening open questions Suicidal/Homicidal:  No  Plan: Return again in 2 weeks.2.Therapist work with patient on stress management, grief, coping   Diagnosis: Axis I:   Major epressive Disorder, recurrent, moderate (history of bipolar) Generalized Anxiety Disorder    Axis II: No  diagnosis    Coolidge Breeze, LCSW 04/09/2021

## 2021-04-24 ENCOUNTER — Ambulatory Visit (INDEPENDENT_AMBULATORY_CARE_PROVIDER_SITE_OTHER): Payer: Medicare Other | Admitting: Licensed Clinical Social Worker

## 2021-04-24 DIAGNOSIS — F331 Major depressive disorder, recurrent, moderate: Secondary | ICD-10-CM | POA: Diagnosis not present

## 2021-04-24 DIAGNOSIS — F411 Generalized anxiety disorder: Secondary | ICD-10-CM
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
## 2021-05-07 ENCOUNTER — Ambulatory Visit (INDEPENDENT_AMBULATORY_CARE_PROVIDER_SITE_OTHER): Payer: Medicare Other | Admitting: Licensed Clinical Social Worker

## 2021-05-07 DIAGNOSIS — F331 Major depressive disorder, recurrent, moderate: Secondary | ICD-10-CM | POA: Diagnosis not present

## 2021-05-07 DIAGNOSIS — F411 Generalized anxiety disorder: Secondary | ICD-10-CM

## 2021-05-07 NOTE — Progress Notes (Signed)
Virtual Visit via Video Note  I connected with Diane Pacheco on 05/07/21 at  9:00 AM EST by a video enabled telemedicine application and verified that I am speaking with the correct person using two identifiers.  Location: Patient: home Provider: home office   I discussed the limitations of evaluation and management by telemedicine and the availability of in person appointments. The patient expressed understanding and agreed to proceed.   I discussed the assessment and treatment plan with the patient. The patient was provided an opportunity to ask questions and all were answered. The patient agreed with the plan and demonstrated an understanding of the instructions.   The patient was advised to call back or seek an in-person evaluation if the symptoms worsen or if the condition fails to improve as anticipated.  I provided 48 minutes of non-face-to-face time during this encounter.  THERAPIST PROGRESS NOTE  Session Time: 9:00 AM to 9:48 AM  Participation Level: Active  Behavioral Response: CasualAlertAnxious  Type of Therapy: Individual Therapy  Treatment Goals addressed:  stress management, supportive interventions to help patient be caretaker for family members, emotional regulation for anxiety, anger, depression, strategies to help her in caretaking that will help with stress Interventions: Solution Focused, Strength-based, Supportive, and Other: coping  Summary: Diane Pacheco is a 51 y.o. female who presents with telling therapist it has been crazy here, Bear not walking, he is on a walker now. Pulled something. They have gone 3-4 days without falling before that had been falling.  Noted the concern with this he is not leaving his bedroom. Went to doctor wouldn't give his pain pills without seeing him. He is pain already he lays there for three hours because of pain, when he is pain cranky. He is fussy anyway and then pain on top of that. It is his pride does not feel like  a man because can't walk. She tries to help him understand that not being able to walk has nothing to do do with being a man. She can't do anything right, but used to it and let's it roll. She says she is tough and can handle it. Aspect of caregiver. Watching murder series and that helps with coping watching Cold Case. Watching Explore with [place] looking at interrogation techniques. Explored some more positive moments and he can say sweet things to her in joking way that is affectionate"covers it is with his meanness." Positive quality that he doesn't want to get addicted only take so many pain pills.  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 Stepped in a hole with ladder threw out back, and DDD. He has rods and pins in his back, two back surgeries. Before 2nd surgery cocked forward and to the side. Straighten up with pins and rods to get spaces so helped. He can't feel legs neuropathy waist down. Can't keep balance. Nerves get damaged from being pinched and depressed. Explored things would help. Like to get to a pool take off pressure and exercise. Patient transitioned to sharing new addition Bear's brother's-Cory, his son-Cody, had baby-Jaxton, new nephew. Talked about caretaker with stepfather and that taking basic needs matter a lot. Transitioned to talk about the stress of prices right now.   Therapist reviewed symptoms, facilitated expression of thoughts and feelings utilizing this as main treatment intervention to help patient and processing feelings to help release feelings to help cope with feelings.  Worked actively on treatment goal continue to  make progress with goal as patient discussed stressors of caretaking as well as coping strategies.  Patient has insight to know her partner is struggling so taking out his issues on her and knowing this pattern helps her be able to better cope, therapist suggested using humor, reframing that helps  to get the message across to him but not escalate and patient uses that as a strategy.  Discussed he is struggling with his manhood and how she is trying to help him understand that manhood is not related to physical abilities therapist also adding its intangible its more about spirit and qualities but at the same time recognizing its hard for men always to understand that.  Looked at options to help alleviate his pain issues noted at this point its relatively early in to help him understand give this time before framing things so negative ways.  Looked at ways patient can cope such as getting a break when she looks at her tablet, that series she is interested in.  Continue to work on caretaking in general as a treatment goal as patient continues in that role in different relationships noted in general health significant that role is in people's life's important part that she plays.  Completed treatment plan and patient gave consent to complete virtually.  Noted therapy is useful tool for patient with different stressors and mental health issues.  Therapist provided active listening open questions, supportive interventions.  Suicidal/Homicidal: No  Plan: Return again in 2 weeks.2. Therapist work with patient on stress management, grief, coping  Diagnosis: Axis I:  Major Depressive Disorder, recurrent, moderate (history of bipolar) Generalized Anxiety Disorder    Axis II: No diagnosis    Cordella Register, LCSW 05/07/2021

## 2021-05-21 ENCOUNTER — Ambulatory Visit (INDEPENDENT_AMBULATORY_CARE_PROVIDER_SITE_OTHER): Payer: Medicare Other | Admitting: Licensed Clinical Social Worker

## 2021-05-21 DIAGNOSIS — F411 Generalized anxiety disorder: Secondary | ICD-10-CM

## 2021-05-21 DIAGNOSIS — F331 Major depressive disorder, recurrent, moderate: Secondary | ICD-10-CM
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
## 2021-06-04 ENCOUNTER — Ambulatory Visit (INDEPENDENT_AMBULATORY_CARE_PROVIDER_SITE_OTHER): Payer: Medicare Other | Admitting: Licensed Clinical Social Worker

## 2021-06-04 DIAGNOSIS — F411 Generalized anxiety disorder: Secondary | ICD-10-CM

## 2021-06-04 DIAGNOSIS — F331 Major depressive disorder, recurrent, moderate: Secondary | ICD-10-CM | POA: Diagnosis not present

## 2021-06-04 NOTE — Progress Notes (Signed)
Virtual Visit via Video Note  I connected with Diane Pacheco on 06/04/21 at  9:00 AM EST by a video enabled telemedicine application and verified that I am speaking with the correct person using two identifiers.  Location: Patient: home Provider: home office   I discussed the limitations of evaluation and management by telemedicine and the availability of in person appointments. The patient expressed understanding and agreed to proceed.   I discussed the assessment and treatment plan with the patient. The patient was provided an opportunity to ask questions and all were answered. The patient agreed with the plan and demonstrated an understanding of the instructions.   The patient was advised to call back or seek an in-person evaluation if the symptoms worsen or if the condition fails to improve as anticipated.  I provided 50 minutes of non-face-to-face time during this encounter.  THERAPIST PROGRESS NOTE  Session Time: 10:00 AM to 10:50 AM  Participation Level: Active  Behavioral Response: CasualAlertAnxious although therapy helpful to help patient processed through that anxiety and euthymic in session  Type of Therapy: Individual Therapy  Treatment Goals addressed:  stress management, supportive interventions to help patient be caretaker for family members, emotional regulation for anxiety, anger, depression, strategies to help her in caretaking that will help with stress  ProgressTowards Goals: Progressing-patient actively using treatment to work on stress management continues in a caretaker role and this is particularly stressful so helpful to verbalize feelings as well as identify coping to manage caretaking responsibilities.  Interventions: Solution Focused, Strength-based, Supportive, and Other: coping  Summary: Diane Pacheco is a 51 y.o. female who presents with spent 9 days at [employer]. Right now with Bear's legs not working can't get around the house at  mom's because she is a Chartered loss adjuster like he can move around more easily where they are with hardwood floors. Legs are worse he has no feeling mid calf down. 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 Haven't been able to play pool one thing he had. Now he doesn't have that. Lots of falls. Doctor sending him back to neurosurgeon. Already told by doctor that was his last surgery.  Reviewed possible strategies for him patient says wants him in water to take the weight off his back.  Discussed some of the barriers to this that his weight is an issue so not sure if that is possible maybe a shot to get inflammation down. He has a hard time getting to the bathroom. His mood is impacted and patient is the root of all evil. Therapist noted it makes it hard on her. He wants a lot of things done, extra things besides regular things only so many hours in day what happens when asks her to do things she will be at a point in her day where she hasn't sat down. Therapist noted caretaker or housing cleaning seems to be ongoing thing so very relevant for therapy as she works on stress of being a caretaker. He correlates mobility with manhood. We know not true but some men will correlate that. Patient did buck up a little bit last night. He started yelling and patient in middle of dishes and just told him to stop. That she knows what to do and doesn't know how to be reminded. Looked at what is ahead for the day not to bad the store and that is her break to go to the store. Noted getting treats and patient said not a big eater is  a cook but likes it because likes to watch people eating. Made Elegant Chicken Valentine's Day which has chicken, dried beef, bacon sour cream and cream of mushroom. Check in with mood irritated. Frustrated have to do everything all the time and it is going to get worse will have to add lawn mowing which likes but takes away from time of house cleaning.  Talked about some of her interests including  television shows patient explains has to entertain him he lives vicariously through television.  Assess engaging with patient and helpful for mood.  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  Assess is helpful for patient to have an outlet to express her feelings as she said she is frustrated and irritated helps her to release feelings and processed through them.  Also ways to share about her interest such as cooking and watching shows that can be a way to enhance mood.  Noted stress has gotten worse with her partner having troubles with his legs processed through some coping strategies for that which includes options for him to get care but still have to wait to see what the doctor says.  Noted this probably add stress to patient.  This patient appreciates having an outlet where she can vent and also talk about fun things that interest her.  Therapist provided active listening open questions supportive interventions.  Suicidal/Homicidal: No  Plan: Return again in 2 weeks.2.Therapist work with patient on stress management, grief, coping  Diagnosis: No diagnosis found.  Collaboration of Care: Other as needed   Patient/Guardian was advised Release of Information must be obtained prior to any record release in order to collaborate their care with an outside provider. Patient/Guardian was advised if they have not already done so to contact the registration department to sign all necessary forms in order for [place] to release information regarding their care.   Consent: Patient/Guardian gives verbal consent for treatment and assignment of benefits for services provided during this visit. Patient/Guardian expressed understanding and agreed to proceed.   Coolidge Breeze, LCSW 06/04/2021

## 2021-06-18 ENCOUNTER — Ambulatory Visit (INDEPENDENT_AMBULATORY_CARE_PROVIDER_SITE_OTHER): Payer: Medicare Other | Admitting: Licensed Clinical Social Worker

## 2021-06-18 DIAGNOSIS — F331 Major depressive disorder, recurrent, moderate: Secondary | ICD-10-CM

## 2021-06-18 DIAGNOSIS — F411 Generalized anxiety disorder: Secondary | ICD-10-CM | POA: Diagnosis not present

## 2021-06-18 NOTE — Progress Notes (Signed)
Virtual Visit via Telephone Note ? ?I connected with Diane Pacheco on 06/18/21 at  9:00 AM EST by telephone and verified that I am speaking with the correct person using two identifiers. ? ?Location: ?Patient: home ?Provider: home office ?  ?I discussed the limitations, risks, security and privacy concerns of performing an evaluation and management service by telephone and the availability of in person appointments. I also discussed with the patient that there may be a patient responsible charge related to this service. The patient expressed understanding and agreed to proceed. ? ?I discussed the assessment and treatment plan with the patient. The patient was provided an opportunity to ask questions and all were answered. The patient agreed with the plan and demonstrated an understanding of the instructions. ?  ?The patient was advised to call back or seek an in-person evaluation if the symptoms worsen or if the condition fails to improve as anticipated. ? ?I provided 30 minutes of non-face-to-face time during this encounter. ? ?THERAPIST PROGRESS NOTE ? ?Session Time: 9:00 AM to 9:30 AM ? ?Participation Level: Active ? ?Behavioral Response: CasualAlertappropriate reports being tired ? ?Type of Therapy: Individual Therapy ? ?Treatment Goals addressed: stress management, supportive interventions to help patient be caretaker for family members, emotional regulation for anxiety, anger, depression, strategies to help her in caretaking that will help with stress ? ?ProgressTowards Goals: Progressing-assess feelings around being a caretaker helps patient with coping explored coping strategies as well to manage stressors of being a caretaker ? ?Interventions: Solution Focused, Strength-based, Supportive, and Other: coping ? ?Summary: Diane Pacheco is a 51 y.o. female who presents with went to back doctor yesterday. Doctor said his hardware was ok. (DDD-will break down no matter what-if could get nerves from  being pinched that would fantastic) is  compressing need to get a MRI with contrast. MRI on 3/14. He is Art gallery manager now. Therapist pointed out can get around more and that helps his mood and patient agrees. Plays pool with scooter. Feels better now that seen doctor. Explored her stressors up early yesterday for doctor's appointment. Stayed up late and woke up early today. Talked about sleep and have to watch movies as a distraction to stop racing thoughts.  Mood how patient felt about continuing session she said back to sleep would be okay with ending so finish session early.  Therapist provided active listening open questions supportive interventions.  Noted some positive developments with her partner bear and how that can have a positive impact on patient.    ? ?Suicidal/Homicidal: No ? ?Plan: Return again in 2 weeks.2.Therapist work with patient on stress management, grief, coping ? ? ? ?Diagnosis: Major Depressive Disorder, recurrent, moderate (history of bipolar) Generalized Anxiety Disorder ? ?Collaboration of Care: Other none needed ? ?Patient/Guardian was advised Release of Information must be obtained prior to any record release in order to collaborate their care with an outside provider. Patient/Guardian was advised if they have not already done so to contact the registration department to sign all necessary forms in order for [place] to release information regarding their care.  ? ?Consent: Patient/Guardian gives verbal consent for treatment and assignment of benefits for services provided during this visit. Patient/Guardian expressed understanding and agreed to proceed.  ? ?Coolidge Breeze, LCSW ?06/18/2021 ? ?

## 2021-07-02 ENCOUNTER — Ambulatory Visit (INDEPENDENT_AMBULATORY_CARE_PROVIDER_SITE_OTHER): Payer: Medicare Other | Admitting: Licensed Clinical Social Worker

## 2021-07-02 DIAGNOSIS — F411 Generalized anxiety disorder: Secondary | ICD-10-CM

## 2021-07-02 DIAGNOSIS — F331 Major depressive disorder, recurrent, moderate: Secondary | ICD-10-CM | POA: Diagnosis not present
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
## 2021-07-16 ENCOUNTER — Ambulatory Visit (INDEPENDENT_AMBULATORY_CARE_PROVIDER_SITE_OTHER): Payer: Medicare Other | Admitting: Licensed Clinical Social Worker

## 2021-07-16 DIAGNOSIS — F411 Generalized anxiety disorder: Secondary | ICD-10-CM

## 2021-07-16 DIAGNOSIS — F331 Major depressive disorder, recurrent, moderate: Secondary | ICD-10-CM
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
## 2021-08-04 ENCOUNTER — Ambulatory Visit (INDEPENDENT_AMBULATORY_CARE_PROVIDER_SITE_OTHER): Payer: Medicare Other | Admitting: Licensed Clinical Social Worker

## 2021-08-04 DIAGNOSIS — F411 Generalized anxiety disorder: Secondary | ICD-10-CM | POA: Diagnosis not present

## 2021-08-04 DIAGNOSIS — F331 Major depressive disorder, recurrent, moderate: Secondary | ICD-10-CM | POA: Diagnosis not present

## 2021-08-04 NOTE — Progress Notes (Signed)
Virtual Visit via Video Note ? ?I connected with Diane Pacheco on 08/04/21 at 10:00 AM EDT by a video enabled telemedicine application and verified that I am speaking with the correct person using two identifiers. ? ?Location: ?Patient: home ?Provider: office ?  ?I discussed the limitations of evaluation and management by telemedicine and the availability of in person appointments. The patient expressed understanding and agreed to proceed. ? ?I discussed the assessment and treatment plan with the patient. The patient was provided an opportunity to ask questions and all were answered. The patient agreed with the plan and demonstrated an understanding of the instructions. ?  ?The patient was advised to call back or seek an in-person evaluation if the symptoms worsen or if the condition fails to improve as anticipated. ? ?I provided 52 minutes of non-face-to-face time during this encounter. ? ?THERAPIST PROGRESS NOTE ? ?Session Time:10:00 AM to 10:52 AM ? ?Participation Level: Active ? ?Behavioral Response: CasualAlertAnxious and Dysphoric ? ?Type of Therapy: Individual Therapy ? ?Treatment Goals addressed: stress management, supportive interventions to help patient be caretaker for family members, emotional regulation for anxiety, anger, depression, strategies to help her in caretaking that will help with stress ? ?ProgressTowards Goals: Progressing-patient actively using session to help her process feelings to help her cope with stressors, noted patient's own reframing to help with coping ? ?Interventions: DBT, Solution Focused, Strength-based, Supportive, Reframing, and Other: Coping ? ?Summary: Diane Pacheco is a 51 y.o. female who presents with more has happened. The Wednesday before Easter Bear woke her up saying call 911 couldn't breath his throat completely shut up. 3-4 minutes later were going to [facility] and knew something was bad. Apparently he passed out in ambulance and couldn't breath. Tried  to give a trach tube couldn't get through. Ended up having to incubate him. Failed trach tube he blew up like a balloon and collapsed both his lungs, had to put in chest tubes. Had him to hooked up to "everything and its mother" massive amounts of steroids and antibiotics. Day after that put him in ICU. Was arguing with him with a tube in throat when she told herself he will be fine. Told them need to sedate him more to staff. He woke up enough ripped tubes out of him including the one in throat. Providers blaming it on acid reflux doesn't seem like that could be it. When he breathed last year could hear flap in his throat making a popping noise. Hard to catch breath to get it open. Still waiting to hear from Ear Nose Throat doctor. They are saying it was fluke thing a spasm in larynx. He went to hospital from  Wednesday morning until Sunday of Easter. Doing better now got a new bed, raised heartburn pill and added another one. Thinking because he lays flat if his stomach opening doesn't close all the way acid comes all the way up and sits all night. Trying to get hooked up with [employer]. Trying to him a scholarship. He is working on cutting back on his drinking and smoking. Portion control. Now his back doctor really won't do surgery worried about breathing already. Wants to be able to walk. Patient wants to drink and smoke more because totally stressed. "Do what I need to keep to keep brain." Not able to do anything about the [name] thing. Just got SSI and [facility] see if [facility] give [facility] if not see can get subpoena. Glad that [name] he is home and alive.  Must be high dose steroids helped his back. Guided patient in finding meaning, vacation some DBT skills. Could have been worse and happened at the beach. DBT skill Encouragement "It will be ok." Got a good deal with mattress. Missing tags from car went to [facility]. Nice couple gave her cash for [employer]. Transitioned to talking about being at hospital with [facility] and  somebody who asked if alright and do you need a hug. Eyes puffed and was crying. It was nice. Another lady gave her a hug. It was nice to get that niceness. Normally patient is the one giving the care. Orlie Pollen and [place] don't like each other and she came straight up there. People showing up you don't have to do it yourself. Found out Bo didn't like mom never knew that. She left them when little with Dad and he never understood the reason. They still don't want to fight the will for [name]. For [employer] muscle cramps and tiredness excuse to not do nothing. Certified to do everything and never done anything with any of it likes school more than work. Can play anything and can paint anything but never profits from it. Limiting self. Age difference 3 years. Life back to normal. Guided patient to keep moving on as coping and helps get through difficulty.      ? ?Therapist reviewed symptoms, facilitated expression of thoughts and feelings noted patient having 1 crisis after another most recent crisis with various medical issue.  Patient utilized session to process feelings related to stressors.Therapist provided quote from Shona Simpson "when we put our traumatic experiences into words we tend to become less concerned with the emotional events that have been weighing [place] down."  Talked about some distress tolerance skills could be used such as words of encouragement, learning from her experiences.  Noted helpfulness for patient is caretaker to get a gesture of caretaking from other people how helpful that is for her also nice to see glimmer of positive in the world.  Patient able to see some silver linings noted good coping for example there is a positive they were on vacation when all this happened.  Therapist provided support and space to patient as she shared her thoughts and feelings in session ? ?Suicidal/Homicidal: No ? ?Plan: Return again in 2.Therapist work with patient on stress management, grief, coping  weeks. ? ?Diagnosis: Major Depressive Disorder, recurrent, moderate (history of bipolar) Generalized Anxiety Disorder ? ? ?Collaboration of Care: Other none needed ? ?Patient/Guardian was advised Release of Information must be obtained prior to any record release in order to collaborate their care with an outside provider. Patient/Guardian was advised if they have not already done so to contact the registration department to sign all necessary forms in order for [place] to release information regarding their care.  ? ?Consent: Patient/Guardian gives verbal consent for treatment and assignment of benefits for services provided during this visit. Patient/Guardian expressed understanding and agreed to proceed.  ? ?Coolidge Breeze, LCSW ?08/04/2021 ? ?

## 2021-08-18 ENCOUNTER — Ambulatory Visit (INDEPENDENT_AMBULATORY_CARE_PROVIDER_SITE_OTHER): Payer: Medicare Other | Admitting: Licensed Clinical Social Worker

## 2021-08-18 DIAGNOSIS — F331 Major depressive disorder, recurrent, moderate: Secondary | ICD-10-CM

## 2021-08-18 DIAGNOSIS — F411 Generalized anxiety disorder: Secondary | ICD-10-CM
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
## 2021-09-03 ENCOUNTER — Ambulatory Visit (HOSPITAL_COMMUNITY): Payer: Medicare Other | Admitting: Licensed Clinical Social Worker

## 2021-09-17 ENCOUNTER — Ambulatory Visit (INDEPENDENT_AMBULATORY_CARE_PROVIDER_SITE_OTHER): Payer: Medicare Other | Admitting: Licensed Clinical Social Worker

## 2021-09-17 DIAGNOSIS — F331 Major depressive disorder, recurrent, moderate: Secondary | ICD-10-CM | POA: Diagnosis not present

## 2021-09-17 DIAGNOSIS — F411 Generalized anxiety disorder: Secondary | ICD-10-CM | POA: Diagnosis not present

## 2021-09-17 NOTE — Progress Notes (Signed)
Virtual Visit via Video Note  I connected with Diane Pacheco on 09/17/21 at 11:00 AM EDT by a video enabled telemedicine application and verified that I am speaking with the correct person using two identifiers.  Location: Patient: [name] house outside Provider: home office   I discussed the limitations of evaluation and management by telemedicine and the availability of in person appointments. The patient expressed understanding and agreed to proceed.   I discussed the assessment and treatment plan with the patient. The patient was provided an opportunity to ask questions and all were answered. The patient agreed with the plan and demonstrated an understanding of the instructions.   The patient was advised to call back or seek an in-person evaluation if the symptoms worsen or if the condition fails to improve as anticipated.  I provided 53 minutes of non-face-to-face time during this encounter.  THERAPIST PROGRESS NOTE  Session Time: 11:00 AM to 11:53 AM  Participation Level: Active  Behavioral Response: CasualAlertappropriate  Type of Therapy: Individual Therapy  Treatment Goals addressed:  stress management, supportive interventions to help patient be caretaker for family members, emotional regulation for anxiety, anger, depression, strategies to help her in caretaking that will help with stress  ProgressTowards Goals: Progressing-worked on ongoing issues being a caretaker in this case helping Bears mom out, stress management noted rewards from both helping family and from physical labor.  Processed feelings to help with coping  Interventions: Solution Focused, Strength-based, Assertiveness Training, Supportive, and Other: coping  Summary: Diane Pacheco is a 51 y.o. female who presents with things are ok. At [employer] building has a Biomedical scientist. Cleaning out the whole garage. Mom, Gabrielle Dare, Joyce Gross Ray came out over the weekend to help. A little bit every day hard  on her body. Looks good and needing to get done for years. Has to clean her house out so needs to have a room for Braelyn. Ready for it to be done ready to get home. Almost ready to do building and then help with computer room. Came out last Thursday. Proud of Mom get rid of more than thought she would be able to. Doing pretty good hanging out with Mom, that is working out good. [name] is going to start an exercise bike. Noted this as positive with health issues. They are going to have surgery on throat. Went to Ear Nose throat doctor. It is like there is on top of larynx a skin tag and when breath sucks down and blocks his airway. Still can't get in the [facility] not cooperating with her. Turned application for financial aid. Would be helpful for him to lose weight. He has severe back issues. Normally procedure outpatient with his health issues inpatient and keep him over night to monitor. Done in terms of challenging estate. Dads business slow down dementia bad, cancel cable, only Internet. That impacted that. Only have Netflix and Amazon. Talk about stressors from prices of things. Glad they got that done passing the budget discussed stress comes from what is happening on news so glad that was done. Impacts patient's social security so she is happy about that. Pretty good with stress and anxiety just worried about trouble with Dad. With [name]'s estate doesn't have time money and energy with Bear's health and keeping the money straight. Believes in [place]. Dealing with just the basic anxiety and patient agrees anxiety that comes as part of life. Anxiety related to dad's dementia increasing. Worried still working and goes out of town.  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 Afraid if he is idle goes down hill quitting trying to encourage him to go as much as possible. See him once a week and talk to him 2-3 times a week.    Therapist reviewed symptoms,  facilitated expression of thoughts and feelings giving patient space and support to express her feelings and thoughts also helps with coping with emotions, coping with stressors.  Verbalizing emotions help and de-escalation of symptoms.  About various health issues and some positive developments of his plan to start exercising and surgery to address throat issue next month.  Noted ongoing financial stress related to economy were prices have increased and trying to manage.  Dressed the father with dementia trying to monitor this to make sure he is doing okay.  Normalized the anxiety that comes up from daily life as providing perspective that can help with coping.  Noted positive constructive activity patient helping out various mom positive to help her out when she does so much for them.  Also enjoys being with family.  Also patient says she likes physical labor likes the instant gratification of positive results and can see some positive results. Suicidal/Homicidal: No  Plan: Return again in 2 weeks.2.Therapist work with patient on stress management, grief, coping   Diagnosis: Major Depressive Disorder, recurrent, moderate (history of bipolar) Generalized Anxiety Disorder  Collaboration of Care: Other none needed  Patient/Guardian was advised Release of Information must be obtained prior to any record release in order to collaborate their care with an outside provider. Patient/Guardian was advised if they have not already done so to contact the registration department to sign all necessary forms in order for [place] to release information regarding their care.   Consent: Patient/Guardian gives verbal consent for treatment and assignment of benefits for services provided during this visit. Patient/Guardian expressed understanding and agreed to proceed.   Coolidge Breeze, LCSW 09/17/2021

## 2021-09-22 ENCOUNTER — Encounter: Payer: Self-pay | Admitting: *Deleted

## 2021-09-28 ENCOUNTER — Ambulatory Visit (INDEPENDENT_AMBULATORY_CARE_PROVIDER_SITE_OTHER): Payer: Medicare Other | Admitting: Licensed Clinical Social Worker

## 2021-09-28 DIAGNOSIS — F411 Generalized anxiety disorder: Secondary | ICD-10-CM

## 2021-09-28 DIAGNOSIS — F331 Major depressive disorder, recurrent, moderate: Secondary | ICD-10-CM

## 2021-09-28 NOTE — Progress Notes (Signed)
Virtual Visit via Video Note  I connected with Diane Pacheco on 09/28/21 at 10:00 AM EDT by a video enabled telemedicine application and verified that I am speaking with the correct person using two identifiers.  Location: Patient: [name]'s house Provider: office   I discussed the limitations of evaluation and management by telemedicine and the availability of in person appointments. The patient expressed understanding and agreed to proceed.   I discussed the assessment and treatment plan with the patient. The patient was provided an opportunity to ask questions and all were answered. The patient agreed with the plan and demonstrated an understanding of the instructions.   The patient was advised to call back or seek an in-person evaluation if the symptoms worsen or if the condition fails to improve as anticipated.  I provided 45 minutes of non-face-to-face time during this encounter.  THERAPIST PROGRESS NOTE  Session Time: 10:00 AM to 10:45 AM  Participation Level: Active  Behavioral Response: CasualAlertappropriate  Type of Therapy: Individual Therapy  Treatment Goals addressed: tress management, supportive interventions to help patient be caretaker for family members, emotional regulation for anxiety, anger, depression, strategies to help her in caretaking that will help with stress  ProgressTowards Goals: Progressing-patient utilizing session to process feelings to help with coping, reinforcing positive coping strategies she is using  Interventions: Solution Focused, Strength-based, Supportive, and Reframing  Summary: Diane Pacheco is a 51 y.o. female who presents with 3-4 weeks at [facility] cleaning their garage. In process Bear managed to break another toe, patient bit by wasp. Came down the ladder head first, attic ladder. Swollen three days swollen from hands to shoulders. Ended up going up and getting more done.  Therapist was impressed she went back although  patient said had more protection for herself.  Dumpsters taking by the day and overloaded. They charge a lot. Felt good that finally got it done been needing to done for years. Takes day off works every other day to get a break. Rodman Pickle has his surgery coming up 28th to get the "flappy thing removed". When flares up it scares him paranoid when can't breath. Got antibiotic for sore foot. Not sure if shoes doesn't feel feet so doesn't know. Can't feel anything from knees down. Patient saw his toe the next day he didn't know he had injured because she can't feel, patient goes into panic, worries that there will be an infection is he going to lose toe. Had x-ray send to the orthopedic doctor. Bruising mostly gone down wants to make sure set properly so doesn't heal funky, his toe next to big toe curled because of that, didn't heal right, doesn't need to happen to big toe.  Noted ongoing issues to deal with with medical issues and patient said pick and choose battles.  In general morale ok managing goes home to feed cat and bird doing what can. Braelyn played with them yesterday and was so happy going into 6th grade. Listened to music talked and danced. Daughter "a mess" learning disabilities and hard right now to find work. Explored fun things she has been Licensed conveyancer for fun watching the new Avatar movie. Talked about movies enjoyed with therapist.      Therapist reviewed symptoms, facilitated expression of thoughts and feelings utilized as a treatment intervention to process through feelings related to recent events, stressors.  Explored impact of doing work for [employer] on mood and has not had negative impact therapist also pointed out positive for them to be  helping her out in that can have a positive impact when we are helping others.  Reviewed CBT and solution focused related to stressors noting attending to barriers issues ongoing for example needing to go to the orthopedic doctor today.  Focused on positive things as  well experiences, thinks she is enjoying that her positive focus for impact on mood therapist provided space and support for patient as she talked about thoughts and feelings in session  Suicidal/Homicidal: No  Plan: Return again in  weeks. 2.Therapist work with patient on stress management, grief, coping    Diagnosis: Major Depressive Disorder, recurrent, moderate (history of bipolar) Generalized Anxiety Disorder  Collaboration of Care: Other none needed  Patient/Guardian was advised Release of Information must be obtained prior to any record release in order to collaborate their care with an outside provider. Patient/Guardian was advised if they have not already done so to contact the registration department to sign all necessary forms in order for [place] to release information regarding their care.   Consent: Patient/Guardian gives verbal consent for treatment and assignment of benefits for services provided during this visit. Patient/Guardian expressed understanding and agreed to proceed.   Cordella Register, LCSW 09/28/2021

## 2021-10-15 ENCOUNTER — Ambulatory Visit (INDEPENDENT_AMBULATORY_CARE_PROVIDER_SITE_OTHER): Payer: Medicare Other | Admitting: Licensed Clinical Social Worker

## 2021-10-15 DIAGNOSIS — F331 Major depressive disorder, recurrent, moderate: Secondary | ICD-10-CM | POA: Diagnosis not present

## 2021-10-15 DIAGNOSIS — F411 Generalized anxiety disorder: Secondary | ICD-10-CM

## 2021-10-15 NOTE — Progress Notes (Signed)
Virtual Visit via Video Note  I connected with Diane Pacheco on 10/15/21 at  9:00 AM EDT by a video enabled telemedicine application and verified that I am speaking with the correct person using two identifiers.  Location: Patient: home Provider: home office   I discussed the limitations of evaluation and management by telemedicine and the availability of in person appointments. The patient expressed understanding and agreed to proceed.   I discussed the assessment and treatment plan with the patient. The patient was provided an opportunity to ask questions and all were answered. The patient agreed with the plan and demonstrated an understanding of the instructions.   The patient was advised to call back or seek an in-person evaluation if the symptoms worsen or if the condition fails to improve as anticipated.  I provided 51 minutes of non-face-to-face time during this encounter.  THERAPIST PROGRESS NOTE  Session Time: 9:00 AM to 9:51 AM  Participation Level: Active  Behavioral Response: CasualAlertEuthymic  Type of Therapy: Individual Therapy  Treatment Goals addressed: stress management, supportive interventions to help patient be caretaker for family members, emotional regulation for anxiety, anger, depression, strategies to help her in caretaking that will help with stress  ProgressTowards Goals: Progressing-patient actively uses sessions to work on stress management, coping  Interventions: Solution Focused, Strength-based, Supportive, and Other: Coping  Summary: Diane Pacheco is a 51 y.o. female who presents with [name] had surgery yesterday went well. Did not need a trach tube went beautifully incubated and pulled two nodules one knew about it flapping on wind pipe and a small one on back sent to pathology hope not cancer and he can breath now. Complaining about this for three years. Back at her house. Haven't had time to cut grass. 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  They were so worried about trach tube would have been in for 2-3 weeks and didn't want that. Another sore on neck and more complications. Doctor talk before surgery and felt much better. Waiting on surgery two months a little mind racking. He explained a lot more. He feels so much better that he can breath now just have C-PAP stop leaking will be good. Rough patch with Molly Maduro dying on Mom's birthday and bear's medical issues.  Reviewed symptoms of grief and patient says she realizes she had a year and half to herself when Mom staying with her. Knew it was coming saw her go down a little bit each day. Monday going to see a neurologist about neuropathy in his leg see if there is anything else besides gabapentin try up with the pain so he does not keep running into things breaking his toes. With neuropathy get burning shooting pain horrible pain literally causes pain like somebody has hot iron on feet, hands like on stove burner. He hot nerve damage from back. Glad that someone specializes in neuropathy. Burning on thighs and leg creases from waste down. Addressed wound on his foot. Cancel cable can't afford. Kept the internet. Can pull mom's cable direct TV can stream it which is basically cable. Get her shows back. Gave HBO max. Bear's nephew Selena Batten baby Jean Rosenthal friend hooked with [employer], [employer]. Catching up on shows. Now they are back through mom's cable. Roku how get direct TV. Got back [place] back.  Talked about doing other things fun things like reading and patient says does not have the concentration noted symptoms of spin circles start something and then start something don't finish it.  As noted goal-directed  activity but also that she does accomplish things.  Patient says can be tough when stands there and doesn't know where to start.  Therapist reviewed session patient says she looks forward to our session where she can vent talk about her problems get away from things.  Therapist reviewed symptoms  provided her perspective that things seem to be on an upswing after things have been tough for a while patient agrees.  She shared good news about various surgery that it went well and how big a deal it is for him now that he can breathe easily or again.  Working on his other medical issues and on top in terms of appointments.  Therapist assesses big relief though for him to go have done well with this surgery.  Noted other positive updates patient has her channels back noted this is significant as this is how she spends some of her time so good to have her shows back.  Spent time talking about some of the shows we enjoyed, therapist shared some of her hobbies and said this is a good way to pass time and good for mental health.  Transition to talking about grief therapist appreciated patient's attitude that she realizes she had a year and a half with mom and feels fortunate for that.  Therapist noted very healthy attitude for grieving.  Therapist provided space and support for patient to talk about thoughts and feelings in session.  Suicidal/Homicidal: No  Plan: Return again in 2 weeks.2.Therapist work with patient on stress management, grief, coping   Diagnosis: Major Depressive Disorder, recurrent, moderate (history of bipolar) Generalized Anxiety Disorder  Collaboration of Care: Other none needed  Patient/Guardian was advised Release of Information must be obtained prior to any record release in order to collaborate their care with an outside provider. Patient/Guardian was advised if they have not already done so to contact the registration department to sign all necessary forms in order for [place] to release information regarding their care.   Consent: Patient/Guardian gives verbal consent for treatment and assignment of benefits for services provided during this visit. Patient/Guardian expressed understanding and agreed to proceed.   Coolidge Breeze, LCSW 10/15/2021

## 2021-10-29 ENCOUNTER — Ambulatory Visit (INDEPENDENT_AMBULATORY_CARE_PROVIDER_SITE_OTHER): Payer: Medicare Other | Admitting: Licensed Clinical Social Worker

## 2021-10-29 DIAGNOSIS — F411 Generalized anxiety disorder: Secondary | ICD-10-CM

## 2021-10-29 DIAGNOSIS — F331 Major depressive disorder, recurrent, moderate: Secondary | ICD-10-CM

## 2021-10-29 NOTE — Progress Notes (Signed)
Virtual Visit via Video Note  I connected with Diane Pacheco on 10/29/21 at  9:00 AM EDT by a video enabled telemedicine application and verified that I am speaking with the correct person using two identifiers.  Location: Patient: home Provider: home office   I discussed the limitations of evaluation and management by telemedicine and the availability of in person appointments. The patient expressed understanding and agreed to proceed.   I discussed the assessment and treatment plan with the patient. The patient was provided an opportunity to ask questions and all were answered. The patient agreed with the plan and demonstrated an understanding of the instructions.   The patient was advised to call back or seek an in-person evaluation if the symptoms worsen or if the condition fails to improve as anticipated.  I provided 45 minutes of non-face-to-face time during this encounter.  THERAPIST PROGRESS NOTE  Session Time: 9:00 AM to 9:45 AM  Participation Level: Active  Behavioral Response: CasualAlertAngry and Anxious  Type of Therapy: Individual Therapy  Treatment Goals addressed: stress management, supportive interventions to help patient be caretaker for family members, emotional regulation for anxiety, anger, depression, strategies to help her in caretaking that will help with stress  ProgressTowards Goals: Progressing-assessed very helpful for patient to have an outlet to process feelings for coping for supportive interventions in managing significant stressors therapist encouraging helpful ways of coping  Interventions: Solution Focused, Strength-based, Supportive, and Other: coping  Summary: Diane Pacheco is a 51 y.o. female who presents with "God is trying to kill me". Results from pathology and [name] has throat cancer. This along with Laney Potash has spinal cancer. Dad has dementia and she has a walker and can barely walk and in a lot of pain.  Does not like MyChart  anymore because found out before doctor called.  Therapist agreed that in some ways having this information without having and explained not always the best way to get the information.  The doctor said he was going to get on that really quick a couple weeks go by and not hearing anything. PCP told her no orders put in. Seeing the surgeon on Monday and tell him off because he was supposed get going. Bear not mad can breathe. Didn't tell family no idea what is going on. Bear working day and day out on projects. Before because of thing in throat sweat profusely going outside now doesn't sweat. He is fantastically happy on this point. He was complaining about the thing in his throat for 4 years. Brushed it off at the office. Patient says if they had taken off sooner could be pre-cancerous. Blamed on heartburn. Got so bad that they had to do something. Test tube and incubated before doing anything. Probably will get radiation on [place] and afraid of ramifications. Drinking and smoking herself to death. Worries about the worst worry if metastasized. CT scan try to get done on Sunday. Her doctor called surgeon to get test ordered. Neurologist for neuropathy think he might have smooched spinal column MRI on his back Friday. Happier once tests talk to people and figure out what is going on until then will be antsy edge of seat and ill. Planted tomato plants. Showed therapist her lamb figurines in a yard.  Therapist thought they were great he has been busy. Does her work and projects. Bear made a hanging basket with flowers. [name] is very creative dad was Curator and helped him. He is handy. Knows the table is coming next. Has  to put the legs put on.  Therapist feedback was appreciation for both his talents and focus on project is positive.  Talked about how they would like to do a trip to [facility].  Note these positive subjects assess helpful for patient help to downsize stress when possible  Patient has had ongoing  stressors process feelings related to recent wounds and assess helpful for patient to have a placed to release emotion, benefits from supportive and strength-based intervention.  Validated patient on how the brain works and can worry about things but at the same time try to remind herself she does not know what she is looking at so wait till she finds that more news but hard.  Noted Bears way of coping very helpful for situation related to being able to breathe and then understandably really can positively impact mood but therapist glad for him as well as impact on patient can have a good impact.  When he is able to have patient focus on constructive activities which are also distracting helpful for patient's anxiety.  Felt positive to focus on some of the projects he is working on, enjoyed some of the things she showed therapist noted to have talented [name] is an assess helpful to focus on some of the positive activities they are engaged in.  Therapist provided positive feedback at the same time for patient's proactive steps to get the doctors to get moving on most recent news noting [name] fortunate to have her to get medical care moving for him.  Therapist provided space and support for patient to talk about thoughts and feelings in session  Suicidal/Homicidal: No  Plan: Return again in 2 weeks.2.Therapist work with patient on stress management, grief, coping    Diagnosis: Major Depressive Disorder, recurrent, moderate (history of bipolar) Generalized Anxiety Disorder  Collaboration of Care: Other none needed  Patient/Guardian was advised Release of Information must be obtained prior to any record release in order to collaborate their care with an outside provider. Patient/Guardian was advised if they have not already done so to contact the registration department to sign all necessary forms in order for [place] to release information regarding their care.   Consent: Patient/Guardian gives verbal consent for  treatment and assignment of benefits for services provided during this visit. Patient/Guardian expressed understanding and agreed to proceed.   Coolidge Breeze, LCSW 10/29/2021

## 2021-11-03 ENCOUNTER — Other Ambulatory Visit: Payer: Self-pay | Admitting: Family Medicine

## 2021-11-03 DIAGNOSIS — Z1231 Encounter for screening mammogram for malignant neoplasm of breast: Secondary | ICD-10-CM

## 2021-11-12 ENCOUNTER — Ambulatory Visit (INDEPENDENT_AMBULATORY_CARE_PROVIDER_SITE_OTHER): Payer: Medicare Other | Admitting: Licensed Clinical Social Worker

## 2021-11-12 DIAGNOSIS — F411 Generalized anxiety disorder: Secondary | ICD-10-CM

## 2021-11-12 DIAGNOSIS — F331 Major depressive disorder, recurrent, moderate: Secondary | ICD-10-CM

## 2021-11-12 NOTE — Progress Notes (Signed)
Virtual Visit via Video Note  I connected with Diane Pacheco on 11/12/21 at  9:00 AM EDT by a video enabled telemedicine application and verified that I am speaking with the correct person using two identifiers.  Location: Patient: home Provider: home office   I discussed the limitations of evaluation and management by telemedicine and the availability of in person appointments. The patient expressed understanding and agreed to proceed.   I discussed the assessment and treatment plan with the patient. The patient was provided an opportunity to ask questions and all were answered. The patient agreed with the plan and demonstrated an understanding of the instructions.   The patient was advised to call back or seek an in-person evaluation if the symptoms worsen or if the condition fails to improve as anticipated.  I provided 52 minutes of non-face-to-face time during this encounter.  THERAPIST PROGRESS NOTE  Session Time: 9:00 AM to 9:52 AM  Participation Level: Active  Behavioral Response: CasualAlertAnxious and appropriate in session  Type of Therapy: Individual Therapy  Treatment Goals addressed:  stress management, supportive interventions to help patient be caretaker for family members, emotional regulation for anxiety, anger, depression, strategies to help her in caretaking that will help with stress  ProgressTowards Goals: Progressing-helpful for patient to have an outlet for processing feelings to help work through and cope with stressors  Interventions: Solution Focused, Strength-based, Supportive, and Other: Being  Summary: Diane Pacheco is a 51 y.o. female who presents with busy. Diane Pacheco worried about getting up and out of house 7 weeks for radiation. Doing radiation five days a week.  Therapist explored prognosis and they say if do quickly hoping for better prognosis. Not needing chemo therapy as a good sign. Hard to get him out already twice a month with his back  and legs not working. Week of August 7 starting. Not able to go to vacation but rather have 15 more vacations with him.noted having some assistance devices would help and patient says have stairs and have system to get up and down stairs he can walk not that well. Have scooter chairs. Even between now and then have an appointment every day. Friday CT stimulation make mask for radiation. Worried about side effects especially on larynx. Hoping will not be too severe but just delicate part of the body. There is going to be some hardening of larynx hard to swallow, will have a chemical burn scar tissue up, taste change, salvia have to suction out. Hard to swallow. Have to meet with the nutritionist and speech therapist each week on top of the radiation. Has appointment after appointment.  People on the team include surgeon, radiation oncologist, cancer coordinator. Diane Pacheco going to [facility] in the water. Patient going to use the gym as well. Might as well since getting radiation go to [facility] 2-3 times a week more movement the decrease of side effects. Diane Pacheco has 35 radiation sessions. Worried about thick mucous because of C-PAP may need a suction machine to pull out goop.On top of house work shopping his basic care adding so much more. Hardest to tell mom his sister treated with cancer dealing with it for years metastasize all over. Now find her son has to be hard on her. Want him to stop drinking and smoking. Diane Pacheco threw patient on the board. The smoking is what caused the cancer make the side effects worse.  He told the doctors he would quit but patient makes it harder with her smoking. Patient started crying. Patient  said full anxiety no cigarettes put on medication can't function why took kids taken away. Gets anxiety thinking about it. Knows if smoking will end up where he is at. Knows going to have to stop but now going through a hard time. He has to slowly cut down on drinking.     Therapist utilized session for patient to  have space and support to talk about thoughts and feelings particularly helpful for patient as she faces new stressor of her long-term partner having to get radiation.  Noted patient herself finding ways to lighten situation and assess helpful for coping.  Laughing about things like going to the gym starting to get involved with that therapist noted out of this they are going to turn out to be healthy.  Noted positive achievement of getting to the gym as this was something patient had wanted to have have been.  That if they will go 2 or 3 times a week though will be hard with patient also getting radiation.  Noted patient again fulfilling caretaker role which has been the focus for treatment this time her partner and fortunate she can take on this role.  As noted positive that he has her as a support to turn to.  Noted asking her to get off cigarettes and drinking talked about the challenge even though she knows it would be good for her.  Spent session looking at some of the finer aspects of situation assess helpful for coping and mood especially when dealing with something so stressful.  Reviewed treatment plan patient gave consent to complete virtually  Suicidal/Homicidal: No  Plan: Return again in 2 weeks.2.Therapist work with patient on stress management, grief, coping   Diagnosis: Major Depressive Disorder, recurrent, moderate (history of bipolar) Generalized Anxiety Disorder  Collaboration of Care: Other none needed  Patient/Guardian was advised Release of Information must be obtained prior to any record release in order to collaborate their care with an outside provider. Patient/Guardian was advised if they have not already done so to contact the registration department to sign all necessary forms in order for Diane Pacheco to release information regarding their care.   Consent: Patient/Guardian gives verbal consent for treatment and assignment of benefits for services provided during this visit.  Patient/Guardian expressed understanding and agreed to proceed.   Diane Breeze, LCSW 11/12/2021

## 2021-11-19 ENCOUNTER — Ambulatory Visit
Admission: RE | Admit: 2021-11-19 | Discharge: 2021-11-19 | Disposition: A | Discharge status: Home / Self Care | Payer: Medicare Other | Source: Ambulatory Visit | Attending: Family Medicine | Admitting: Family Medicine

## 2021-11-19 DIAGNOSIS — Z1231 Encounter for screening mammogram for malignant neoplasm of breast: Secondary | ICD-10-CM

## 2021-11-26 ENCOUNTER — Telehealth (INDEPENDENT_AMBULATORY_CARE_PROVIDER_SITE_OTHER): Payer: Medicare Other | Admitting: Licensed Clinical Social Worker

## 2021-11-26 DIAGNOSIS — F411 Generalized anxiety disorder: Secondary | ICD-10-CM

## 2021-11-26 DIAGNOSIS — F331 Major depressive disorder, recurrent, moderate: Secondary | ICD-10-CM

## 2021-11-26 NOTE — Progress Notes (Signed)
Virtual Visit via Video Note  I connected with Diane Pacheco on 11/26/21 at  9:00 AM EDT by a video enabled telemedicine application and verified that I am speaking with the correct person using two identifiers.  Location: Patient: home Provider: home office   I discussed the limitations of evaluation and management by telemedicine and the availability of in person appointments. The patient expressed understanding and agreed to proceed.  I discussed the assessment and treatment plan with the patient. The patient was provided an opportunity to ask questions and all were answered. The patient agreed with the plan and demonstrated an understanding of the instructions.   The patient was advised to call back or seek an in-person evaluation if the symptoms worsen or if the condition fails to improve as anticipated.  I provided 52 minutes of non-face-to-face time during this encounter.  THERAPIST PROGRESS NOTE  Session Time: 9:00 AM to 9:52 AM  Participation Level: Active  Behavioral Response: CasualAlertAnxious appropriate in session  Type of Therapy: Individual Therapy  Treatment Goals addressed:   stress management, supportive interventions to help patient be caretaker for family members, emotional regulation for anxiety, anger, depression, strategies to help her in caretaking that will help with stress  ProgressTowards Goals: Progressing-utilize session to process feelings to help with coping working on stressors of being a caretaker, coping for stressors  Interventions: Solution Focused, Strength-based, Supportive, and Other: coping  Summary: Diane Pacheco is a 51 y.o. female who presents with started the radiation on Monday. Last 15 minutes[facility] Getting nicotine patches. Read the literature best that he quit smoking will be torture if don't because Larynx will be raw and sore putting irritates on top make it worse. Feels needs to be supportive of  him so plans to quit also. For him it is habit not addiction he can quit anything. For patient is an addiction. Find edibles marijuana helps with anxiety and calms down. People take it for cancer treatment all the time. He is not going to want eat. Something make him hungrier to get food him in. Ordered the edibles early see what works and not work. Right now he has slight sore throat because of smoking. All her doctors know smoke marijuana three times a day and doctors know. Helps anxiety better than strung out on benzos was on those. Has to drive so smoking at night. Afraid if he can't not smoke or swallow give him gummies may help. Keep throwing her literature and wants to read everything. Patient likes to learn. 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 Thunderstorm stood on porch and cried Mom loved thunderstorm needed it between him and everything else going on. Every once in awhile realizes need a good cry to get out. Noted patient's life right now one problem to another. Feels like dealing with it- all can do can take one day at a time hope that the day will be able to easy as the day before. Talk to sister nice and supportive weird Bear in cancer camp with her. Talk about something can't fight about. Right now will take it. Also positive Diane Pacheco got a job [place] glad she got a job. Sister wanted her to say expression to help her not my circus not my monkeys.  Transition to assessment how she is doing patient says hopes the side effects not too bad. Enjoy working out. Yesterday started while he is doing 10 minutes patient doing walking laps. When diagnosed over medicated  how lost children why don't want to take  medication. After the gym came home he had beers patient did dishes. He attends his little garden has tomato. Locked in room for years can't breath now nodule cut out breathing better. Therapist changed noted significant impact of his quality of his life. Therapist pointed out positive impact on patient with  doing his doing positive activities. He can do more things on his own gives patient freedom. Talked about cat, Diane Pacheco with quirkiness.               Therapist provided space and support for patient to talk about thoughts and feelings in session stressors of patient's significant other dealing with cancer treatment.  Was able to reframe about some things including both of them starting at the gym as a positive, long-term positive effects of stopping smoking even though a challenge.  Therapist noted patient's positive quality of learning everything she can and being prepared is very helpful as a support.  Noted some coping strategies helpful including taking 1 day at a time getting through the day not as bad as thought and hope the next day will be the same.  Therapist noted basic going to the gym is helpful for mental health, that there are activities that help with mood releasing natural chemicals going to the gym is 1 of those.  Therapist mentioned other ways that help with mood such as pointing out things were grateful for.  Noted another positive reframe that Diane Pacheco has more mobility and noted positive impact it has on patient to see he is doing better in this way he is able to gauge in activities that help his wellbeing and mood. Suicidal/Homicidal: No  Plan: Return again in 2 weeks.2.Therapist work with patient on stress management, grief, coping    Diagnosis: Major Depressive Disorder, recurrent, moderate (history of bipolar) Generalized Anxiety Disorder   Collaboration of Care: Other none needed  Patient/Guardian was advised Release of Information must be obtained prior to any record release in order to collaborate their care with an outside provider. Patient/Guardian was advised if they have not already done so to contact the registration department to sign all necessary forms in order for [place] to release information regarding their care.   Consent: Patient/Guardian gives verbal consent for  treatment and assignment of benefits for services provided during this visit. Patient/Guardian expressed understanding and agreed to proceed.   Coolidge Breeze, LCSW 11/26/2021

## 2021-12-04 ENCOUNTER — Ambulatory Visit: Payer: Medicare Other | Admitting: Family Medicine

## 2021-12-10 ENCOUNTER — Ambulatory Visit (HOSPITAL_COMMUNITY): Payer: Medicare Other | Admitting: Licensed Clinical Social Worker

## 2021-12-24 ENCOUNTER — Ambulatory Visit: Payer: Medicare Other | Admitting: Family Medicine

## 2021-12-30 ENCOUNTER — Encounter: Payer: Self-pay | Admitting: Family Medicine

## 2021-12-30 ENCOUNTER — Ambulatory Visit: Payer: Medicare Other | Admitting: Family Medicine

## 2021-12-30 VITALS — BP 151/102 | HR 74 | Ht 65.0 in | Wt 170.2 lb

## 2021-12-30 DIAGNOSIS — Z0000 Encounter for general adult medical examination without abnormal findings: Secondary | ICD-10-CM | POA: Diagnosis not present

## 2021-12-30 DIAGNOSIS — Z1159 Encounter for screening for other viral diseases: Secondary | ICD-10-CM | POA: Diagnosis not present

## 2021-12-30 DIAGNOSIS — Z114 Encounter for screening for human immunodeficiency virus [HIV]: Secondary | ICD-10-CM | POA: Diagnosis not present

## 2021-12-30 DIAGNOSIS — E782 Mixed hyperlipidemia: Secondary | ICD-10-CM | POA: Diagnosis not present

## 2021-12-30 DIAGNOSIS — J449 Chronic obstructive pulmonary disease, unspecified: Secondary | ICD-10-CM

## 2021-12-30 MED ORDER — ALBUTEROL SULFATE HFA 108 (90 BASE) MCG/ACT IN AERS: 1.0000 | INHALATION_SPRAY | Freq: Four times a day (QID) | RESPIRATORY_TRACT | 2 refills | Status: DC | PRN

## 2021-12-30 NOTE — Patient Instructions (Addendum)
It was nice seeing you today!  Blood tests today.  Get your flu shot here or at your local pharmacy.  Call us when you are ready to get set up for colonoscopy.  Schedule pap smear whenever it a good time for you.  Work on cutting back on smoking.  Stay well, Diane Deeds, MD [facility] [phone]  --  Make sure to check out at the front desk before you leave today.  Please arrive at least 15 minutes prior to your scheduled appointments.  If you had blood work today, I will send you a MyChart message or a letter if results are normal. Otherwise, I will give you a call.  If you had a referral placed, they will call you to set up an appointment. Please give [place] a call if you don't hear back in the next 2 weeks.  If you need additional refills before your next appointment, please call your pharmacy first.

## 2021-12-30 NOTE — Progress Notes (Signed)
Subjective:   Diane Pacheco is a 51 y.o. female who presents for Medicare Annual (Subsequent) preventive examination.  Significant other is currently going through radiation for head and neck cancer. She is only using albuterol as needed for COPD about once a week. Stopped her controller medications due to side effects (tachycardia). Going to therapy regularly. Not on any psychotropic medications and does not see a psychiatrist. She is on disabilities for mental health disorder.  Review of Systems:  Review of Systems  Constitutional:  Negative for chills, fever, malaise/fatigue and weight loss.  HENT:  Negative for congestion, ear pain, hearing loss and sore throat.   Eyes:  Negative for blurred vision, double vision and pain.  Respiratory:  Positive for cough and sputum production. Negative for hemoptysis, shortness of breath and wheezing.   Cardiovascular:  Positive for leg swelling. Negative for chest pain, orthopnea and PND.  Gastrointestinal:  Negative for abdominal pain, blood in stool, constipation, diarrhea, heartburn, melena, nausea and vomiting.  Genitourinary:  Negative for dysuria, frequency and hematuria.  Musculoskeletal:  Negative for back pain, falls, joint pain, myalgias and neck pain.  Skin:  Negative for rash.  Neurological:  Negative for dizziness, tingling, sensory change, focal weakness, weakness and headaches.  Psychiatric/Behavioral:  Positive for depression and substance abuse. Negative for hallucinations and suicidal ideas. The patient is nervous/anxious. The patient does not have insomnia.     Cardiac Risk Factors include: smoking/ tobacco exposure;sedentary lifestyle;family history of premature cardiovascular disease;dyslipidemia     Objective:     Vitals: BP (!) 151/91   Pulse 74   Ht 5\' 5"  (1.651 m)   Wt 170 lb 3.2 oz (77.2 kg)   SpO2 98%   BMI 28.32 kg/m   Body mass index is 28.32 kg/m.     01/25/2020    9:12 AM 12/10/2019    1:56 PM  06/15/2019   10:37 AM 04/03/2018    9:09 AM 02/06/2018    2:32 PM 08/23/2017   10:17 AM 08/17/2017    1:59 PM  Advanced Directives  Does Patient Have a Medical Advance Directive? No No No No No No No  Would patient like information on creating a medical advance directive? No - Patient declined No - Patient declined No - Patient declined No - Patient declined No - Patient declined No - Patient declined No - Patient declined    Tobacco Social History   Tobacco Use  Smoking Status Every Day   Packs/day: 1.00   Types: Cigarettes  Smokeless Tobacco Never  Tobacco Comments   Started smoking at age 75     Ready to quit: No Counseling given: Not Answered Tobacco comments: Started smoking at age 64   Clinical Intake:              How often do you need to have someone help you when you read instructions, pamphlets, or other written materials from your doctor or pharmacy?: 1 - Never  Interpreter Needed?: No     Past Medical History:  Diagnosis Date   Asthma    exacerbations w/ respiratory infxns. Well controlled w/ abuterol PRN   Condyloma acuminatum    COPD (chronic obstructive pulmonary disease) (HCC)    Major depression    Unable to take antidepressents due to intolerance. Prefers to handle w/o Rx   Past Surgical History:  Procedure Laterality Date   TUBAL LIGATION     Family History  Problem Relation Age of Onset   Cancer Mother  breast cancer at 67yo   Heart attack Mother        late 64s    COPD Mother    Dementia Father    Breast cancer Maternal Grandmother        around 72yo   Breast cancer Paternal Grandmother        around 55yo   Social History   Socioeconomic History   Marital status: Significant Other    Spouse name: Not on file   Number of children: 3   Years of education: 72   Highest education level: 11th grade  Occupational History   Occupation: disabled  Tobacco Use   Smoking status: Every Day    Packs/day: 1.00    Types:  Cigarettes   Smokeless tobacco: Never   Tobacco comments:    Started smoking at age 79  Vaping Use   Vaping Use: Never used  Substance and Sexual Activity   Alcohol use: Yes    Alcohol/week: 4.0 standard drinks of alcohol    Types: 4 Cans of beer per week    Comment: weekly weekends per pt, hard lemonade   Drug use: Yes    Types: Marijuana    Comment: pt states daily use   Sexual activity: Not Currently    Birth control/protection: Surgical  Other Topics Concern   Not on file  Social History Narrative   Lives in house with significant other. One level house, no trouble with stairs, has smoke alarms. No throw rugs on floor.   Has a cat, dog, bird, and fish.      Eat meat, vegetables, does not eat fruit. Likes to drink milk.   No real hobbies but likes to play pool. Also enjoys cooking.      Always wears seat belt.      Social Determinants of Health   Financial Resource Strain: Low Risk  (04/03/2018)   Overall Financial Resource Strain (CARDIA)    Difficulty of Paying Living Expenses: Not hard at all  Food Insecurity: No Food Insecurity (04/03/2018)   Hunger Vital Sign    Worried About Running Out of Food in the Last Year: Never true    Ran Out of Food in the Last Year: Never true  Transportation Needs: No Transportation Needs (04/03/2018)   PRAPARE - Administrator, Civil Service (Medical): No    Lack of Transportation (Non-Medical): No  Physical Activity: Inactive (04/03/2018)   Exercise Vital Sign    Days of Exercise per Week: 0 days    Minutes of Exercise per Session: 0 min  Stress: No Stress Concern Present (04/03/2018)   [employer] of Occupational Health - Occupational Stress Questionnaire    Feeling of Stress : Not at all  Social Connections: Somewhat Isolated (04/03/2018)   Social Connection and Isolation Panel [NHANES]    Frequency of Communication with Friends and Family: More than three times a week    Frequency of Social Gatherings with  Friends and Family: Once a week    Attends Religious Services: Never    Database administrator or Organizations: No    Attends Banker Meetings: Never    Marital Status: Living with partner    Outpatient Encounter Medications as of 12/30/2021  Medication Sig   albuterol (VENTOLIN HFA) 108 (90 Base) MCG/ACT inhaler Inhale 1-2 puffs into the lungs every 6 (six) hours as needed for wheezing or shortness of breath. INHALE ONE TO TWO PUFFS BY MOUTH EVERY 6 HOURS AS NEEDED FOR  WHEEZING AND FOR SHORTNESS OF BREATH   atorvastatin (LIPITOR) 20 MG tablet TAKE ONE TABLET BY MOUTH ONCE DAILY (Patient not taking: No sig reported)   Spacer/Aero-Holding Rudean Curt Use with inhaler as instructed   tiotropium (SPIRIVA) 18 MCG inhalation capsule Place 1 capsule (18 mcg total) into inhaler and inhale every morning. (Patient not taking: Reported on 12/30/2021)   umeclidinium bromide (INCRUSE ELLIPTA) 62.5 MCG/INH AEPB Inhale 1 puff into the lungs every morning. (Patient not taking: Reported on 12/30/2021)   [DISCONTINUED] cyclobenzaprine (FLEXERIL) 5 MG tablet Take 1 tablet (5 mg total) by mouth 3 (three) times daily as needed for muscle spasms. (Patient not taking: Reported on 12/30/2021)   [DISCONTINUED] doxycycline (VIBRAMYCIN) 100 MG capsule Take 1 capsule (100 mg total) by mouth 2 (two) times daily. (Patient not taking: Reported on 12/30/2021)   [DISCONTINUED] Foot Care Products (BUNION GUARD) PADS 1 each by Does not apply route as needed. (Patient not taking: Reported on 04/03/2018)   [DISCONTINUED] Lidocaine HCl 2 % CREA Apply 1 Squirt topically 3 (three) times daily as needed. (Patient not taking: Reported on 12/30/2021)   No facility-administered encounter medications on file as of 12/30/2021.    Activities of Daily Living    12/30/2021    9:26 AM  In your present state of health, do you have any difficulty performing the following activities:  Hearing? 0  Vision? 0  Difficulty  concentrating or making decisions? 0  Walking or climbing stairs? 0  Dressing or bathing? 0  Doing errands, shopping? 0  Preparing Food and eating ? N  Using the Toilet? N  In the past six months, have you accidently leaked urine? Y  Comment with coughing, has had physical therapy before  Do you have problems with loss of bowel control? N  Managing your Medications? N  Managing your Finances? N  Housekeeping or managing your Housekeeping? Y  Comment due to distractability    Patient Care Team: Littie Deeds, MD as PCP - General (Family Medicine) Mateo Flow, MD as Consulting Physician (Ophthalmology) Kerin Salen, MD as Consulting Physician (Gastroenterology) Karie Soda, MD as Consulting Physician (General Surgery)    Assessment:   This is a routine wellness examination for [facility].  Exercise Activities and Dietary recommendations Current Exercise Habits: Home exercise routine (walking 3 times a week at the [facility]), Type of exercise: walking, Time (Minutes): 20, Frequency (Times/Week): 3, Weekly Exercise (Minutes/Week): 60, Intensity: Moderate, Exercise limited by: None identified   Goals      Patient Stated     " I don't have any goals."         Fall Risk    12/30/2021    9:24 AM 06/15/2019   10:38 AM 09/22/2018   10:10 AM 04/03/2018    9:10 AM  Fall Risk   Falls in the past year? 0 0 0 0  Number falls in past yr:  0 0   Injury with Fall?  0    Follow up   Falls evaluation completed    Is the patient's home free of loose throw rugs in walkways, pet beds, electrical cords, etc?   yes      Grab bars in the bathroom? yes for significant other      Handrails on the stairs?    N/a      Adequate lighting?   yes  Patient rating of health (0-10) scale: 8   Depression Screen    12/30/2021    9:25 AM 01/25/2020  9:12 AM 12/10/2019    1:57 PM 06/15/2019   10:38 AM  PHQ 2/9 Scores  PHQ - 2 Score 1 2 4 2   PHQ- 9 Score  6 10 7      Cognitive Function    04/03/2018     9:13 AM  MMSE - Mini Mental State Exam  Orientation to time 5  Orientation to Place 5  Registration 3  Attention/ Calculation 5  Recall 3  Language- name 2 objects 2  Language- repeat 1  Language- follow 3 step command 3  Language- read & follow direction 1  Write a sentence 1  Copy design 1  Total score 30        12/30/2021    9:32 AM 04/03/2018    9:13 AM  6CIT Screen  What Year? 0 points 0 points  What month? 0 points 0 points  What time? 0 points 0 points  Count back from 20 0 points 0 points  Months in reverse 2 points 0 points  Repeat phrase 2 points 0 points  Total Score 4 points 0 points    Immunization History  Administered Date(s) Administered   Influenza Whole 01/11/2008   Influenza,inj,Quad PF,6+ Mos 06/19/2013, 01/07/2015, 04/14/2016, 02/06/2018   Influenza-Unspecified 01/05/2020   PFIZER(Purple Top)SARS-COV-2 Vaccination 07/18/2019, 08/09/2019, 04/03/2020   PNEUMOCOCCAL CONJUGATE-20 11/20/2020   Td 05/21/1999   Tdap 06/16/2017     Screening Tests Health Maintenance  Topic Date Due   HIV Screening  Never done   Hepatitis C Screening  Never done   COLONOSCOPY (Pts [id number] Insurance coverage will need to be confirmed)  Never done   PAP SMEAR-Modifier  04/28/2019   COVID-19 Vaccine (4 - Pfizer series) 05/29/2020   INFLUENZA VACCINE  11/17/2021   MAMMOGRAM  11/20/2022   TETANUS/TDAP  06/17/2027   HPV VACCINES  Aged Out    Cancer Screenings: Lung: Low Dose CT Chest recommended if Age 51-80 years, 20 pack-year currently smoking OR have quit w/in 15years. Patient does not qualify. Breast:  Up to date on Mammogram? Yes   Up to date of Bone Density/Dexa? Yes Colorectal: due, counseling provided - patient will hold off for now due to significant other's health issues and will give us a call when she is ready for GI referral for colonoscopy  Additional Screenings: : Hepatitis C Screening: ordered HIV screening: ordered     Plan:    Medicare  annual wellness visit, subsequent - Plan: Basic Metabolic Panel  Mixed hyperlipidemia - Plan: Lipid Panel  Encounter for screening for HIV - Plan: HIV antibody (with reflex)  Encounter for hepatitis C screening test for low risk patient - Plan: Hepatitis C antibody (reflex, frozen specimen)  Chronic obstructive pulmonary disease, unspecified COPD type (HCC) - Plan: albuterol (VENTOLIN HFA) 108 (90 Base) MCG/ACT inhaler    I have personally reviewed and noted the following in the patient's chart:   Medical and social history Use of alcohol, tobacco or illicit drugs  Current medications and supplements Functional ability and status Nutritional status Physical activity Advanced directives List of other physicians Hospitalizations, surgeries, and ER visits in previous 12 months Vitals Screenings to include cognitive, depression, and falls Referrals and appointments  In addition, I have reviewed and discussed with patient certain preventive protocols, quality metrics, and best practice recommendations. A written personalized care plan for preventive services as well as general preventive health recommendations were provided to patient.    This visit was conducted virtually in the setting of the COVID19 pandemic.  Littie Deeds, MD  12/30/2021

## 2021-12-31 LAB — BASIC METABOLIC PANEL
BUN/Creatinine Ratio: 10 (ref 9–23)
BUN: 8 mg/dL (ref 6–24)
CO2: 24 mmol/L (ref 20–29)
Calcium: 10 mg/dL (ref 8.7–10.2)
Chloride: 100 mmol/L (ref 96–106)
Creatinine, Ser: 0.83 mg/dL (ref 0.57–1.00)
Glucose: 103 mg/dL — ABNORMAL HIGH (ref 70–99)
Potassium: 5.3 mmol/L — ABNORMAL HIGH (ref 3.5–5.2)
Sodium: 140 mmol/L (ref 134–144)
eGFR: 87 mL/min/{1.73_m2} (ref >59)

## 2021-12-31 LAB — LIPID PANEL
Chol/HDL Ratio: 4.7 ratio — ABNORMAL HIGH (ref 0.0–4.4)
Cholesterol, Total: 193 mg/dL (ref 100–199)
HDL: 41 mg/dL (ref >39)
LDL Chol Calc (NIH): 101 mg/dL — ABNORMAL HIGH (ref 0–99)
Triglycerides: 300 mg/dL — ABNORMAL HIGH (ref 0–149)
VLDL Cholesterol Cal: 51 mg/dL — ABNORMAL HIGH (ref 5–40)

## 2021-12-31 LAB — HIV ANTIBODY (ROUTINE TESTING W REFLEX): HIV Screen 4th Generation wRfx: NONREACTIVE

## 2021-12-31 LAB — HCV INTERPRETATION

## 2021-12-31 LAB — HCV AB W REFLEX TO QUANT PCR: HCV Ab: NONREACTIVE

## 2022-01-21 ENCOUNTER — Ambulatory Visit (HOSPITAL_COMMUNITY): Payer: Medicare Other | Admitting: Licensed Clinical Social Worker

## 2022-01-26 ENCOUNTER — Ambulatory Visit (INDEPENDENT_AMBULATORY_CARE_PROVIDER_SITE_OTHER): Payer: Medicare Other | Admitting: Family Medicine

## 2022-01-26 ENCOUNTER — Ambulatory Visit (HOSPITAL_COMMUNITY)
Admission: RE | Admit: 2022-01-26 | Discharge: 2022-01-26 | Disposition: A | Discharge status: Home / Self Care | Payer: Medicare Other | Source: Ambulatory Visit | Attending: Family Medicine | Admitting: Family Medicine

## 2022-01-26 ENCOUNTER — Encounter: Payer: Self-pay | Admitting: Family Medicine

## 2022-01-26 VITALS — BP 138/89 | HR 96 | Ht 65.0 in | Wt 171.6 lb

## 2022-01-26 DIAGNOSIS — R7309 Other abnormal glucose: Secondary | ICD-10-CM | POA: Diagnosis not present

## 2022-01-26 DIAGNOSIS — F17200 Nicotine dependence, unspecified, uncomplicated: Secondary | ICD-10-CM

## 2022-01-26 DIAGNOSIS — R200 Anesthesia of skin: Secondary | ICD-10-CM | POA: Diagnosis not present

## 2022-01-26 DIAGNOSIS — R202 Paresthesia of skin: Secondary | ICD-10-CM

## 2022-01-26 DIAGNOSIS — E782 Mixed hyperlipidemia: Secondary | ICD-10-CM | POA: Diagnosis not present

## 2022-01-26 DIAGNOSIS — R42 Dizziness and giddiness: Secondary | ICD-10-CM | POA: Diagnosis not present

## 2022-01-26 DIAGNOSIS — Z23 Encounter for immunization: Secondary | ICD-10-CM

## 2022-01-26 LAB — POCT GLYCOSYLATED HEMOGLOBIN (HGB A1C): Hemoglobin A1C: 5.3 % (ref 4.0–5.6)

## 2022-01-26 NOTE — Assessment & Plan Note (Addendum)
New onset, worsening in frequency.  Quite concerning for cardiac etiology.  EKG in clinic today without ST elevation.  Differential is broad but most importantly still includes NSTEMI and CAD.  A1c collected today for risk stratification, found to be normal.  Strong family history of CAD, her mother had 2 stents by this age.  Urgent referral to cardiology.  Strict ED precautions given; if she develops the symptoms again, she is to go to the ED immediately.  She relates she is quite determined to go out of town with her husband this weekend (as he just finished radiation treatments).  I discussed that this may not be the best idea and if she were to still go out of town, I want her to know exactly where the hospital is at her destination.  Patient voices understanding and is amenable to this plan.

## 2022-01-26 NOTE — Patient Instructions (Addendum)
It was wonderful to see you today. Thank you for allowing me to be a part of your care. Below is a short summary of what we discussed at your visit today:  Intermittent left arm and jaw tingling Today we got an EKG which showed no obvious signs of heart attack.  There are however other kinds of heart attack that do not show up on the EKG.  These are called NSTEMI.  I referred you urgently to cardiology.  Somebody should be calling you in 1 to 2 days to make an appointment, if they do not please call us and let us know by Thursday afternoon.  IF you begin to experience the symptoms again, I want you to go STRAIGHT to the emergency room to be evaluated.  If you are absolutely set on going out of town this weekend, I would make sure you know where the hospital is at your destination, just in case you have the symptoms again.  If you have any questions or concerns, please do not hesitate to contact us via phone or MyChart message.   Ezequiel Essex, MD

## 2022-01-26 NOTE — Progress Notes (Addendum)
SUBJECTIVE:   CHIEF COMPLAINT / HPI:   Arm tingling, jaw discomfort, fatigue Ms. Diane Pacheco is a pleasant 51 year old female who presents today with a 3-week history of intermittent left arm and jaw tingling plus fatigue and sometimes dizziness.  She reports the last 3 weeks, she has been getting progressively more frequent episodes of this tingling.  Now occurring every other day without aggravating factors, can be at rest. The tingling will start over her left bicep, radiate down to elbow, and radiate up through shoulder to jaw.  Says during episodes she "just does not feel right". During these episodes, she has no shortness of breath or chest pain. During episodes, she checks BP 130s/mid-80s (normal for her), pulse 90-100 (normal for her).  She is quite concerned about this being cardiac etiology, as her mother by this age already had two stents.  She does have a history of bipolar depression and anxiety, but reports that these tingling episodes feel completely different than her anxiety episodes.  She can walk several blocks without chest pain, SOB, or onset of these symptoms.  Tobacco use - 1 PPD cigarettes since 51 yo (35 pack-year history) ETOH - socially  Marijuana for anxiety - daily, 2 small joints Crack cocaine "years ago", stopped 10-12 years ago No illicit drugs currently; nothing snorted, smoked, ingested, or injected  Hx COPD - has as needed albuterol inhaler, last used 3 days ago Two daily inhalers, but not using because they make anxiety worse  Lipid panel below, please note she was not fasting at the time of this lab so triglycerides are not reliable.  LDL 101, she is not currently taking her statin.  Lab Results  Component Value Date   CHOL 193 12/30/2021   HDL 41 12/30/2021   LDLCALC 101 (H) 12/30/2021   LDLDIRECT 110 (H) 07/15/2011   TRIG 300 (H) 12/30/2021   CHOLHDL 4.7 (H) 12/30/2021   The 10-year ASCVD risk score (Arnett DK, et al., 2019) is: 5.2%    Values used to calculate the score:     Age: 75 years     Sex: Female     Is Non-Hispanic African American: No     Diabetic: No     Tobacco smoker: Yes     Systolic Blood Pressure: 138 mmHg     Is BP treated: No     HDL Cholesterol: 41 mg/dL     Total Cholesterol: 193 mg/dL  PERTINENT  PMH / PSH: COPD, fatty liver, tobacco use, HLD, BMI 28, bipolar disorder  OBJECTIVE:   BP 138/89   Pulse 96   Ht 5\' 5"  (1.651 m)   Wt 171 lb 9.6 oz (77.8 kg)   SpO2 98%   BMI 28.56 kg/m    General: Awake, alert, no acute distress Cardiac: Regular rate and rhythm, no murmurs appreciated, no carotid bruits on auscultation bilaterally, brisk cap refill, skin warm to touch, normal skin turgor Respiratory: Diffuse expiratory wheezing in all posterior fields, patient without respiratory distress or shortness of breath at this time  ASSESSMENT/PLAN:   Numbness and tingling in left arm New onset, worsening in frequency.  Quite concerning for cardiac etiology.  EKG in clinic today without ST elevation.  Differential is broad but most importantly still includes NSTEMI and CAD.  A1c collected today for risk stratification, found to be normal.  Strong family history of CAD, her mother had 2 stents by this age.  Urgent referral to cardiology.  Strict ED precautions given; if she develops  the symptoms again, she is to go to the ED immediately.  She relates she is quite determined to go out of town with her husband this weekend (as he just finished radiation treatments).  I discussed that this may not be the best idea and if she were to still go out of town, I want her to know exactly where the hospital is at her destination.  Patient voices understanding and is amenable to this plan.    Ezequiel Essex, MD [place]

## 2022-01-28 ENCOUNTER — Ambulatory Visit: Payer: Medicare Other | Attending: Internal Medicine | Admitting: Internal Medicine

## 2022-01-28 ENCOUNTER — Encounter: Payer: Self-pay | Admitting: Internal Medicine

## 2022-01-28 VITALS — BP 132/88 | HR 87 | Ht 64.0 in | Wt 169.0 lb

## 2022-01-28 DIAGNOSIS — R072 Precordial pain: Secondary | ICD-10-CM

## 2022-01-28 DIAGNOSIS — Z8249 Family history of ischemic heart disease and other diseases of the circulatory system: Secondary | ICD-10-CM | POA: Diagnosis not present

## 2022-01-28 LAB — BASIC METABOLIC PANEL
BUN/Creatinine Ratio: 11 (ref 9–23)
BUN: 9 mg/dL (ref 6–24)
CO2: 25 mmol/L (ref 20–29)
Calcium: 9.6 mg/dL (ref 8.7–10.2)
Chloride: 98 mmol/L (ref 96–106)
Creatinine, Ser: 0.79 mg/dL (ref 0.57–1.00)
Glucose: 90 mg/dL (ref 70–99)
Potassium: 5 mmol/L (ref 3.5–5.2)
Sodium: 137 mmol/L (ref 134–144)
eGFR: 92 mL/min/{1.73_m2} (ref >59)

## 2022-01-28 MED ORDER — METOPROLOL TARTRATE 100 MG PO TABS: 100.0000 mg | ORAL_TABLET | Freq: Once | ORAL | 0 refills | Status: DC

## 2022-01-28 NOTE — Progress Notes (Signed)
Cardiology Office Note:    Date:  01/28/2022   ID:  Diane Pacheco, DOB Sep 11, 1970, MRN [id number]  PCP:  Littie Deeds, MD   [facility] Providers Cardiologist:  Maisie Fus, MD     Referring MD: Littie Deeds, MD   Chief Complaint  Patient presents with   New Patient (Initial Visit)  Possible cardiac symptoms  History of Present Illness:    Diane Pacheco is a 51 y.o. female with a hx of COPD, asthma, depression, prior normal 12 lead ECGs, referral from her PCP for L arm and jaw tingling. It hurts as well. She notes it is intermittent. One episode was while she was shopping.  No significant SOB.  No significant LE edema. No PND or orthopnea. She is concerned it is cardiac. She is a 35 pack year smoker, she smoked cracked 10-12 years ago. She struggles with anxiety medication from the past,notes can't quit smoking 2/2 this. ASCVD 5.2%. Mother had an MI at 67.   Past Medical History:  Diagnosis Date   Asthma    exacerbations w/ respiratory infxns. Well controlled w/ abuterol PRN   Condyloma acuminatum    COPD (chronic obstructive pulmonary disease) (HCC)    Major depression    Unable to take antidepressents due to intolerance. Prefers to handle w/o Rx    Past Surgical History:  Procedure Laterality Date   HEMORRHOID SURGERY  2020   unsure of exact year   TUBAL LIGATION      Current Medications: Current Meds  Medication Sig   albuterol (VENTOLIN HFA) 108 (90 Base) MCG/ACT inhaler Inhale 1-2 puffs into the lungs every 6 (six) hours as needed for wheezing or shortness of breath. INHALE ONE TO TWO PUFFS BY MOUTH EVERY 6 HOURS AS NEEDED FOR WHEEZING AND FOR SHORTNESS OF BREATH   metoprolol tartrate (LOPRESSOR) 100 MG tablet Take 1 tablet (100 mg total) by mouth once for 1 dose. PLEASE TAKE METOPROLOL 2  HOURS PRIOR TO CTA SCAN.   Spacer/Aero-Holding Rudean Curt Use with inhaler as instructed     Allergies:   Patient has no known allergies.   Social  History   Socioeconomic History   Marital status: Significant Other    Spouse name: Not on file   Number of children: 3   Years of education: 84   Highest education level: 11th grade  Occupational History   Occupation: disabled  Tobacco Use   Smoking status: Every Day    Packs/day: 1.00    Types: Cigarettes   Smokeless tobacco: Never   Tobacco comments:    Started smoking at age 62  Vaping Use   Vaping Use: Never used  Substance and Sexual Activity   Alcohol use: Yes    Alcohol/week: 4.0 standard drinks of alcohol    Types: 4 Cans of beer per week    Comment: weekly weekends per pt, hard lemonade   Drug use: Yes    Types: Marijuana    Comment: pt states daily use   Sexual activity: Not Currently    Birth control/protection: Surgical  Other Topics Concern   Not on file  Social History Narrative   Lives in house with significant other. One level house, no trouble with stairs, has smoke alarms. No throw rugs on floor.   Has a cat, dog, bird, and fish.      Eat meat, vegetables, does not eat fruit. Likes to drink milk.   No real hobbies but likes to play pool. Also  enjoys cooking.      Always wears seat belt.      Social Determinants of Health   Financial Resource Strain: Low Risk  (04/03/2018)   Overall Financial Resource Strain (CARDIA)    Difficulty of Paying Living Expenses: Not hard at all  Food Insecurity: No Food Insecurity (04/03/2018)   Hunger Vital Sign    Worried About Running Out of Food in the Last Year: Never true    Ran Out of Food in the Last Year: Never true  Transportation Needs: No Transportation Needs (04/03/2018)   PRAPARE - Administrator, Civil Service (Medical): No    Lack of Transportation (Non-Medical): No  Physical Activity: Inactive (04/03/2018)   Exercise Vital Sign    Days of Exercise per Week: 0 days    Minutes of Exercise per Session: 0 min  Stress: No Stress Concern Present (04/03/2018)   [employer] of  Occupational Health - Occupational Stress Questionnaire    Feeling of Stress : Not at all  Social Connections: Somewhat Isolated (04/03/2018)   Social Connection and Isolation Panel [NHANES]    Frequency of Communication with Friends and Family: More than three times a week    Frequency of Social Gatherings with Friends and Family: Once a week    Attends Religious Services: Never    Database administrator or Organizations: No    Attends Engineer, structural: Never    Marital Status: Living with partner     Family History: The patient's family history includes Breast cancer in her maternal grandmother and paternal grandmother; COPD in her mother; Cancer in her mother; Dementia in her father; Heart attack in her mother.  ROS:   Please see the history of present illness.     All other systems reviewed and are negative.  EKGs/Labs/Other Studies Reviewed:    The following studies were reviewed today:   EKG:  EKG is  ordered today.  The ekg ordered today demonstrates   01/28/2022- NSR   Recent Labs: 12/30/2021: BUN 8; Creatinine, Ser 0.83; Potassium 5.3; Sodium 140   Recent Lipid Panel    Component Value Date/Time   CHOL 193 12/30/2021 1334   TRIG 300 (H) 12/30/2021 1334   HDL 41 12/30/2021 1334   CHOLHDL 4.7 (H) 12/30/2021 1334   CHOLHDL 6.6 (H) 05/10/2016 1014   VLDL 63 (H) 05/10/2016 1014   LDLCALC 101 (H) 12/30/2021 1334   LDLDIRECT 110 (H) 07/15/2011 1458     Risk Assessment/Calculations:         Physical Exam:    VS:   Vitals:   01/28/22 0832  BP: 132/88  Pulse: 87  SpO2: 98%     Wt Readings from Last 3 Encounters:  01/28/22 169 lb (76.7 kg)  01/26/22 171 lb 9.6 oz (77.8 kg)  12/30/21 170 lb 3.2 oz (77.2 kg)     GEN:  Well nourished, well developed in no acute distress HEENT: Normal NECK: No JVD; No carotid bruits LYMPHATICS: No lymphadenopathy CARDIAC: RRR, no murmurs, rubs, gallops RESPIRATORY:  Clear to auscultation without rales,  wheezing or rhonchi  ABDOMEN: Soft, non-tender, non-distended MUSCULOSKELETAL:  No edema; No deformity  SKIN: Warm and dry NEUROLOGIC:  Alert and oriented x 3 PSYCHIATRIC:  Normal affect   ASSESSMENT:    Possible cardiac symptoms:  ASCVD is borderline. She has significant risk factors including family hx. Will plan for coronary CT to risk stratify   PLAN:    In order of problems listed  above:  Coronary CTA morph Metop tartrate 100 mg x1 Follow up pending results      Medication Adjustments/Labs and Tests Ordered: Current medicines are reviewed at length with the patient today.  Concerns regarding medicines are outlined above.  Orders Placed This Encounter  Procedures   CT CORONARY MORPH W/CTA COR W/SCORE W/CA W/CM &/OR WO/CM   Basic metabolic panel   EKG 30-ZSWF   Meds ordered this encounter  Medications   metoprolol tartrate (LOPRESSOR) 100 MG tablet    Sig: Take 1 tablet (100 mg total) by mouth once for 1 dose. PLEASE TAKE METOPROLOL 2  HOURS PRIOR TO CTA SCAN.    Dispense:  1 tablet    Refill:  0    Patient Instructions  Medication Instructions:  PLEASE TAKE METOPROLOL TARTRATE 100mg  TWO HOURS PRIOR TO CCTA SCAN  *If you need a refill on your cardiac medications before your next appointment, please call your pharmacy*  Lab Work: BLOOD WORK TODAY  If you have labs (blood work) drawn today and your tests are completely normal, you will receive your results only by: [place] (if you have MyChart) OR A paper copy in the mail If you have any lab test that is abnormal or we need to change your treatment, we will call you to review the results.  Testing/Procedures: Your physician has requested that you have cardiac CT. Cardiac computed tomography (CT) is a painless test that uses an x-ray machine to take clear, detailed pictures of your heart. For further information please visit HugeFiesta.tn. Please follow instruction sheet as given.   Follow-Up: At  [facility], you and your health needs are our priority.  As part of our continuing mission to provide you with exceptional heart care, we have created designated Provider Care Teams.  These Care Teams include your primary Cardiologist (physician) and Advanced Practice Providers (APPs -  Physician Assistants and Nurse Practitioners) who all work together to provide you with the care you need, when you need it.  Your next appointment:   AS NEEDED   The format for your next appointment:   In Person  Provider:   Janina Mayo, MD     Other Instructions   Your cardiac CT will be scheduled at one of the below locations:   [facility] [address] [place], [place] [address] [phone]  If scheduled at [facility], please arrive at the [facility] and Children's Entrance (Entrance C2) of [facility] 30 minutes prior to test start time. You can use the FREE valet parking offered at entrance C (encouraged to control the heart rate for the test)  Proceed to the [facility] Radiology Department (first floor) to check-in and test prep.  All radiology patients and guests should use entrance C2 at [facility], accessed from [facility], even though the hospital's physical address listed is [address].     Please follow these instructions carefully (unless otherwise directed):  Hold all erectile dysfunction medications at least 3 days (72 hrs) prior to test. (Ie viagra, cialis, sildenafil, tadalafil, etc) We will administer nitroglycerin during this exam.   On the Night Before the Test: Be sure to Drink plenty of water. Do not consume any caffeinated/decaffeinated beverages or chocolate 12 hours prior to your test. Do not take any antihistamines 12 hours prior to your test.  On the Day of the Test: Drink plenty of water until 1 hour prior to the test. Do  not eat any food 1 hour prior to test. You may take your regular  medications prior to the test.  Take metoprolol (Lopressor) two hours prior to test. HOLD Furosemide/Hydrochlorothiazide morning of the test. FEMALES- please wear underwire-free bra if available, avoid dresses & tight clothing      After the Test: Drink plenty of water. After receiving IV contrast, you may experience a mild flushed feeling. This is normal. On occasion, you may experience a mild rash up to 24 hours after the test. This is not dangerous. If this occurs, you can take Benadryl 25 mg and increase your fluid intake. If you experience trouble breathing, this can be serious. If it is severe call 911 IMMEDIATELY. If it is mild, please call our office. If you take any of these medications: Glipizide/Metformin, Avandament, Glucavance, please do not take 48 hours after completing test unless otherwise instructed.  We will call to schedule your test 2-4 weeks out understanding that some insurance companies will need an authorization prior to the service being performed.   For non-scheduling related questions, please contact the cardiac imaging nurse navigator should you have any questions/concerns: Rockwell [place], Cardiac Imaging Nurse Navigator Larey Brick, Cardiac Imaging Nurse Navigator [facility] Direct Office Dial: [phone]   For scheduling needs, including cancellations and rescheduling, please call [place], [phone].           Signed, Maisie Fus, MD  01/28/2022 9:00 AM    [facility]

## 2022-01-28 NOTE — Patient Instructions (Signed)
Medication Instructions:  PLEASE TAKE METOPROLOL TARTRATE 100mg  TWO HOURS PRIOR TO CCTA SCAN  *If you need a refill on your cardiac medications before your next appointment, please call your pharmacy*  Lab Work: BLOOD WORK TODAY  If you have labs (blood work) drawn today and your tests are completely normal, you will receive your results only by: [place] (if you have MyChart) OR A paper copy in the mail If you have any lab test that is abnormal or we need to change your treatment, we will call you to review the results.  Testing/Procedures: Your physician has requested that you have cardiac CT. Cardiac computed tomography (CT) is a painless test that uses an x-ray machine to take clear, detailed pictures of your heart. For further information please visit HugeFiesta.tn. Please follow instruction sheet as given.   Follow-Up: At [facility], you and your health needs are our priority.  As part of our continuing mission to provide you with exceptional heart care, we have created designated Provider Care Teams.  These Care Teams include your primary Cardiologist (physician) and Advanced Practice Providers (APPs -  Physician Assistants and Nurse Practitioners) who all work together to provide you with the care you need, when you need it.  Your next appointment:   AS NEEDED   The format for your next appointment:   In Person  Provider:   Janina Mayo, MD     Other Instructions   Your cardiac CT will be scheduled at one of the below locations:   [facility] [address] [place], [place] [address] [phone]  If scheduled at [facility], please arrive at the [facility] and Children's Entrance (Entrance C2) of [facility] 30 minutes prior to test start time. You can use the FREE valet parking offered at entrance C (encouraged to control the heart rate for the test)  Proceed to the [facility] Radiology Department (first floor) to  check-in and test prep.  All radiology patients and guests should use entrance C2 at [facility], accessed from [facility], even though the hospital's physical address listed is [address]     Please follow these instructions carefully (unless otherwise directed):  Hold all erectile dysfunction medications at least 3 days (72 hrs) prior to test. (Ie viagra, cialis, sildenafil, tadalafil, etc) We will administer nitroglycerin during this exam.   On the Night Before the Test: Be sure to Drink plenty of water. Do not consume any caffeinated/decaffeinated beverages or chocolate 12 hours prior to your test. Do not take any antihistamines 12 hours prior to your test.  On the Day of the Test: Drink plenty of water until 1 hour prior to the test. Do not eat any food 1 hour prior to test. You may take your regular medications prior to the test.  Take metoprolol (Lopressor) two hours prior to test. HOLD Furosemide/Hydrochlorothiazide morning of the test. FEMALES- please wear underwire-free bra if available, avoid dresses & tight clothing      After the Test: Drink plenty of water. After receiving IV contrast, you may experience a mild flushed feeling. This is normal. On occasion, you may experience a mild rash up to 24 hours after the test. This is not dangerous. If this occurs, you can take Benadryl 25 mg and increase your fluid intake. If you experience trouble breathing, this can be serious. If it is severe call 911 IMMEDIATELY. If it is mild, please call our office. If  you take any of these medications: Glipizide/Metformin, Avandament, Glucavance, please do not take 48 hours after completing test unless otherwise instructed.  We will call to schedule your test 2-4 weeks out understanding that some insurance companies will need an authorization prior to the service being performed.   For non-scheduling related questions, please contact the cardiac imaging  nurse navigator should you have any questions/concerns: Marchia Bond, Cardiac Imaging Nurse Navigator Gordy Clement, Cardiac Imaging Nurse Navigator [facility] Direct Office Dial: [phone]   For scheduling needs, including cancellations and rescheduling, please call [place], [phone].

## 2022-02-04 ENCOUNTER — Ambulatory Visit (HOSPITAL_COMMUNITY): Payer: Medicare Other | Admitting: Licensed Clinical Social Worker

## 2022-02-09 ENCOUNTER — Telehealth (HOSPITAL_COMMUNITY): Payer: Self-pay | Admitting: Emergency Medicine

## 2022-02-09 NOTE — Telephone Encounter (Signed)
Reaching out to patient to offer assistance regarding upcoming cardiac imaging study; pt verbalizes understanding of appt date/time, parking situation and where to check in, pre-test NPO status and medications ordered, and verified current allergies; name and call back number provided for further questions should they arise Diane Bond RN Navigator Cardiac Imaging [facility] [phone] office [phone] cell  Arrival 230 , WC entrance Denies iv issues 100mg  metoprolol

## 2022-02-10 ENCOUNTER — Ambulatory Visit (HOSPITAL_COMMUNITY)
Admission: RE | Admit: 2022-02-10 | Discharge: 2022-02-10 | Disposition: A | Discharge status: Home / Self Care | Payer: Medicare Other | Source: Ambulatory Visit | Attending: Internal Medicine | Admitting: Internal Medicine

## 2022-02-10 DIAGNOSIS — R072 Precordial pain: Secondary | ICD-10-CM | POA: Insufficient documentation

## 2022-02-10 MED ORDER — IOHEXOL 350 MG/ML SOLN
100.0000 mL | Freq: Once | INTRAVENOUS | Status: AC | PRN
  Administered 2022-02-10: 100 mL via INTRAVENOUS

## 2022-02-10 MED ORDER — NITROGLYCERIN 0.4 MG SL SUBL
SUBLINGUAL_TABLET | SUBLINGUAL | Status: AC
  Filled 2022-02-10: qty 2

## 2022-02-10 MED ORDER — NITROGLYCERIN 0.4 MG SL SUBL
0.8000 mg | SUBLINGUAL_TABLET | Freq: Once | SUBLINGUAL | Status: AC
  Administered 2022-02-10: 0.8 mg via SUBLINGUAL

## 2022-02-18 ENCOUNTER — Ambulatory Visit (INDEPENDENT_AMBULATORY_CARE_PROVIDER_SITE_OTHER): Payer: Medicare Other | Admitting: Licensed Clinical Social Worker

## 2022-02-18 DIAGNOSIS — F411 Generalized anxiety disorder: Secondary | ICD-10-CM | POA: Diagnosis not present

## 2022-02-18 DIAGNOSIS — F331 Major depressive disorder, recurrent, moderate: Secondary | ICD-10-CM | POA: Diagnosis not present
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
## 2022-03-04 ENCOUNTER — Ambulatory Visit (INDEPENDENT_AMBULATORY_CARE_PROVIDER_SITE_OTHER): Payer: Medicare Other | Admitting: Licensed Clinical Social Worker

## 2022-03-04 DIAGNOSIS — F411 Generalized anxiety disorder: Secondary | ICD-10-CM | POA: Diagnosis not present

## 2022-03-04 DIAGNOSIS — F331 Major depressive disorder, recurrent, moderate: Secondary | ICD-10-CM

## 2022-03-04 NOTE — Progress Notes (Signed)
Virtual Visit via Video Note  I connected with Diane Pacheco on 03/04/22 at  8:00 AM EST by a video enabled telemedicine application and verified that I am speaking with the correct person using two identifiers.  Location: Patient: home Provider: home office   I discussed the limitations of evaluation and management by telemedicine and the availability of in person appointments. The patient expressed understanding and agreed to proceed.  I discussed the assessment and treatment plan with the patient. The patient was provided an opportunity to ask questions and all were answered. The patient agreed with the plan and demonstrated an understanding of the instructions.   The patient was advised to call back or seek an in-person evaluation if the symptoms worsen or if the condition fails to improve as anticipated.  I provided 45 minutes of non-face-to-face time during this encounter.  THERAPIST PROGRESS NOTE  Session Time: 8:00 AM to 8:45 AM  Participation Level: Active  Behavioral Response: CasualAlertappropriate  Type of Therapy: Individual Therapy  Treatment Goals addressed: stress management, supportive interventions to help patient be caretaker for family members, emotional regulation for anxiety, anger, depression, strategies to help her in caretaking that will help with stress  ProgressTowards Goals: Progressing-helpful for patient to process thoughts and feelings to help with her stressors as well as being provided with supportive interventions as caretaker  Interventions: Solution Focused, Strength-based, Supportive, and Other: coping  Summary: Diane Pacheco is a 51 y.o. female who presents with have to get a letter to get out of jury duty. Have to do that today can't do jury duty all those people looking at her would break down and cry. It is like being in front of the principle. Patient talking out Dad with siblings just them four last of the family unit. Lesly Rubenstein is the  baby he resented Mom mom left when patient was in 7th grade.  When Mom go them only got her Sula Soda was older and out of the house. They get along fine he is Secondary school teacher. Met [place] her dog. Went to see Tiburcio Pea in hospice liver failing. Aunt Dub Mikes thinking of going to [place] who has cancer. Two people on both side of the family getting ready to pass. [name] getting all her teeth pulled out today. She had so many fall out. This week throat exercise- physical therapy for throat. Trachea can get hard and not work anymore. Happens 3 6 months after radiation. Exercises chances go down like 85% he has to do for next 6 months so he doesn't have to have feeding tube.Rodman Pickle is doing ok best he can. Made through radiation more than most people used to pain on daily basis throat pain didn't touch back pain. He has it all the time for past 15-20 years. Have three scooter chairs. After he had surgery and could breath things better. [place] the toy section Patient's birthday is coming doesn't know what going to do. Cooked good Beef Stroganoff. Enjoys cooking. Watching [place] and [place] movies.      This patient herself identifies assess helpful to have another outlet to be able to process thoughts and feelings process also given space and support as she talks about different issues in her life managing stressors.Bear dealing with significant medical issues patient is the caretaker so once again working on treatment goal of providing support to her as a caregiver.  Noted some positive plans along with the stressors such as taking her dad's birthday.  Was on some of the things she enjoys assess helpful as part of treatment interventions to think about positive things to help with mood.  Therapist provided active listening open questions supportive interventions. Suicidal/Homicidal: No  Plan: Return again in 2 weeks.  Diagnosis: Major Depressive Disorder, recurrent, moderate (history of  bipolar) Generalized Anxiety Disorder  Collaboration of Care: Other none needed  Patient/Guardian was advised Release of Information must be obtained prior to any record release in order to collaborate their care with an outside provider. Patient/Guardian was advised if they have not already done so to contact the registration department to sign all necessary forms in order for [place] to release information regarding their care.   Consent: Patient/Guardian gives verbal consent for treatment and assignment of benefits for services provided during this visit. Patient/Guardian expressed understanding and agreed to proceed.   Cordella Register, LCSW 03/04/2022

## 2022-03-08 ENCOUNTER — Telehealth: Payer: Self-pay | Admitting: Family Medicine

## 2022-03-08 NOTE — Telephone Encounter (Signed)
Unfortunately, that does not qualify as a condition to excuse her from jury duty.

## 2022-03-08 NOTE — Telephone Encounter (Signed)
Spoke with pt she stated that she have panic attacks really bad when she gets around crowds of people. She stated that if she needs to come in for a visit she will. She also asked if you could give her a call explaining why she can't be excused. Phone number to reach her is [phone]

## 2022-03-08 NOTE — Telephone Encounter (Signed)
Please advise that she schedule an appointment to discuss further if she would like to pursue jury duty excuse.  I personally have not addressed her anxiety in depth so I do not feel comfortable writing her out without further information.

## 2022-03-08 NOTE — Telephone Encounter (Signed)
Patient called stating she has jury duty on 04/01/2022. She stated she is needing a letter excusing her from it, due to her MDD and Bipolar with anxiety.   Please advise   Thanks!

## 2022-03-09 NOTE — Telephone Encounter (Signed)
Attempted to reach patient. No answer. Unable to LVM due to one not being set up. Aquilla Solian, CMA

## 2022-03-17 ENCOUNTER — Ambulatory Visit (INDEPENDENT_AMBULATORY_CARE_PROVIDER_SITE_OTHER): Payer: Medicare Other | Admitting: Licensed Clinical Social Worker

## 2022-03-17 DIAGNOSIS — F331 Major depressive disorder, recurrent, moderate: Secondary | ICD-10-CM | POA: Diagnosis not present

## 2022-03-17 DIAGNOSIS — F411 Generalized anxiety disorder: Secondary | ICD-10-CM

## 2022-03-17 NOTE — Progress Notes (Signed)
Virtual Visit via Video Note  I connected with Diane Pacheco on 03/17/22 at 10:00 AM EST by a video enabled telemedicine application and verified that I am speaking with the correct person using two identifiers.  Location: Patient: home Provider: home office   I discussed the limitations of evaluation and management by telemedicine and the availability of in person appointments. The patient expressed understanding and agreed to proceed.   I discussed the assessment and treatment plan with the patient. The patient was provided an opportunity to ask questions and all were answered. The patient agreed with the plan and demonstrated an understanding of the instructions.   The patient was advised to call back or seek an in-person evaluation if the symptoms worsen or if the condition fails to improve as anticipated.  I provided 45 minutes of non-face-to-face time during this encounter.  THERAPIST PROGRESS NOTE  Session Time: 9:00 AM to 9:45 AM  Participation Level: Active  Behavioral Response: CasualAlertAnxious  Type of Therapy: Individual Therapy  Treatment Goals addressed: tress management, supportive interventions to help patient be caretaker for family members, emotional regulation for anxiety, anger, depression, strategies to help her in caretaking that will help with stress  ProgressTowards Goals: Progressing-therapy helpful for patient in processing thoughts and feelings to help with stressors of being a caretaker and other stressors  Interventions: Solution Focused, Strength-based, Supportive, and Other: coping  Summary: Diane Pacheco is a 51 y.o. female who presents with shares that can wake up often early especially if set alarm will wake up a couple hours early. Doctor says what she has doesn't count for getting out of jury duty.  Therapist expressed surprise.  Diane Pacheco's operation two days before jury duty he will be getting out of the hospital. Diane Pacheco duty is on 14th  operation on 12th. Going this Tuesday for pre-op for his back. Therapist guided patient in coping approaching it like one thing at a time.  First thing to get through the stress of the surgery then can address jury duty.  Patient agreed with uses this as a coping strategy.  Patient says If talk to her if goes to jury duty she will start crying on the spot. Therapist said that will get her out of jury duty and patient laughed. Talked about her Thanksgiving and decided to invite Diane Pacheco, [place],  Diane Pacheco and his wife, Diane Pacheco. She was working non-stop. It was all the cleaning while the [place] was cooking. Diane Pacheco's girl did a load of dishes when came in Diane Pacheco helped with the stuffing wasn't too bad. Not too many times when people help her. Didn't get to hold the baby. Diane Pacheco wanted everybody why the decided to do it. His buddy he likes to play with is Diane Pacheco who is 49 years old who is his love.  Entertaining Diane Pacheco's family. Her birthday is this weekend no plans. What's on his mind Diane Pacheco's surgery. Happy that cat is in cat house. Cat wouldn't go into cat house finally got her in. Thinks Diane Pacheco patient said doing it for years let the young folk do it..Asked about her daily life patient keeps busy always cleaning going to the store. Diane Pacheco feeding the squirrels and birds. Diane Pacheco's mom with sister in [place] spent time with her will probably be last Thanksgiving.together. His Mom was on the phone crying related to being sad about the show patient said probably not a show for her to watch. Transitioned to talking about things she enjoys such as different television  series she is watching.  Therapist provided space and support for patient to talk about thoughts and feelings in session.  Helpful in terms of processing her stressors which includes upcoming surgery for Diane Pacheco.  Focused on positive patient put together Thanksgiving meal for family rewarding for them and for her nice to be able to spend time together  with family.  Also patient deserves credit for doing a lot of the work for the meal.  Focused on different subjects of interest such as watching shows she looks forward to.  Different subjects also help positive focus help with mood and distraction patient says goal of treatment is to help with caretaking duties so helps to have a place such as therapy to get away from regular responsibilities.  Therapist provided active listening open questions supportive interventions.      Suicidal/Homicidal: No  Plan: Return again in 2 weeks.2.Therapist work with patient on stress management, grief, coping   Diagnosis: Major Depressive Disorder, recurrent, moderate (history of bipolar) Generalized Anxiety Disorder  Collaboration of Care: Other none needed  Patient/Guardian was advised Release of Information must be obtained prior to any record release in order to collaborate their care with an outside provider. Patient/Guardian was advised if they have not already done so to contact the registration department to sign all necessary forms in order for [place] to release information regarding their care.   Consent: Patient/Guardian gives verbal consent for treatment and assignment of benefits for services provided during this visit. Patient/Guardian expressed understanding and agreed to proceed.   Diane Breeze, LCSW 03/17/2022

## 2022-04-01 ENCOUNTER — Encounter (HOSPITAL_COMMUNITY): Payer: Self-pay

## 2022-04-01 ENCOUNTER — Ambulatory Visit (HOSPITAL_COMMUNITY): Payer: Medicare Other | Admitting: Licensed Clinical Social Worker

## 2022-04-01 NOTE — Progress Notes (Signed)
Therapist contacted patient through My Chart and she did not respond. Session a no show

## 2022-04-15 ENCOUNTER — Ambulatory Visit (INDEPENDENT_AMBULATORY_CARE_PROVIDER_SITE_OTHER): Payer: Medicare Other | Admitting: Licensed Clinical Social Worker

## 2022-04-15 DIAGNOSIS — F331 Major depressive disorder, recurrent, moderate: Secondary | ICD-10-CM | POA: Diagnosis not present

## 2022-04-15 DIAGNOSIS — F411 Generalized anxiety disorder: Secondary | ICD-10-CM | POA: Diagnosis not present

## 2022-04-15 NOTE — Progress Notes (Signed)
Virtual Visit via Video Note  I connected with Diane Pacheco on 04/15/22 at  8:00 AM EST by a video enabled telemedicine application and verified that I am speaking with the correct person using two identifiers.  Location: Patient: home Provider: home office   I discussed the limitations of evaluation and management by telemedicine and the availability of in person appointments. The patient expressed understanding and agreed to proceed.   I discussed the assessment and treatment plan with the patient. The patient was provided an opportunity to ask questions and all were answered. The patient agreed with the plan and demonstrated an understanding of the instructions.   The patient was advised to call back or seek an in-person evaluation if the symptoms worsen or if the condition fails to improve as anticipated.  I provided 30 minutes of non-face-to-face time during this encounter.  THERAPIST PROGRESS NOTE  Session Time: 8:00 AM to 8:30 AM  Participation Level: Active  Behavioral Response: CasualAlertappropriate   Type of Therapy: Individual Therapy  Treatment Goals addressed:  stress management, supportive interventions to help patient be caretaker for family members, emotional regulation for anxiety, anger, depression, strategies to help her in caretaking that will help with stress  ProgressTowards Goals: Progressing-reviewed treatment plan patient finds therapy helpful for working on treatment goals helpful in this session processing thoughts and feelings to help her with coping  Interventions: Solution Focused, Strength-based, Supportive, and Other: coping  Summary: Diane Pacheco is a 51 y.o. female who presents with update for therapist about recent events she had the stress of possibly being called for jury duty but didn't have to go. Spent a week with Bear in the hospital didn't get much sleep. Thinks he is going to be able to walk in awhile that is a good thing.  Came home a week afterwards.  Therapist noted things could be settling down patient says now they have to go to PCP and a funeral. She says if is not one thing another. Didn't do anything for Christmas celebrating at [employer] this Saturday. Back to talking about [name] he can lift his leg to hip now before couldn't nerve pain eased up the thighs will always be a pain but a lot less after surgery. Once get back to [facility] and build muscles up with muscles and legs will be able to walk again. Does some walking when he is on exercise machines. Past few years have been tough. Pretty soon will be moving in with [name] not going to be great crowded but have to do what have to do. Get along with them which is good. Ok that the cancer is probably gone. His health seems to be getting better. Glad that the surgery "fixed him". Glad to get out of jury duty if went got up there and cried and have to send her home. Watching some good shows did a bunch of new Christmas movies this year. Going pretty well.   Therapist reviewed treatment plan patient gave consent to complete virtually and patient noted helpful to have therapy to help with coping help with treatment goals.  Therapist as she has had some stressors recently related things seem to be going okay with bare surgery, report of his being cancer free.  Plan now is for them to go to the gym and therapist noted this will be healthy for them, positive activity.  Ongoing issues present themselves including will probably be moving soon into mother-in-law's house.  Talked about holiday activities appears it  has been relatively quiet for them although going to her son's this weekend.  Assess helpful for patient have an outlet to talk about her feelings therapist provided space and support in this process of talking about thoughts and feelings.  Also perspective taking with feedback to patient. Suicidal/Homicidal: No  Plan: Return again in 2 weeks.2.Therapist work with patient on  stress management, grief, coping   Diagnosis: Major Depressive Disorder, recurrent, moderate (history of bipolar) Generalized Anxiety Disorder  Collaboration of Care: Other none needed  Patient/Guardian was advised Release of Information must be obtained prior to any record release in order to collaborate their care with an outside provider. Patient/Guardian was advised if they have not already done so to contact the registration department to sign all necessary forms in order for [place] to release information regarding their care.   Consent: Patient/Guardian gives verbal consent for treatment and assignment of benefits for services provided during this visit. Patient/Guardian expressed understanding and agreed to proceed.   Coolidge Breeze, LCSW 04/15/2022

## 2022-04-29 ENCOUNTER — Ambulatory Visit (INDEPENDENT_AMBULATORY_CARE_PROVIDER_SITE_OTHER): Payer: Medicare Other | Admitting: Licensed Clinical Social Worker

## 2022-04-29 DIAGNOSIS — F331 Major depressive disorder, recurrent, moderate: Secondary | ICD-10-CM

## 2022-04-29 DIAGNOSIS — F411 Generalized anxiety disorder: Secondary | ICD-10-CM | POA: Diagnosis not present

## 2022-04-29 NOTE — Progress Notes (Signed)
Virtual Visit via Video Note  I connected with Diane Pacheco on 04/29/22 at  9:00 AM EST by a video enabled telemedicine application and verified that I am speaking with the correct person using two identifiers.  Location: Patient: home Provider: home office   I discussed the limitations of evaluation and management by telemedicine and the availability of in person appointments. The patient expressed understanding and agreed to proceed.   I discussed the assessment and treatment plan with the patient. The patient was provided an opportunity to ask questions and all were answered. The patient agreed with the plan and demonstrated an understanding of the instructions.   The patient was advised to call back or seek an in-person evaluation if the symptoms worsen or if the condition fails to improve as anticipated.  I provided 45 minutes of non-face-to-face time during this encounter.  THERAPIST PROGRESS NOTE  Session Time: 9:00 AM to 9:45 AM  Participation Level: Active  Behavioral Response: CasualAlertappropriate  Type of Therapy: Individual Therapy  Treatment Goals addressed:  stress management, supportive interventions to help patient be caretaker for family members, emotional regulation for anxiety, anger, depression, strategies to help her in caretaking that will help with stress  ProgressTowards Goals: Progressing-patient continues to utilize therapy for support and processing of thoughts and feelings to help with being a caretaker as well as regulation of mental health symptoms  Interventions: Solution Focused, Strength-based, Supportive, and Other: coping  Summary: Diane Pacheco is a 52 y.o. female who presents with alright can't complain. Bear still not quite able to walk getting there. Not quite ready for [employer] still painful to walk around the house. He is doing it every hour getting up to walk with walker.  Therapist noted must be hard to see him in pain and patient  agreed with that.  Went to [employer] and it was nice. Dad and Lynder Parents and his wife, Dewitt Hoes and Gwendolyn Fill, his husband Sheryle Hail wife's mom and sister. Lynne  didn't show up. Michela Pitcher nobody told her. Patient says everybody told her don't put on them just excuse. Bear up at 2 AM leg has been jumping maybe nerves in back. Normally hits him at 4 AM bad last night. Does her games in the morning and coffee has an hour in the morning. Have to have or cranky. Morning routine and enjoys it. Relax before have to work. Dates for April for vacation something to look forward to. Goes in April and September[place]. Mom coming and that will be nice. Bear's Aunt Noreene Larsson passed went to funeral. Mom sister has cancer on hospice in [place]. Spread everywhere. Transitioned to talking about what movies watching have been watching Juliette Mangle.  That led to conversation about different movies we have enjoyed as well as recommendations with homework assignment to have a movie recommendation as well as cooking addition to recommend.  Talked about cooking different recipes. Going to [place] will let therapist know which does she would recommend..    In this session highly did some of the positive things patient does noted some of the activities and our interests are how we can enjoy life and it does not have to be the big things but pursuing interests watching movies are how we enhance and enjoy life.  Noted for patient cooking and being at a point where she can try many different type of dishes for example looking into [place] food.  Noted morning routine as necessary helps [place]  get ready for the day.  Reviewed stressors therapist providing space and support for patient to talk about stressors which right now main 58 Bear recuperating from surgery.  Noted other activities helpful for patient include going to the grocery store and therapist noted a big intervention with therapy is how we interpret things noted following  patient's lead and framing a trip to grocery stores a fun experience.  Assess helpful for patient have an outlet to help with mood enhancement as well as managing stressors.  Suicidal/Homicidal: No  Plan: Return again in 2 weeks.2.herapist work with patient on stress management, grief, coping    Diagnosis: Major Depressive Disorder, recurrent, moderate (history of bipolar) Generalized Anxiety Disorder  Collaboration of Care: Other none needed  Collaboration of Care: Other none needed  Patient/Guardian was advised Release of Information must be obtained prior to any record release in order to collaborate their care with an outside provider. Patient/Guardian was advised if they have not already done so to contact the registration department to sign all necessary forms in order for [place] to release information regarding their care.   Consent: Patient/Guardian gives verbal consent for treatment and assignment of benefits for services provided during this visit. Patient/Guardian expressed understanding and agreed to proceed.   Cordella Register, LCSW 04/29/2022

## 2022-05-13 ENCOUNTER — Ambulatory Visit (INDEPENDENT_AMBULATORY_CARE_PROVIDER_SITE_OTHER): Payer: Medicare Other | Admitting: Licensed Clinical Social Worker

## 2022-05-13 DIAGNOSIS — F331 Major depressive disorder, recurrent, moderate: Secondary | ICD-10-CM

## 2022-05-13 DIAGNOSIS — F411 Generalized anxiety disorder: Secondary | ICD-10-CM

## 2022-05-13 NOTE — Progress Notes (Signed)
Virtual Visit via Video Note  I connected with Diane Pacheco on 05/13/22 at  9:00 AM EST by a video enabled telemedicine application and verified that I am speaking with the correct person using two identifiers.  Location: Patient: home Provider: home office   I discussed the limitations of evaluation and management by telemedicine and the availability of in person appointments. The patient expressed understanding and agreed to proceed.   I discussed the assessment and treatment plan with the patient. The patient was provided an opportunity to ask questions and all were answered. The patient agreed with the plan and demonstrated an understanding of the instructions.   The patient was advised to call back or seek an in-person evaluation if the symptoms worsen or if the condition fails to improve as anticipated.  I provided 50 minutes of non-face-to-face time during this encounter.  THERAPIST PROGRESS NOTE  Session Time: 9:00 AM to 9:50 AM  Participation Level: Active  Behavioral Response: CasualAlertEuthymic  Type of Therapy: Individual Therapy  Treatment Goals addressed:  stress management, supportive interventions to help patient be caretaker for family members, emotional regulation for anxiety, anger, depression, strategies to help her in caretaking that will help with stress  ProgressTowards Goals: Progressing-therapist sessions help patient with processing thoughts and feelings to cope with stressors therapist encouraging effective coping strategies patient is using identifying that are helpful for her  Interventions: Solution Focused, Strength-based, Supportive, and Other: coping  Summary: Diane Pacheco is a 52 y.o. female who presents with weather has been something going from hot to cold, got rid of the stray cat causing havoc. Therapist explored with patient eating cat food, kicked the cat, Spot, out of cat house, causing the dog to bark. Went ahead caught in trap  took it to Advertising account executive.  Talked about her pets fish-was interesting in she educated therapist about her particular fish dinosaur bichir, a more primitive fish, rope fish, dog, snake-Killer, cat and mice-Jingles. Peeko birthday on Super bowl little chihuahua celebrate both Super Bowl and birthday. Talked about appetizers wants to make. Did butter chicken and Naan bread. Made a couple [place] dishes. Made roasted cauliflower. Made her Naan bread.  Bear still has throat issues really sore, coughing spitting up junk, things salvia from lungs things not working well. Feels more symptoms than when had chemotherapy. Best date night ever. Listening to music he was trying to dance had a good time. He is working on walking. He tries to use walker to go to bathroom when he can. Some days better than others.  Go to [place] soon Angelita Ing[place] mom sister not doing well. Fighting cancer for 8-9 years. Will be there for support for Mom last one left in the family. Therapist noted can make you pause. When people around you start to pass. Talked her parents mom passed, Dad has dementia now retired.  Patient talked [place] roots, [place]. Favorite group is [place] recommended the song "Tatum" tells what is wrong on [place]. Introduced [place] going to be Marketing executive. Common links between different religion. Always in church hanging out with pastor and his wife. Raised by Dad religious he is still going to church.        As we reviewed different activities assess different coping strategies patient has.  Likes to cook one of her main coping skills.  Reviewed as well as stressors processing thoughts and feelings as well as noting effective coping for patient as she  was able to get rid of the stray in the house..  Reviewed other things going on in her life supporting Bear as he recovers from medical procedures noting good and bad days noting a really positive date night that she  could celebrate is also helpful for coping.Spent some time talking about patient's background, more about ancestry, her father, her son.  Helpful to know these aspects of different family relationships as we work on issues and treatment.  Therapist provided active listening open questions supportive interventions. Suicidal/Homicidal: No  Plan: Return again in 2 weeks.2.Therapist work with patient on stress management, grief, coping   Diagnosis:  Major Depressive Disorder, recurrent, moderate (history of bipolar) Generalized Anxiety Disorder  Collaboration of Care: Other none needed  Patient/Guardian was advised Release of Information must be obtained prior to any record release in order to collaborate their care with an outside provider. Patient/Guardian was advised if they have not already done so to contact the registration department to sign all necessary forms in order for [place] to release information regarding their care.   Consent: Patient/Guardian gives verbal consent for treatment and assignment of benefits for services provided during this visit. Patient/Guardian expressed understanding and agreed to proceed.   Cordella Register, LCSW 05/13/2022

## 2022-05-27 ENCOUNTER — Ambulatory Visit (INDEPENDENT_AMBULATORY_CARE_PROVIDER_SITE_OTHER): Payer: Medicare Other | Admitting: Licensed Clinical Social Worker

## 2022-05-27 DIAGNOSIS — F411 Generalized anxiety disorder: Secondary | ICD-10-CM | POA: Diagnosis not present

## 2022-05-27 DIAGNOSIS — F331 Major depressive disorder, recurrent, moderate: Secondary | ICD-10-CM

## 2022-05-27 NOTE — Progress Notes (Signed)
Virtual Visit via Video Note  I connected with Theola Sequin on 05/27/22 at 10:00 AM EST by a video enabled telemedicine application and verified that I am speaking with the correct person using two identifiers.  Location: Patient: home  Provider: home office   I discussed the limitations of evaluation and management by telemedicine and the availability of in person appointments. The patient expressed understanding and agreed to proceed.   I discussed the assessment and treatment plan with the patient. The patient was provided an opportunity to ask questions and all were answered. The patient agreed with the plan and demonstrated an understanding of the instructions.   The patient was advised to call back or seek an in-person evaluation if the symptoms worsen or if the condition fails to improve as anticipated.  I provided 51 minutes of non-face-to-face time during this encounter.   THERAPIST PROGRESS NOTE  Session Time: 10:00 AM to 10:51 AM  Participation Level: Active  Behavioral Response: CasualAlertappropriate  Type of Therapy: Individual Therapy  Treatment Goals addressed: stress management, supportive interventions to help patient be caretaker for family members, emotional regulation for anxiety, anger, depression, strategies to help her in caretaking that will help with stress  ProgressTowards Goals: Progressing-various topics as well as processing thoughts and feelings helps with stress, emotional regulation  Interventions: Solution Focused, Strength-based, Supportive, and Other: Coping  Summary: GRISELLE RUFER is a 52 y.o. female who presents with they are doing alright getting ready for Super Bowl. Making appetizers. Going to have [place] middle child just found out about. He is helping [place] a lot. [place]'s birthday found him on Super Bowl Sunday believe in 2014. They are on a kick watching movies about Jesus. "Jesus Revolution" had a song  recommendation for therapist "Drunken Scotsman" Babysitting Braylen had to get her up for school. Had to get up at 5:50 AM bus and how by 4:40 PM. It is ridiculous. Tournament of champions on [employer] love it. Right now floating around homemade chicken stir fly homemade egg roles. Left over egg roll wrappers thinking about [place] steak and cheese in egg roll wrapper thinking about how to do it. Therapist points out feels good to create. Patient says she  enjoys watching people eat what she cooks. Another incident of helping somebody she shared helped an older guy in chart can drive lift groceries in car. Patient says she thinks they realize somebody still good part of the positive experience.       Therapist noted for patient some of her activities are good for coping.  And with these activities surface other good qualities of hers.  Noted cooking is creative and how helpful it is for people to be engaged in creative process for mood for feeling good about oneself.  Also noted patient gravitates toward helping people and this being a great quality, she is a caretaker and this flows to other things where she feels good when she does things for other people.  Therapist noted it actually is good for that person also good for [place] feels good to be part of a good experience. Topic covered different topics different interests assess enhance his mood in session so good therapeutic intervention.  Discussion included good movie seen, looking forward to Super Bowl therapist noted that is very positive its chance to plan something that you can enjoy, think she is going to be cooking for Super Bowl, different ideas about another meal she is going to prepare, review of her  [place] heritage how therapist learned this about her discussing and patient recommending going to a [place] event would be enjoyable.  Therapist provided space and support for patient to talk about thoughts and feelings in  session.  Suicidal/Homicidal: No  Plan: Return again in 2 weeks.2.Marland KitchenTherapist work with patient on stress management, grief, coping   Diagnosis: Major Depressive Disorder, recurrent, moderate (history of bipolar) Generalized Anxiety Disorder   Collaboration of Care: Other none needed  Patient/Guardian was advised Release of Information must be obtained prior to any record release in order to collaborate their care with an outside provider. Patient/Guardian was advised if they have not already done so to contact the registration department to sign all necessary forms in order for [place] to release information regarding their care.   Consent: Patient/Guardian gives verbal consent for treatment and assignment of benefits for services provided during this visit. Patient/Guardian expressed understanding and agreed to proceed.   Cordella Register, LCSW 05/27/2022

## 2022-06-09 ENCOUNTER — Ambulatory Visit (INDEPENDENT_AMBULATORY_CARE_PROVIDER_SITE_OTHER): Payer: Medicare Other | Admitting: Licensed Clinical Social Worker

## 2022-06-09 DIAGNOSIS — F331 Major depressive disorder, recurrent, moderate: Secondary | ICD-10-CM | POA: Diagnosis not present

## 2022-06-09 DIAGNOSIS — F411 Generalized anxiety disorder: Secondary | ICD-10-CM

## 2022-06-09 NOTE — Progress Notes (Signed)
Virtual Visit via Video Note  I connected with Theola Sequin on 06/09/22 at  9:00 AM EST by a video enabled telemedicine application and verified that I am speaking with the correct person using two identifiers.  Location: Patient: home Provider: home office   I discussed the limitations of evaluation and management by telemedicine and the availability of in person appointments. The patient expressed understanding and agreed to proceed.  I discussed the assessment and treatment plan with the patient. The patient was provided an opportunity to ask questions and all were answered. The patient agreed with the plan and demonstrated an understanding of the instructions.   The patient was advised to call back or seek an in-person evaluation if the symptoms worsen or if the condition fails to improve as anticipated.  I provided 45 minutes of non-face-to-face time during this encounter.  THERAPIST PROGRESS NOTE  Session Time: 9:00 AM to 9:45 AM  Participation Level: Active  Behavioral Response: CasualAlertDepressed  Type of Therapy: Individual Therapy  Treatment Goals addressed: stress management, supportive interventions to help patient be caretaker for family members, emotional regulation for anxiety, anger, depression, strategies to help her in caretaking that will help with stress  ProgressTowards Goals: Progressing-utilize supportive interventions patient dealing with significant stress will soon lose her dog admits of having to go to her funeral, supportive interventions for patient as a caretaker, also utilizing grief interventions, coping  Interventions: Solution Focused, Strength-based, Supportive, and Other: Coping, grief  Summary: SYDNYE HANFT is a 52 y.o. female who presents with Bear's aunt passed have to go Thursday to the funeral got to see her before the funeral. Their little dog getting ready to pass he is between 13-16. Her and Bear's baby. Peco past few months  wouldn't eat by the end of the day eating didn't think much of it and now not eating and not pooping, not drinking water, bad heart murmur, teeth bad. There is stress and funeral in two days. A lot of crying going on house. There is always something. Did get to see Vonna Kotyk the youngest came from [place] that was nice. Between school and work doesn't have a lot of time.therapist validated patient on how things can come at once do not have control of them patient agreed saying we are along for the ride. Good thing happen though as well therapist said with bad. Patient said called Thedore Mins and tell him about Peco he is her rock. He listens they were on phone for an hour. He is there for her. Going to bury [place]eco in [place] pet cemetary over at [employer]. Patient therapist could access take on the main role of managing patient says have to carry him and carry outside to pee. Cat knows won't go near. This week watching every sad pet movie.  Watching movies and montage to them bring joy but limited with them. Personally put down mouse and fish. Does the hard work as the caretaker. Have to deal with these hard experiences. Peco sleeps in bed between them at night.  Patient says thinks kids need to have small pets to get ready for death should not hide it. Therapist agrees patient points out in other cultures family live together don't send older people way. Not be in so much denial about it.  Therapist noted would be helpful for loss to have it more integrated into culture part of her life patient agrees.  Therapist did note after serious illness the idea of death was awakening this is all  have so what do you want to do with time. Patient said make sure you tell people that you love them.  Will put Peco in box with caterpillar one that he loves. Rodman Pickle doing a lot of [employer]. Patient read a great book Three people you meet in heaven. Leaving tomorrow and a lot of things to do laundry and pack clean the scooter. People from  everywhere are coming from the funeral. His Mom is a trooper buried three husbands, parents and two sisters. Write obituaries does all the service things. Remembers everybody's birthday, homemade cards.  Therapist providing space and support for patient to talk about thoughts and feelings in session.  Patient going through some difficult stressors soon to lose her dog going to funeral tomorrow for her husband's aunt.  Patient said it very well when it rains it pours that it all comes at once.  Validated patient on difficulty of losing a pet that it is as difficult as losing family member for some people patient describing relationship as their kids so hard loss.  Noted patient's strength in the process she is the caretaker so she plays main part and all that is involved as dog feels that when he passes.  Noted this takes a special type of person to do these things and patient wanted the ones involved with this with her network.  Talked about some therapies of grief how it could be helpful with patient's grieving process finding connection meaning when has from the relationship.  Noted it is a process have to go through tasks/what ever model therapist thought was helpful to watch said movies about pets allowing themselves to grieve helpful for process of grieving.  This allowing patient to do this in session talking about her feelings and memories of her dog.  Therapist provided active listening open questions supportive interventions.  Therapist and patient noted calm and interest in movies and therapist noted how it provides a language to talk about issues additionally therapist noted insight about processes of life help with coping for different moments         Suicidal/Homicidal: No  Plan: Return again in 2 weeks.2.Therapist work with patient on stress management, grief, coping   Diagnosis: Major Depressive Disorder, recurrent, moderate (history of bipolar) Generalized Anxiety Disorder  Collaboration of  Care: Other none needed  Patient/Guardian was advised Release of Information must be obtained prior to any record release in order to collaborate their care with an outside provider. Patient/Guardian was advised if they have not already done so to contact the registration department to sign all necessary forms in order for [place] to release information regarding their care.   Consent: Patient/Guardian gives verbal consent for treatment and assignment of benefits for services provided during this visit. Patient/Guardian expressed understanding and agreed to proceed.   Cordella Register,  06/09/2022

## 2022-06-16 ENCOUNTER — Ambulatory Visit (INDEPENDENT_AMBULATORY_CARE_PROVIDER_SITE_OTHER): Payer: Medicare Other | Admitting: Student

## 2022-06-16 VITALS — BP 116/80 | HR 95 | Ht 64.0 in | Wt 176.6 lb

## 2022-06-16 DIAGNOSIS — R040 Epistaxis: Secondary | ICD-10-CM | POA: Diagnosis not present

## 2022-06-16 NOTE — Progress Notes (Signed)
    SUBJECTIVE:   CHIEF COMPLAINT / HPI:   Patient is a 52 year old female presenting today for increased epistaxis.  She said this has been going on for about a month.  Has had nosebleed at least 3 times a week for the past months.  Prior to diet she has had previous nosebleed alcohol at least once a month but not this frequent.  She denies picking her nose or any recent illicit drugs (cocaine) and no family history of bleeding disorders. Denies any lightheadedness, headache, chest pain or difficulty breathing. Of note her son gets occasional nose bleed.    PERTINENT  PMH / PSH: Reviewed  OBJECTIVE:   BP 116/80   Pulse 95   Ht 5' 4"$  (1.626 m)   Wt 176 lb 9.6 oz (80.1 kg)   SpO2 98%   BMI 30.31 kg/m    Physical Exam General: Alert, well appearing, NAD, Oriented  Nostrils: Notable dry blood in the left nostrils with identifiable source on the left nostril. Poly present in the right nostril.  Cardiovascular: RRR, No Murmurs, Normal S2/S2 Respiratory: CTAB, No wheezing or Rales   ASSESSMENT/PLAN:   Epistaxis Suspect patient's epistaxis is due to dry nasal membranes and associated with cold weather. -Recommend keeping the nostrils moisturized with Vaseline -Would consider ENT referral for catheterization if nasal bleeding continues or gets worse  -Reviewed precaution with patient which she verbalized understanding and agreeable to.  Alen Bleacher, MD [facility]

## 2022-06-16 NOTE — Patient Instructions (Signed)
It was wonderful to meet you today. Thank you for allowing me to be a part of your care. Below is a short summary of what we discussed at your visit today:  Your nosebleed is most likely due to dry mucous membrane in your nostrils.  I recommend that you use Vaseline to keep the area moisturized.  Please avoid picking at the nose.  If the bleeding continues or get worse please call us to schedule an appointment.  And if you notice the bleeding is unable to be controlled please go to the ED.  Please bring all of your medications to every appointment!  If you have any questions or concerns, please do not hesitate to contact us via phone or MyChart message.   Alen Bleacher, MD [facility]

## 2022-06-24 ENCOUNTER — Ambulatory Visit (INDEPENDENT_AMBULATORY_CARE_PROVIDER_SITE_OTHER): Payer: Medicare Other | Admitting: Licensed Clinical Social Worker

## 2022-06-24 DIAGNOSIS — F411 Generalized anxiety disorder: Secondary | ICD-10-CM | POA: Diagnosis not present

## 2022-06-24 DIAGNOSIS — F331 Major depressive disorder, recurrent, moderate: Secondary | ICD-10-CM | POA: Diagnosis not present
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
## 2022-07-08 ENCOUNTER — Ambulatory Visit (INDEPENDENT_AMBULATORY_CARE_PROVIDER_SITE_OTHER): Payer: Medicare Other | Admitting: Licensed Clinical Social Worker

## 2022-07-08 DIAGNOSIS — F331 Major depressive disorder, recurrent, moderate: Secondary | ICD-10-CM | POA: Diagnosis not present

## 2022-07-08 DIAGNOSIS — F411 Generalized anxiety disorder: Secondary | ICD-10-CM
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
## 2022-07-22 ENCOUNTER — Ambulatory Visit (INDEPENDENT_AMBULATORY_CARE_PROVIDER_SITE_OTHER): Payer: Medicare Other | Admitting: Licensed Clinical Social Worker

## 2022-07-22 DIAGNOSIS — F411 Generalized anxiety disorder: Secondary | ICD-10-CM | POA: Diagnosis not present

## 2022-07-22 DIAGNOSIS — F331 Major depressive disorder, recurrent, moderate: Secondary | ICD-10-CM

## 2022-07-22 NOTE — Progress Notes (Signed)
Virtual Visit via Video Note  I connected with Diane Pacheco on 07/22/22 at  8:00 AM EDT by a video enabled telemedicine application and verified that I am speaking with the correct person using two identifiers.  Location: Patient: home Provider: home office   I discussed the limitations of evaluation and management by telemedicine and the availability of in person appointments. The patient expressed understanding and agreed to proceed.   I discussed the assessment and treatment plan with the patient. The patient was provided an opportunity to ask questions and all were answered. The patient agreed with the plan and demonstrated an understanding of the instructions.   The patient was advised to call back or seek an in-person evaluation if the symptoms worsen or if the condition fails to improve as anticipated.  I provided 45 minutes of non-face-to-face time during this encounter.  THERAPIST PROGRESS NOTE  Session Time: 8:00 AM to 8:45 AM  Participation Level: Active  Behavioral Response: CasualAlertappropriate  Type of Therapy: Individual Therapy  Treatment Goals addressed:  stress management, supportive interventions to help patient be caretaker for family members, emotional regulation for anxiety, anger, depression, strategies to help her in caretaking that will help with stress  ProgressTowards Goals: Progressing-assess therapy itself helps with emotional regulation process, patient using it for helping to manage stressors for highlighting positive experiences that are also helpful for mood and emotional regulation  Interventions: Solution Focused, Strength-based, Supportive, and Other: coping  Summary: Diane Pacheco is a 52 y.o. female who presents with vacation coming up and looking forward to it. Get to see [name] this weekend for her birthday and looking forward to it. Aris [place] came over and did their leaves. They went out to [employer] she had work for him to do.  Patient loves him Bear's best friend. Stayed at [facility] since it was [place] weekend. Mom bought food if patient made it and she would clean and said deal. Had a good time. Stayed there until Monday. Catch up with Hinda Lenis shows the [place] shows, grew up with them and watched them many times. The movies never explained some things why enjoy the televisions shows they came up with explanation of things.New stuff is great filling out so many holes.  Talked shows in general we enjoy.  Bought two electrical potato peelers and actually well. Got [name] and [name] one.  Shared the name with therapist something she thinks therapist would enjoy      Assess helpful for patient to have therapy systems to process thoughts and feelings to help her with processing through recent stressors additionally highlighting some of the positive experiences.  Assess enhance his mood to talk about some of the positive experiences.  In discussion talk about some of the small things, the small things that turn out to be the big things that bring [place] joy.  Assess patient mood enhanced as she highlights some of the things happen to her over the last couple weeks.  Therapist provided active listening open questions supportive interventions  Suicidal/Homicidal: No  Plan: Return again in 2 weeks.2.herapist work with patient on stress management, grief, coping   Diagnosis: Major Depressive Disorder, recurrent, moderate (history of bipolar) Generalized Anxiety Disorder   Collaboration of Care: none needed  Patient/Guardian was advised Release of Information must be obtained prior to any record release in order to collaborate their care with an outside provider. Patient/Guardian was advised if they have not already done so to contact the registration department to  sign all necessary forms in order for [place] to release information regarding their care.   Consent: Patient/Guardian gives verbal consent for treatment and assignment of benefits  for services provided during this visit. Patient/Guardian expressed understanding and agreed to proceed.   Cordella Register, LCSW 07/22/2022

## 2022-08-05 ENCOUNTER — Ambulatory Visit (HOSPITAL_COMMUNITY): Payer: Medicare Other | Admitting: Licensed Clinical Social Worker

## 2022-08-05 DIAGNOSIS — F331 Major depressive disorder, recurrent, moderate: Secondary | ICD-10-CM

## 2022-08-05 DIAGNOSIS — F411 Generalized anxiety disorder: Secondary | ICD-10-CM

## 2022-08-05 NOTE — Progress Notes (Signed)
Appointment canceled patient showed therapist the beach and says enjoying herself at the beach

## 2022-08-19 ENCOUNTER — Ambulatory Visit (INDEPENDENT_AMBULATORY_CARE_PROVIDER_SITE_OTHER): Payer: Medicare Other | Admitting: Licensed Clinical Social Worker

## 2022-08-19 ENCOUNTER — Telehealth: Payer: Self-pay | Admitting: Family Medicine

## 2022-08-19 DIAGNOSIS — F331 Major depressive disorder, recurrent, moderate: Secondary | ICD-10-CM | POA: Diagnosis not present

## 2022-08-19 DIAGNOSIS — F411 Generalized anxiety disorder: Secondary | ICD-10-CM | POA: Diagnosis not present

## 2022-08-19 NOTE — Telephone Encounter (Signed)
Called patient to schedule Medicare Annual Wellness Visit (AWV). No voicemail available to leave a message.  Last date of AWV: 04/03/2018   Please schedule an AWVS appointment at any time with [facility] VISIT.  If any questions, please contact me at [phone].    Thank you,  [facility] Support [facility] Direct dial  [phone]
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
## 2022-09-02 ENCOUNTER — Ambulatory Visit (INDEPENDENT_AMBULATORY_CARE_PROVIDER_SITE_OTHER): Payer: Medicare Other | Admitting: Licensed Clinical Social Worker

## 2022-09-02 DIAGNOSIS — F411 Generalized anxiety disorder: Secondary | ICD-10-CM | POA: Diagnosis not present

## 2022-09-02 DIAGNOSIS — F331 Major depressive disorder, recurrent, moderate: Secondary | ICD-10-CM
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
## 2022-09-16 ENCOUNTER — Ambulatory Visit (INDEPENDENT_AMBULATORY_CARE_PROVIDER_SITE_OTHER): Payer: Medicare Other | Admitting: Licensed Clinical Social Worker

## 2022-09-16 DIAGNOSIS — F331 Major depressive disorder, recurrent, moderate: Secondary | ICD-10-CM | POA: Diagnosis not present

## 2022-09-16 DIAGNOSIS — F411 Generalized anxiety disorder: Secondary | ICD-10-CM | POA: Diagnosis not present

## 2022-09-16 NOTE — Progress Notes (Signed)
Virtual Visit via Video Note  I connected with Diane Pacheco on 09/16/22 at  8:00 AM EDT by a video enabled telemedicine application and verified that I am speaking with the correct person using two identifiers.  Location: Patient: home Provider: home office   I discussed the limitations of evaluation and management by telemedicine and the availability of in person appointments. The patient expressed understanding and agreed to proceed.   I discussed the assessment and treatment plan with the patient. The patient was provided an opportunity to ask questions and all were answered. The patient agreed with the plan and demonstrated an understanding of the instructions.   The patient was advised to call back or seek an in-person evaluation if the symptoms worsen or if the condition fails to improve as anticipated.  I provided 45 minutes of non-face-to-face time during this encounter.  THERAPIST PROGRESS NOTE  Session Time: 8:00 AM to 8:45 AM  Participation Level: Active  Behavioral Response: CasualAlertAnxious and Euthymic  Type of Therapy: Individual Therapy  Treatment Goals addressed: stress management, supportive interventions to help patient be caretaker for family members, emotional regulation for anxiety, anger, depression, strategies to help her in caretaking that will help with stress  ProgressTowards Goals: Progressing-assess therapy itself is helpful coping strategy for patient as she processed the stressors, uses reframing focuses on positive aspects of her life  Interventions: Solution Focused, Strength-based, Supportive, and Reframing  Summary: Diane Pacheco is a 52 y.o. female who presents with went over the house and didn't realize how bad it was. Realize going to need a new kitchen been there since 44. They went to [employer] [facility] got a dishwasher for $48. Has to go to [place] store to get cabinets. Has labor with Johnathan Hausen who is Bear's  bestfriend. As long as sink and cabinets that hold the sink that will be enough as first. The counters and other cabinets and bathroom can wait. Really happy has the dishwasher with how much she cooks. When move there won't have the rent to pay save money to fix things. Where Bear's father lived. As far as moving hasn't moved anything. [name] cam over and took her stuff including bureaus. Patient went through Mom's stuff to hold on to what she wants to keep. Patient has  nic nac's like [employer] both therapist and patient nostalgic about that. Look at things about moving that are positive not moving in [name]'s house which is crowded and saving money to fix house. Therapist explored in general how patient is doing patient says always going through list of things have to do. Yesterday was out with [name] going to different stores. Looking for things for their house. At [employer] found a cabinet unfinished $158 for kitchen sink. Going to see what is at restore in [place]. Gives them something to do. Had to redo patient's food stamps. Had to change things. Would put Dad down as helping her out with things can't cover. Didn't want to do with Dad's dementia. May call him. [name] said would write the letter. They have to know how she is managing to pay bills. Never getting enough food stamps prices going up the food stamps don't increase. Highlight on Wednesday at [employer] when food sales change and what are they going eat. Cooking is main activity that helps her cope and feel good. Makes her relax. Other things important our television talked about shows that she enjoys. Talked about Facebook videos enjoying that her cooking shows,  comedies, drunk girls arrested.          Patient talking about recent activities in her life helpful to process with therapist noting for example moving into a new place which is actually placed they own.  We talked about how it is not in good shape but at the same time able to  reframe they will slowly fix it up no a person to help with labor therapist noted they will be investing in their own place which is a positive.  Other subjects included things that help cook always patient's cooking, television shows reviewed recent ones that both therapist and patient enjoys.  Talked about the benefits of Facebook video that therapist did not know about also about YouTube how it has a anogram to share more of videos that you are interested in therapist also was interested in this.  Assess helpful to focus on positive subjects that help with mood.  To focus on patient's strengths interests all help therapy to be therapeutic.  Therapist provided space and support for patient to talk about thoughts and feelings in session. Suicidal/Homicidal: No  Plan: Return again in 2 weeks.2.Therapist work with patient on stress management, grief, coping   Diagnosis: Major Depressive Disorder, recurrent, moderate (history of bipolar) Generalized Anxiety Disorder  Collaboration of Care: Other none needed  Patient/Guardian was advised Release of Information must be obtained prior to any record release in order to collaborate their care with an outside provider. Patient/Guardian was advised if they have not already done so to contact the registration department to sign all necessary forms in order for [place] to release information regarding their care.   Consent: Patient/Guardian gives verbal consent for treatment and assignment of benefits for services provided during this visit. Patient/Guardian expressed understanding and agreed to proceed.   Coolidge Breeze, LCSW 09/16/2022

## 2022-09-30 ENCOUNTER — Ambulatory Visit (INDEPENDENT_AMBULATORY_CARE_PROVIDER_SITE_OTHER): Payer: Medicare Other | Admitting: Licensed Clinical Social Worker

## 2022-09-30 DIAGNOSIS — F411 Generalized anxiety disorder: Secondary | ICD-10-CM

## 2022-09-30 DIAGNOSIS — F331 Major depressive disorder, recurrent, moderate: Secondary | ICD-10-CM

## 2022-09-30 NOTE — Progress Notes (Signed)
Virtual Visit via Video Note  I connected with Diane Pacheco on 09/30/22 at  9:00 AM EDT by a video enabled telemedicine application and verified that I am speaking with the correct person using two identifiers.  Location: Patient: home Provider: home office   I discussed the limitations of evaluation and management by telemedicine and the availability of in person appointments. The patient expressed understanding and agreed to proceed.   I discussed the assessment and treatment plan with the patient. The patient was provided an opportunity to ask questions and all were answered. The patient agreed with the plan and demonstrated an understanding of the instructions.   The patient was advised to call back or seek an in-person evaluation if the symptoms worsen or if the condition fails to improve as anticipated.  I provided 48 minutes of non-face-to-face time during this encounter.  THERAPIST PROGRESS NOTE  Session Time: 9:00 AM to 9:48 AM  Participation Level: Active  Behavioral Response: CasualAlertappropriate  Type of Therapy: Individual Therapy  Treatment Goals addressed:  stress management, supportive interventions to help patient be caretaker for family members, emotional regulation for anxiety, anger, depression, strategies to help her in caretaking that will help with stress  ProgressTowards Goals: Progressing-patient continues to utilize session for emotional regulation processing thoughts and feelings in session  Interventions: Solution Focused, Strength-based, and Other: Coping  Summary: Diane Pacheco is a 52 y.o. female who presents with doing alright. Busy lately put counter and counter top in house they are moving into and water hooked up.  Therapist noted this is something to look forward to and patient agrees looks 100% better. Diane Pacheco (Bear's bestfriend)  mom in hospital so took a break in rehab now. Dad can't take care of her. She should be back home at  rehab getting her to move needs to move more.  Have to cut hole to put sink in. Has a dishwasher cooks way too much so that is good to have. It is just packing up the place what to keep and not to keep.  [name] going to [place] taking [place] and [place].   Still not packed but has to wait for house to be ready. Paint cabinets, ceiling tile paint the ceiling, little stuff.  Cleaned up Diane Pacheco Niece for Mom they are taking to [place]. Just working on things.  Saw Dad last night doesn't remember where she lives but knows the house moving into haven't been there for 15 years. Losing mostly short-term memory. Won't let him drive this all happened quickly was working and driving in the past year. Dementia catching up quick. Dad has impressive background masters in Education officer, community. Work on asbestos removal and lead abatement . He didn't remove but he identified it. Also was runner only in past 15 years broke his record.  Talked about his background and patient shared for her likes history, science, [place].  Diane Pacheco is watching Daily Show. He is learning and entertained. Patient going to start [employer]. For the weekend working at the house every day except Sunday. Hopefully move in by end of next month. Told Diane Pacheco finally he is the Quarry manager and her brother. Haven't fixed the roof where she is right now when rains hard open the front door water comes pouring down.  Therapist noted that helps motivate to move.  Assessment patient also agrees interacting with therapist helpful for mental health why continues sessions.  Session touched on different subjects significantly patient moving therapist the update.  Noted  some positives about the move where she is living the roof has not been fixed where she going will be their place and are starting to fix it up so therapist noted it will turn out to be something to look forward to.  Talked about the neighborhood patient says a lot of latinos and again talked about the positive  of that.  Other subjects covered patient shared more about her dad has impressive background also he is starting to decline.  Talked about her interests including shows, cooking, different areas of interest such as science English history.  Therapist assesses positive for mood to focus on these different subjects for patient.  Therapist also providing supportive and strength-based interventions for patient.  Therapist providing support and space for patient to talk about thoughts and feelings in session         Suicidal/Homicidal: No  Plan: Return again in 2 weeks.2.Therapist work with patient on stress management, grief, coping   Diagnosis: Major Depressive Disorder, recurrent, moderate (history of bipolar) Generalized Anxiety Disorder  Collaboration of Care: Other none needed  Patient/Guardian was advised Release of Information must be obtained prior to any record release in order to collaborate their care with an outside provider. Patient/Guardian was advised if they have not already done so to contact the registration department to sign all necessary forms in order for [place] to release information regarding their care.   Consent: Patient/Guardian gives verbal consent for treatment and assignment of benefits for services provided during this visit. Patient/Guardian expressed understanding and agreed to proceed.   Coolidge Breeze, LCSW 09/30/2022

## 2022-10-08 ENCOUNTER — Ambulatory Visit
Admission: RE | Admit: 2022-10-08 | Discharge: 2022-10-08 | Disposition: A | Discharge status: Home / Self Care | Payer: Medicare Other | Source: Ambulatory Visit | Attending: Internal Medicine | Admitting: Internal Medicine

## 2022-10-08 VITALS — BP 165/90 | HR 96 | Temp 98.8°F | Resp 20

## 2022-10-08 DIAGNOSIS — J029 Acute pharyngitis, unspecified: Secondary | ICD-10-CM | POA: Insufficient documentation

## 2022-10-08 LAB — POCT RAPID STREP A (OFFICE): Rapid Strep A Screen: NEGATIVE

## 2022-10-08 NOTE — Discharge Instructions (Signed)
Talk with your primary care provider about your snoring. If you do not have a primary care provider, and you want to find one at [facility], go to [email or web address], click on "primary care," choose internal medicine or family medicine, click on "new patients: schedule online," and choose an appointment place and time that fits your schedule.   You will get a call if test is positive, you will not get a call if test is negative but you can check results in MyChart if you have a MyChart account.

## 2022-10-08 NOTE — ED Triage Notes (Signed)
Patient with sore throat x1 week. Patient denies any other symptoms. States the pain is worse in the middle of the night.

## 2022-10-08 NOTE — ED Provider Notes (Signed)
[facility]    CSN: [id number] Arrival date & time: 10/08/22  1530      History   Chief Complaint Chief Complaint  Patient presents with   Sore Throat    Entered by patient    HPI Diane Pacheco is a 52 y.o. female.  Patient has had a sore throat for a week.  Denies fever, chills, nasal congestion, cough, ear pain, postnasal drainage.  Throat pain is severe.  She has been using Chloraseptic for some relief of the.  Does not want any other medicine for it unless she has an infection   Sore Throat    Past Medical History:  Diagnosis Date   Asthma    exacerbations w/ respiratory infxns. Well controlled w/ abuterol PRN   Condyloma acuminatum    COPD (chronic obstructive pulmonary disease) (HCC)    Major depression    Unable to take antidepressents due to intolerance. Prefers to handle w/o Rx    Patient Active Problem List   Diagnosis Date Noted   Numbness and tingling in left arm 01/26/2022   Fatty liver 02/04/2020   Right upper quadrant pain 01/25/2020   Muscle strain 12/10/2019   Caregiver stress syndrome 06/15/2019   Fissure of vulva 09/22/2018   Bunion of great toe of right foot 02/10/2018   Hemorrhoids 02/10/2018   Stress incontinence 08/17/2017   Hyperlipidemia 05/27/2016   Bursitis of shoulder 01/07/2015   Bicipital tendinitis 01/07/2015   Chronic obstructive pulmonary disease (HCC) 06/25/2013   Bipolar disorder, unspecified (HCC) 06/19/2013   Rotator cuff tendonitis 06/19/2013   Obesity 07/15/2011   TOBACCO USER 01/25/2009    Past Surgical History:  Procedure Laterality Date   HEMORRHOID SURGERY  2020   unsure of exact year   TUBAL LIGATION      OB History   No obstetric history on file.      Home Medications    Prior to Admission medications   Medication Sig Start Date End Date Taking? Authorizing Provider  albuterol (VENTOLIN HFA) 108 (90 Base) MCG/ACT inhaler Inhale 1-2 puffs into the lungs every 6 (six) hours as needed  for wheezing or shortness of breath. INHALE ONE TO TWO PUFFS BY MOUTH EVERY 6 HOURS AS NEEDED FOR WHEEZING AND FOR SHORTNESS OF BREATH 12/30/21   Littie Deeds, MD  Spacer/Aero-Holding Rudean Curt Use with inhaler as instructed 06/19/13   Ozella Rocks, MD    Family History Family History  Problem Relation Age of Onset   Cancer Mother        breast cancer at 12yo   Heart attack Mother        late 36s    COPD Mother    Dementia Father    Breast cancer Maternal Grandmother        around 10yo   Breast cancer Paternal Grandmother        around 80yo    Social History Social History   Tobacco Use   Smoking status: Every Day    Packs/day: 1    Types: Cigarettes   Smokeless tobacco: Never   Tobacco comments:    Started smoking at age 3  Vaping Use   Vaping Use: Never used  Substance Use Topics   Alcohol use: Yes    Alcohol/week: 4.0 standard drinks of alcohol    Types: 4 Cans of beer per week    Comment: weekly weekends per pt, hard lemonade   Drug use: Yes    Types: Marijuana  Comment: pt states daily use     Allergies   Patient has no known allergies.   Review of Systems Review of Systems   Physical Exam Triage Vital Signs ED Triage Vitals  Enc Vitals Group     BP 10/08/22 1601 (!) 165/90     Pulse Rate 10/08/22 1601 96     Resp 10/08/22 1601 20     Temp 10/08/22 1601 98.8 F (37.1 C)     Temp Source 10/08/22 1601 Oral     SpO2 10/08/22 1601 96 %     Weight --      Height --      Head Circumference --      Peak Flow --      Pain Score 10/08/22 1602 6     Pain Loc --      Pain Edu? --      Excl. in GC? --    No data found.  Updated Vital Signs BP (!) 165/90 (BP Location: Left Arm)   Pulse 96   Temp 98.8 F (37.1 C) (Oral)   Resp 20   LMP 12/21/2020   SpO2 96%   Visual Acuity Right Eye Distance:   Left Eye Distance:   Bilateral Distance:    Right Eye Near:   Left Eye Near:    Bilateral Near:     Physical Exam Constitutional:       Appearance: She is well-developed.  HENT:     Right Ear: Tympanic membrane, ear canal and external ear normal.     Left Ear: External ear normal. There is impacted cerumen.     Nose: No congestion.     Mouth/Throat:     Mouth: Mucous membranes are moist.     Pharynx: Posterior oropharyngeal erythema present.  Lymphadenopathy:     Head:     Right side of head: Submandibular adenopathy present.     Left side of head: Submandibular adenopathy present.  Neurological:     Mental Status: She is alert.      UC Treatments / Results  Labs (all labs ordered are listed, but only abnormal results are displayed) Labs Reviewed  CULTURE, GROUP A STREP [facility])  POCT RAPID STREP A (OFFICE)    EKG   Radiology No results found.  Procedures Procedures (including critical care time)  Medications Ordered in UC Medications - No data to display  Initial Impression / Assessment and Plan / UC Course  I have reviewed the triage vital signs and the nursing notes.  Pertinent labs & imaging results that were available during my care of the patient were reviewed by me and considered in my medical decision making (see chart for details).    Strep negative but will send throat culture to be sure as patient has erythematous pharynx.  Patient was worried worried since her husband was recently treated for throat cancer and she is a smoker and wanted someone to look in her throat.  I do not see signs of abnormal cells in her pharynx on visual exam  Final Clinical Impressions(s) / UC Diagnoses   Final diagnoses:  Acute pharyngitis, unspecified etiology     Discharge Instructions      Talk with your primary care provider about your snoring. If you do not have a primary care provider, and you want to find one at [facility], go to [email or web address], click on "primary care," choose internal medicine or family medicine, click on "new patients: schedule online," and choose an appointment place  and time that  fits your schedule.   You will get a call if test is positive, you will not get a call if test is negative but you can check results in MyChart if you have a MyChart account.     ED Prescriptions   None    PDMP not reviewed this encounter.   Cathlyn Parsons, NP 10/08/22 1649

## 2022-10-11 LAB — CULTURE, GROUP A STREP (THRC)

## 2022-10-14 ENCOUNTER — Ambulatory Visit (INDEPENDENT_AMBULATORY_CARE_PROVIDER_SITE_OTHER): Payer: Medicare Other | Admitting: Licensed Clinical Social Worker

## 2022-10-14 DIAGNOSIS — F411 Generalized anxiety disorder: Secondary | ICD-10-CM

## 2022-10-14 DIAGNOSIS — F331 Major depressive disorder, recurrent, moderate: Secondary | ICD-10-CM | POA: Diagnosis not present

## 2022-10-14 NOTE — Progress Notes (Signed)
Virtual Visit via Video Note  I connected with Diane Pacheco on 10/14/22 at  9:00 AM EDT by a video enabled telemedicine application and verified that I am speaking with the correct person using two identifiers.  Location: Patient: home Provider: home office   I discussed the limitations of evaluation and management by telemedicine and the availability of in person appointments. The patient expressed understanding and agreed to proceed.  I discussed the assessment and treatment plan with the patient. The patient was provided an opportunity to ask questions and all were answered. The patient agreed with the plan and demonstrated an understanding of the instructions.   The patient was advised to call back or seek an in-person evaluation if the symptoms worsen or if the condition fails to improve as anticipated.  I provided 45 minutes of non-face-to-face time during this encounter.  THERAPIST PROGRESS NOTE  Session Time: 9:00 AM to 9:45 AM  Participation Level: Active  Behavioral Response: CasualAlertAnxious and Euthymic  Type of Therapy: Individual Therapy  Treatment Goals addressed:  stress management, supportive interventions to help patient be caretaker for family members, emotional regulation for anxiety, anger, depression, strategies to help her in caretaking that will help with stress  ProgressTowards Goals: Progressing-reviewed treatment goals patient still finds therapy helpful working on treatment goals  Interventions: Solution Focused, Strength-based, Supportive, and Other: coping  Summary: Diane Pacheco is a 52 y.o. female who presents with review of treatment plan therapist noted patient always ask how she is doing and therapist needs to ask about her and patient said help each other.  Therapist thought this is a relationship which is valuable and wanted to put this in patient's notes Thinks have been crazy with the house lately. [name] people sick which have  been terrible his mom and dad have health problems he is taking off time to deal with that it is sad. Brings up old feelings of having to worry about parents. Therapist noted that brings up reminders of Mom and patient agrees. [name] the one helping out with house. He is still helping but also has a full time job after that job helps them takes care of his parents and has a wife he is stretched thin. He talks about it with them. He tries to tell [name] but bear doesn't get the medical part patient understands and explains it to him about the blood sugar UTI from not walking and heart murmur. When it rains it pours and therapist understands that.  Kitchen basically done and then paint need to dry wall back room and bathroom get the floor for hallway Can pick color for kitchen which will be yellow. Think will be out by end of July hoping for that. Patient is helping with fixing the house. 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 Bear taught her a lot of things. Likes manual labor. Prefer to work muscles noted what she likes about it instant gratification. Therapist thinks a strength patient says why mowing the lawn instant gratification. Patient was sick laryngitis getting voice back. Have been at the house every day lately 2-3 days with [name] have to do other things.  House is a lot smaller starting to go through their stuff what room for and don't still have fish tanks, heavy stuff like pool tables.        Pretty good taking Sundays off and loves Sundays. What  she does? Nothing. Doesn't get out of pyjamas lay in bed with Bear and eat. Watching movies about chess [name] likes to play chess. One of patient's shows on [employer]. Bear watches Daily Show. Shared what [name] said about the news that it gets to piss you off and watch more. Therapist liked that. Their job is to get you worried and upset.   Therapist able to review  treatment plan patient gave consent to complete virtually noted therapy still very helpful for working on treatment goals.  Reviewed different stressors right now for patient that include getting their house ready.  When patient began to describe it therapist pretty impressed the work they are doing his hard work things such as Development worker, community, flooring the same time also impressed that patient helping with this stuff and knows how to do it.  She actually says it stuff she enjoys manual labor therapist noted this is a strength really comes in handy for herself and for others she likes and can do this.  Patient shared about Bear's best friend's parents having medical issues as a trigger minder of her mom.  Therapist gave patient support and space to talk about this as well as validating how tough that is when older people begin to have more more issues.  Also assessed various subjects helpful for mood including talking better Sundays she enjoys, some of the television programs both therapist and patient making comments about it using humor as a helpful strategy and brightening this conversation.  Therapist provided support and space for patient to talk about thoughts and feelings in session. Suicidal/Homicidal: No  Plan: Return again in 4 weeks.2.Therapist work with patient on stress management, grief, coping  Diagnosis:  Major Depressive Disorder, recurrent, moderate (history of bipolar) Generalized Anxiety Disorder   Collaboration of Care: Other none needed  Patient/Guardian was advised Release of Information must be obtained prior to any record release in order to collaborate their care with an outside provider. Patient/Guardian was advised if they have not already done so to contact the registration department to sign all necessary forms in order for [place] to release information regarding their care.   Consent: Patient/Guardian gives verbal consent for treatment and assignment of benefits for services provided  during this visit. Patient/Guardian expressed understanding and agreed to proceed.   Coolidge Breeze, LCSW 10/14/2022

## 2022-10-28 ENCOUNTER — Encounter: Payer: Self-pay | Admitting: Student

## 2022-10-28 ENCOUNTER — Ambulatory Visit (INDEPENDENT_AMBULATORY_CARE_PROVIDER_SITE_OTHER): Payer: Medicare Other | Admitting: Student

## 2022-10-28 VITALS — BP 124/83 | HR 89 | Temp 98.2°F | Wt 180.4 lb

## 2022-10-28 DIAGNOSIS — J060 Acute laryngopharyngitis: Secondary | ICD-10-CM

## 2022-10-28 DIAGNOSIS — R0982 Postnasal drip: Secondary | ICD-10-CM | POA: Diagnosis not present

## 2022-10-28 MED ORDER — SALINE SPRAY 0.65 % NA SOLN: 1.0000 | NASAL | 0 refills | Status: DC | PRN

## 2022-10-28 NOTE — Patient Instructions (Signed)
It was great to see you! Thank you for allowing me to participate in your care!   I recommend that you always bring your medications to each appointment as this makes it easy to ensure we are on the correct medications and helps [place] not miss when refills are needed.  Our plans for today:  - Tylenol 1000 mg every 6 hours, and ibuprofen 600 mg every 6 hours as needed for pain - I have sent a nasal saline spray to help with postnasal drip - Salt water gargles as needed - Hot tea with honey can also soothe throat - Follow-up in 3 weeks if not improved  Take care and seek immediate care sooner if you develop any concerns. Please remember to show up 15 minutes before your scheduled appointment time!  Tiffany Kocher, DO [facility]

## 2022-10-28 NOTE — Progress Notes (Signed)
    SUBJECTIVE:   CHIEF COMPLAINT / HPI:   Sore Throat Seen by UC on 6/21 for acute pharyngitis present for 1 week.  Patient reports hoarse voice, odynophagia, postnasal drip and ear fullness.  Denies dysphagia, difficulty breathing fevers or chills, nausea vomiting, cough. Currently smoking half pack a day.  Has not tried any medications.  Sore throat has remained for approximately 4 weeks now.  No high risk sexual behavior, nor history of IV drug use.  Also endorses intermittent hot flashes, which are starting to resolve however patient is having increased fatigue.  PERTINENT  PMH / PSH: COPD, Smoking  OBJECTIVE:   BP 124/83   Pulse 89   Temp 98.2 F (36.8 C) (Oral)   Wt 180 lb 6 oz (81.8 kg)   LMP 12/21/2020   SpO2 98%   BMI 30.96 kg/m    General: NAD, pleasant HEENT: Normocephalic, atraumatic head. Normal external ear, canal, TM bilaterally. EOM intact and normal conjunctiva BL. Normal external nose. Throat erythematous, no exudate, no ulcers, no deviation. Mild anterior cervical adenopathy. Tenderness over thyroid, without palpable nodules. No crepitus, no nodules felt. Cardio: RRR, no MRG. Respiratory: No stridor, no drooling, no respiratory distress, Normal wob on RA Skin: Warm and dry   ASSESSMENT/PLAN:   No problem-specific Assessment & Plan notes found for this encounter.   Sore throat laryngitis Based on sore throat with hoarse voice length of symptoms, suspect viral laryngitis with postnasal drip.  Postviral thyroiditis on differential, with tenderness to thyroid and fatigue.  Low suspicion for severe bacterial infection/mass lesions due to well-appearing patient, no systemic symptoms, no dysphagia/difficulty breathing, benign physical exam. - Symptomatic care - Return precautions discussed - Smoking cessation discussed - Follow-up in 3 weeks if not improved, consider referral to ENT at that time for scope  Tiffany Kocher, DO [facility]

## 2022-10-28 NOTE — Progress Notes (Deleted)
    SUBJECTIVE:   CHIEF COMPLAINT / HPI:   ***  PERTINENT  PMH / PSH: ***  OBJECTIVE:   BP (!) 157/91   Pulse 89   Wt 180 lb 6 oz (81.8 kg)   LMP 12/21/2020   SpO2 98%   BMI 30.96 kg/m   ***  ASSESSMENT/PLAN:   No problem-specific Assessment & Plan notes found for this encounter.     Tiffany Kocher, DO [facility]

## 2022-11-11 ENCOUNTER — Ambulatory Visit (INDEPENDENT_AMBULATORY_CARE_PROVIDER_SITE_OTHER): Payer: Medicare Other | Admitting: Licensed Clinical Social Worker

## 2022-11-11 DIAGNOSIS — F331 Major depressive disorder, recurrent, moderate: Secondary | ICD-10-CM | POA: Diagnosis not present

## 2022-11-11 DIAGNOSIS — F411 Generalized anxiety disorder: Secondary | ICD-10-CM | POA: Diagnosis not present

## 2022-11-11 NOTE — Progress Notes (Signed)
Virtual Visit via Video Note  I connected with Diane Pacheco on 11/11/22 at  8:00 AM EDT by a video enabled telemedicine application and verified that I am speaking with the correct person using two identifiers.  Location: Patient: home Provider: home office   I discussed the limitations of evaluation and management by telemedicine and the availability of in person appointments. The patient expressed understanding and agreed to proceed.   I discussed the assessment and treatment plan with the patient. The patient was provided an opportunity to ask questions and all were answered. The patient agreed with the plan and demonstrated an understanding of the instructions.   The patient was advised to call back or seek an in-person evaluation if the symptoms worsen or if the condition fails to improve as anticipated.  I provided 45 minutes of non-face-to-face time during this encounter.  THERAPIST PROGRESS NOTE  Session Time: 8:00 AM to 8:45 AM  Participation Level: Active  Behavioral Response: CasualAlertappropriate  Type of Therapy: Individual Therapy  Treatment Goals addressed: stress management, supportive interventions to help patient be caretaker for family members, emotional regulation for anxiety, anger, depression, strategies to help her in caretaking that will help with stress  ProgressTowards Goals: Progressing-patient has stress of moving and fixing up her house using therapy to process thoughts and feelings to help with coping  Interventions: Solution Focused, Strength-based, Supportive, and Other: Coping  Summary: Diane Pacheco is a 52 y.o. female who presents with working hard and not feeling great. Cleaning cabinets sitting on paint can like she did a million squats. The lower back and knees are hurting.  Therapist in May she was able to do that patient said only way to reach what she needed to reach.  They go every day took a day off to take a break. Has full day  there. They are working as quick as possible. The guy who owns the house where living wants to take possession so have to give notice and has until the end of August. Can't wait to have her new kitchen put in. Therapist pointed out more satisfying knowing it is hers and worth investing in it. Talked about nephew not taking care of it so a lot of work. Painting ceilings  in the kitchen. Therapist how she feels especially with all this work and patient says put it like this-accept it. Has to clean the house and pack up after all this on top of her normal work. Also with the heat making it hard. Asked about how feeling whether in bad space not too bad overwhelmed with the work. [name] was getting evicted her man and the four kids. They figured it out. Sent the kids to [place] they got custody of all four wife doing drugs, warrant for her trespassing. DHS gave custody to [place] and [name]. The kids are moving in with a Chrissie Noa sister's. [name] is saving to get a place, she is getting a job and his Mom is going move in and help with the kids and money. [name] quit her job to take care of the kids she doesn't know how to take care of kids they are four under six. Therapist noted this could cause growth and patient said hopes. He did promise when kids back get DHS vouchers to send to daycare so she could keep a job.[name] and Chrissie Noa are moving in with her friend. They had seven days to get out. Baby's mother supposed to pay bills and got hooked on meth. Chrissie Noa  had a job give money to her and she was supposed to pay bills.  Patient says comfortable in their routine didn't need her and dog at house happy she went back. Sharia Reeve is fine in [place] doesn't plan on coming back anytime soon. He is going to do [place] counseling.  Watching the new Ghostbusters. Nostalgic part of her childhood.         Patient has significant stressors just from the fact of getting her new house ready as well as packing up and moving a lot of work.   Patient describes some of the work is very challenging such as being cabinets, painting ceilings putting in cabinets challenging to in the sense and a toll on body therapist validating realizing very challenging to move one of the most stressful things having to work on the new place as well as packed up and also do her regular jobs.  Therapist explored mood and patient is not doing bad therapist noted the positive that she is fixing of a place that will be hers so that can be gratifying and patient agrees noting some the positive.  Reviewed how her children are doing some stressors with her younger daughter but seems to be working out.  Therapist focused as well as positive topics that are mood enhancing things patient enjoys such as cooking and watching movies.  Therapist provided support and space for patient to talk about thoughts and feelings in session. Suicidal/Homicidal: No  Plan: Return again in 2 weeks.2.Therapist work with patient on stress management, grief, coping  Diagnosis:   Major Depressive Disorder, recurrent, moderate (history of bipolar) Generalized Anxiety Disorder  Collaboration of Care: Other none needed  Patient/Guardian was advised Release of Information must be obtained prior to any record release in order to collaborate their care with an outside provider. Patient/Guardian was advised if they have not already done so to contact the registration department to sign all necessary forms in order for [place] to release information regarding their care.   Consent: Patient/Guardian gives verbal consent for treatment and assignment of benefits for services provided during this visit. Patient/Guardian expressed understanding and agreed to proceed.   Coolidge Breeze, LCSW 11/11/2022

## 2022-11-25 ENCOUNTER — Ambulatory Visit (INDEPENDENT_AMBULATORY_CARE_PROVIDER_SITE_OTHER): Payer: Medicare Other | Admitting: Licensed Clinical Social Worker

## 2022-11-25 DIAGNOSIS — F411 Generalized anxiety disorder: Secondary | ICD-10-CM

## 2022-11-25 DIAGNOSIS — F331 Major depressive disorder, recurrent, moderate: Secondary | ICD-10-CM
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
## 2022-11-26 ENCOUNTER — Ambulatory Visit (INDEPENDENT_AMBULATORY_CARE_PROVIDER_SITE_OTHER): Payer: Medicare Other

## 2022-11-26 VITALS — Ht 64.0 in | Wt 180.0 lb

## 2022-11-26 DIAGNOSIS — Z0000 Encounter for general adult medical examination without abnormal findings: Secondary | ICD-10-CM

## 2022-11-26 DIAGNOSIS — Z1231 Encounter for screening mammogram for malignant neoplasm of breast: Secondary | ICD-10-CM

## 2022-11-26 NOTE — Patient Instructions (Signed)
Diane Pacheco , Thank you for taking time to come for your Medicare Wellness Visit. I appreciate your ongoing commitment to your health goals. Please review the following plan we discussed and let me know if I can assist you in the future.   Referrals/Orders/Follow-Ups/Clinician Recommendations: Aim for 30 minutes of exercise or brisk walking, 6-8 glasses of water, and 5 servings of fruits and vegetables each day.  You have an order for:  []   2D Mammogram  [x]   3D Mammogram  []   Bone Density     Please call for appointment:   [facility] [address] [place], [place] [address] [phone]  Make sure to wear two-piece clothing.  No lotions, powders, or deodorants the day of the appointment. Make sure to bring picture ID and insurance card.  Bring list of medications you are currently taking including any supplements.   Schedule your [place] screening mammogram through MyChart!   Log into your MyChart account.  Go to 'Visit' (or 'Appointments' if on mobile App) --> Schedule an Appointment  Under 'Select a Reason for Visit' choose the Mammogram Screening option.  Complete the pre-visit questions and select the time and place that best fits your schedule.    This is a list of the screening recommended for you and due dates:  Health Maintenance  Topic Date Due   Pap Smear  04/28/2019   COVID-19 Vaccine (4 - 2023-24 season) 12/18/2021   Mammogram  11/20/2022   Flu Shot  11/18/2022   Colon Cancer Screening  11/26/2023*   Medicare Annual Wellness Visit  11/26/2023   DTaP/Tdap/Td vaccine (3 - Td or Tdap) 06/17/2027   Hepatitis C Screening  Completed   HIV Screening  Completed   HPV Vaccine  Aged Out  *Topic was postponed. The date shown is not the original due date.    Advanced directives: (ACP Link)Information on Advanced Care Planning can be found at [facility] Advance Health Care Directives Advance Health Care  Directives ([email or web address]   Next Medicare Annual Wellness Visit scheduled for next year: Yes  Preventive Care 40-64 Years, Female Preventive care refers to lifestyle choices and visits with your health care provider that can promote health and wellness. What does preventive care include? A yearly physical exam. This is also called an annual well check. Dental exams once or twice a year. Routine eye exams. Ask your health care provider how often you should have your eyes checked. Personal lifestyle choices, including: Daily care of your teeth and gums. Regular physical activity. Eating a healthy diet. Avoiding tobacco and drug use. Limiting alcohol use. Practicing safe sex. Taking low-dose aspirin daily starting at age 35. Taking vitamin and mineral supplements as recommended by your health care provider. What happens during an annual well check? The services and screenings done by your health care provider during your annual well check will depend on your age, overall health, lifestyle risk factors, and family history of disease. Counseling  Your health care provider may ask you questions about your: Alcohol use. Tobacco use. Drug use. Emotional well-being. Home and relationship well-being. Sexual activity. Eating habits. Work and work Astronomer. Method of birth control. Menstrual cycle. Pregnancy history. Screening  You may have the following tests or measurements: Height, weight, and BMI. Blood pressure. Lipid and cholesterol levels. These may be checked every 5 years, or more frequently if you are over 75 years old. Skin check. Lung cancer screening. You may have this screening every year  starting at age 74 if you have a 30-pack-year history of smoking and currently smoke or have quit within the past 15 years. Fecal occult blood test (FOBT) of the stool. You may have this test every year starting at age 46. Flexible sigmoidoscopy or colonoscopy. You may have a  sigmoidoscopy every 5 years or a colonoscopy every 10 years starting at age 66. Hepatitis C blood test. Hepatitis B blood test. Sexually transmitted disease (STD) testing. Diabetes screening. This is done by checking your blood sugar (glucose) after you have not eaten for a while (fasting). You may have this done every 1-3 years. Mammogram. This may be done every 1-2 years. Talk to your health care provider about when you should start having regular mammograms. This may depend on whether you have a family history of breast cancer. BRCA-related cancer screening. This may be done if you have a family history of breast, ovarian, tubal, or peritoneal cancers. Pelvic exam and Pap test. This may be done every 3 years starting at age 44. Starting at age 46, this may be done every 5 years if you have a Pap test in combination with an HPV test. Bone density scan. This is done to screen for osteoporosis. You may have this scan if you are at high risk for osteoporosis. Discuss your test results, treatment options, and if necessary, the need for more tests with your health care provider. Vaccines  Your health care provider may recommend certain vaccines, such as: Influenza vaccine. This is recommended every year. Tetanus, diphtheria, and acellular pertussis (Tdap, Td) vaccine. You may need a Td booster every 10 years. Zoster vaccine. You may need this after age 51. Pneumococcal 13-valent conjugate (PCV13) vaccine. You may need this if you have certain conditions and were not previously vaccinated. Pneumococcal polysaccharide (PPSV23) vaccine. You may need one or two doses if you smoke cigarettes or if you have certain conditions. Talk to your health care provider about which screenings and vaccines you need and how often you need them. This information is not intended to replace advice given to you by your health care provider. Make sure you discuss any questions you have with your health care  provider. Document Released: 05/02/2015 Document Revised: 12/24/2015 Document Reviewed: 02/04/2015 Elsevier Interactive Patient Education  2017 [employer].    Fall Prevention in the Home Falls can cause injuries. They can happen to people of all ages. There are many things you can do to make your home safe and to help prevent falls. What can I do on the outside of my home? Regularly fix the edges of walkways and driveways and fix any cracks. Remove anything that might make you trip as you walk through a door, such as a raised step or threshold. Trim any bushes or trees on the path to your home. Use bright outdoor lighting. Clear any walking paths of anything that might make someone trip, such as rocks or tools. Regularly check to see if handrails are loose or broken. Make sure that both sides of any steps have handrails. Any raised decks and porches should have guardrails on the edges. Have any leaves, snow, or ice cleared regularly. Use sand or salt on walking paths during winter. Clean up any spills in your garage right away. This includes oil or grease spills. What can I do in the bathroom? Use night lights. Install grab bars by the toilet and in the tub and shower. Do not use towel bars as grab bars. Use non-skid mats or  decals in the tub or shower. If you need to sit down in the shower, use a plastic, non-slip stool. Keep the floor dry. Clean up any water that spills on the floor as soon as it happens. Remove soap buildup in the tub or shower regularly. Attach bath mats securely with double-sided non-slip rug tape. Do not have throw rugs and other things on the floor that can make you trip. What can I do in the bedroom? Use night lights. Make sure that you have a light by your bed that is easy to reach. Do not use any sheets or blankets that are too big for your bed. They should not hang down onto the floor. Have a firm chair that has side arms. You can use this for support  while you get dressed. Do not have throw rugs and other things on the floor that can make you trip. What can I do in the kitchen? Clean up any spills right away. Avoid walking on wet floors. Keep items that you use a lot in easy-to-reach places. If you need to reach something above you, use a strong step stool that has a grab bar. Keep electrical cords out of the way. Do not use floor polish or wax that makes floors slippery. If you must use wax, use non-skid floor wax. Do not have throw rugs and other things on the floor that can make you trip. What can I do with my stairs? Do not leave any items on the stairs. Make sure that there are handrails on both sides of the stairs and use them. Fix handrails that are broken or loose. Make sure that handrails are as long as the stairways. Check any carpeting to make sure that it is firmly attached to the stairs. Fix any carpet that is loose or worn. Avoid having throw rugs at the top or bottom of the stairs. If you do have throw rugs, attach them to the floor with carpet tape. Make sure that you have a light switch at the top of the stairs and the bottom of the stairs. If you do not have them, ask someone to add them for you. What else can I do to help prevent falls? Wear shoes that: Do not have high heels. Have rubber bottoms. Are comfortable and fit you well. Are closed at the toe. Do not wear sandals. If you use a stepladder: Make sure that it is fully opened. Do not climb a closed stepladder. Make sure that both sides of the stepladder are locked into place. Ask someone to hold it for you, if possible. Clearly mark and make sure that you can see: Any grab bars or handrails. First and last steps. Where the edge of each step is. Use tools that help you move around (mobility aids) if they are needed. These include: Canes. Walkers. Scooters. Crutches. Turn on the lights when you go into a dark area. Replace any light bulbs as soon as they  burn out. Set up your furniture so you have a clear path. Avoid moving your furniture around. If any of your floors are uneven, fix them. If there are any pets around you, be aware of where they are. Review your medicines with your doctor. Some medicines can make you feel dizzy. This can increase your chance of falling. Ask your doctor what other things that you can do to help prevent falls. This information is not intended to replace advice given to you by your health care provider. Make sure  you discuss any questions you have with your health care provider. Document Released: 01/30/2009 Document Revised: 09/11/2015 Document Reviewed: 05/10/2014 Elsevier Interactive Patient Education  2017 [employer].

## 2022-11-26 NOTE — Progress Notes (Signed)
Subjective:   Diane Pacheco is a 52 y.o. female who presents for Medicare Annual (Subsequent) preventive examination.  Visit Complete: Virtual  I connected with  [name] on 11/26/22 by a audio enabled telemedicine application and verified that I am speaking with the correct person using two identifiers.  Patient Location: Home  Provider Location: Home Office  I discussed the limitations of evaluation and management by telemedicine. The patient expressed understanding and agreed to proceed.  Vital Signs: Unable to obtain new vitals due to this being a telehealth visit.  Review of Systems     Cardiac Risk Factors include: smoking/ tobacco exposure;sedentary lifestyle     Objective:    Today's Vitals   11/26/22 1639  Weight: 180 lb (81.6 kg)  Height: 5\' 4"  (1.626 m)   Body mass index is 30.9 kg/m.     11/26/2022    4:46 PM 10/28/2022   11:28 AM 01/26/2022    2:28 PM 01/25/2020    9:12 AM 12/10/2019    1:56 PM 06/15/2019   10:37 AM 04/03/2018    9:09 AM  Advanced Directives  Does Patient Have a Medical Advance Directive? No No No No No No No  Would patient like information on creating a medical advance directive? Yes (MAU/Ambulatory/Procedural Areas - Information given) No - Patient declined  No - Patient declined No - Patient declined No - Patient declined No - Patient declined    Current Medications (verified) Outpatient Encounter Medications as of 11/26/2022  Medication Sig   albuterol (VENTOLIN HFA) 108 (90 Base) MCG/ACT inhaler Inhale 1-2 puffs into the lungs every 6 (six) hours as needed for wheezing or shortness of breath. INHALE ONE TO TWO PUFFS BY MOUTH EVERY 6 HOURS AS NEEDED FOR WHEEZING AND FOR SHORTNESS OF BREATH (Patient not taking: Reported on 11/26/2022)   sodium chloride (OCEAN) 0.65 % SOLN nasal spray Place 1 spray into both nostrils as needed for congestion. (Patient not taking: Reported on 11/26/2022)   Spacer/Aero-Holding Rudean Curt Use  with inhaler as instructed (Patient not taking: Reported on 11/26/2022)   No facility-administered encounter medications on file as of 11/26/2022.    Allergies (verified) Patient has no known allergies.   History: Past Medical History:  Diagnosis Date   Asthma    exacerbations w/ respiratory infxns. Well controlled w/ abuterol PRN   Condyloma acuminatum    COPD (chronic obstructive pulmonary disease) (HCC)    Major depression    Unable to take antidepressents due to intolerance. Prefers to handle w/o Rx   Past Surgical History:  Procedure Laterality Date   HEMORRHOID SURGERY  2020   unsure of exact year   TUBAL LIGATION     Family History  Problem Relation Age of Onset   Cancer Mother        breast cancer at 74yo   Heart attack Mother        late 62s    COPD Mother    Dementia Father    Breast cancer Maternal Grandmother        around 1yo   Breast cancer Paternal Grandmother        around 27yo   Social History   Socioeconomic History   Marital status: Significant Other    Spouse name: Not on file   Number of children: 3   Years of education: 42   Highest education level: 11th grade  Occupational History   Occupation: disabled  Tobacco Use   Smoking status: Every Day  Current packs/day: 1.00    Types: Cigarettes   Smokeless tobacco: Never   Tobacco comments:    Started smoking at age 10  Vaping Use   Vaping status: Never Used  Substance and Sexual Activity   Alcohol use: Yes    Alcohol/week: 4.0 standard drinks of alcohol    Types: 4 Cans of beer per week    Comment: weekly weekends per pt, hard lemonade   Drug use: Yes    Types: Marijuana    Comment: pt states daily use   Sexual activity: Not Currently    Birth control/protection: Surgical  Other Topics Concern   Not on file  Social History Narrative   Lives in house with significant other. One level house, no trouble with stairs, has smoke alarms. No throw rugs on floor.   Has a cat, dog, bird,  and fish.      Eat meat, vegetables, does not eat fruit. Likes to drink milk.   No real hobbies but likes to play pool. Also enjoys cooking.      Always wears seat belt.      Social Determinants of Health   Financial Resource Strain: Low Risk  (11/26/2022)   Overall Financial Resource Strain (CARDIA)    Difficulty of Paying Living Expenses: Not hard at all  Food Insecurity: No Food Insecurity (11/26/2022)   Hunger Vital Sign    Worried About Running Out of Food in the Last Year: Never true    Ran Out of Food in the Last Year: Never true  Transportation Needs: No Transportation Needs (11/26/2022)   PRAPARE - Administrator, Civil Service (Medical): No    Lack of Transportation (Non-Medical): No  Physical Activity: Inactive (11/26/2022)   Exercise Vital Sign    Days of Exercise per Week: 0 days    Minutes of Exercise per Session: 0 min  Stress: No Stress Concern Present (11/26/2022)   [employer] of Occupational Health - Occupational Stress Questionnaire    Feeling of Stress : Not at all  Social Connections: Moderately Isolated (11/26/2022)   Social Connection and Isolation Panel [NHANES]    Frequency of Communication with Friends and Family: Three times a week    Frequency of Social Gatherings with Friends and Family: Once a week    Attends Religious Services: Never    Database administrator or Organizations: No    Attends Engineer, structural: Never    Marital Status: Living with partner    Tobacco Counseling Ready to quit: Not Answered Counseling given: Not Answered Tobacco comments: Started smoking at age 59   Clinical Intake:  Pre-visit preparation completed: Yes  Pain : No/denies pain     Diabetes: No  How often do you need to have someone help you when you read instructions, pamphlets, or other written materials from your doctor or pharmacy?: 1 - Never  Interpreter Needed?: No  Information entered by :: Kandis Fantasia LPN   Activities of  Daily Living    11/26/2022    4:45 PM 12/30/2021    9:26 AM  In your present state of health, do you have any difficulty performing the following activities:  Hearing? 0 0  Vision? 0 0  Difficulty concentrating or making decisions? 0 0  Walking or climbing stairs? 0 0  Dressing or bathing? 0 0  Doing errands, shopping? 0 0  Preparing Food and eating ? N N  Using the Toilet? N N  In the past six months,  have you accidently leaked urine? N Y  Comment  with coughing, has had physical therapy before  Do you have problems with loss of bowel control? N N  Managing your Medications? N N  Managing your Finances? N N  Housekeeping or managing your Housekeeping? N Y  Comment  due to distractability    Patient Care Team: Celine Mans, MD as PCP - General (Family Medicine) Wyline Mood Alben Spittle, MD as PCP - Cardiology (Cardiology) Mateo Flow, MD as Consulting Physician (Ophthalmology) Kerin Salen, MD as Consulting Physician (Gastroenterology) Karie Soda, MD as Consulting Physician (General Surgery)  Indicate any recent Medical Services you may have received from other than [facility] providers in the past year (date may be approximate).     Assessment:   This is a routine wellness examination for [facility].  Hearing/Vision screen Hearing Screening - Comments:: Denies hearing difficulties   Vision Screening - Comments:: No vision problems; will schedule routine eye exam soon    Dietary issues and exercise activities discussed:     Goals Addressed             This Visit's Progress    COMPLETED: Patient Stated       " I don't have any goals."      Remain active and independent        Depression Screen    11/26/2022    4:43 PM 10/28/2022   11:28 AM 01/26/2022    2:28 PM 12/30/2021    9:25 AM 01/25/2020    9:12 AM 12/10/2019    1:57 PM 06/15/2019   10:38 AM  PHQ 2/9 Scores  PHQ - 2 Score 2 2 1 1 2 4 2   PHQ- 9 Score 4 4 3  6 10 7     Fall Risk    11/26/2022    4:45 PM  12/30/2021    9:24 AM 06/15/2019   10:38 AM 09/22/2018   10:10 AM 04/03/2018    9:10 AM  Fall Risk   Falls in the past year? 0 0 0 0 0  Number falls in past yr: 0  0 0   Injury with Fall? 0  0    Risk for fall due to : No Fall Risks      Follow up Falls prevention discussed;Education provided;Falls evaluation completed   Falls evaluation completed     MEDICARE RISK AT HOME:  Medicare Risk at Home - 11/26/22 1645     Any stairs in or around the home? No    If so, are there any without handrails? No    Home free of loose throw rugs in walkways, pet beds, electrical cords, etc? Yes    Adequate lighting in your home to reduce risk of falls? Yes    Life alert? No    Use of a cane, walker or w/c? No    Grab bars in the bathroom? No    Shower chair or bench in shower? No    Elevated toilet seat or a handicapped toilet? No             TIMED UP AND GO:  Was the test performed?  No    Cognitive Function:    04/03/2018    9:13 AM  MMSE - Mini Mental State Exam  Orientation to time 5  Orientation to Place 5  Registration 3  Attention/ Calculation 5  Recall 3  Language- name 2 objects 2  Language- repeat 1  Language- follow 3 step command 3  Language-  read & follow direction 1  Write a sentence 1  Copy design 1  Total score 30        11/26/2022    4:45 PM 12/30/2021    9:32 AM 04/03/2018    9:13 AM  6CIT Screen  What Year? 0 points 0 points 0 points  What month? 0 points 0 points 0 points  What time? 0 points 0 points 0 points  Count back from 20 0 points 0 points 0 points  Months in reverse 2 points 2 points 0 points  Repeat phrase 0 points 2 points 0 points  Total Score 2 points 4 points 0 points    Immunizations Immunization History  Administered Date(s) Administered   Influenza Whole 01/11/2008   Influenza,inj,Quad PF,6+ Mos 06/19/2013, 01/07/2015, 04/14/2016, 02/06/2018, 01/26/2022   Influenza-Unspecified 01/05/2020   PFIZER(Purple Top)SARS-COV-2  Vaccination 07/18/2019, 08/09/2019, 04/03/2020   PNEUMOCOCCAL CONJUGATE-20 11/20/2020   Td 05/21/1999   Tdap 06/16/2017    TDAP status: Up to date  Flu Vaccine status: Due, Education has been provided regarding the importance of this vaccine. Advised may receive this vaccine at local pharmacy or Health Dept. Aware to provide a copy of the vaccination record if obtained from local pharmacy or Health Dept. Verbalized acceptance and understanding.  Pneumococcal vaccine status: Up to date  Covid-19 vaccine status: Information provided on how to obtain vaccines.   Qualifies for Shingles Vaccine? No     Screening Tests Health Maintenance  Topic Date Due   PAP SMEAR-Modifier  04/28/2019   COVID-19 Vaccine (4 - 2023-24 season) 12/18/2021   MAMMOGRAM  11/20/2022   INFLUENZA VACCINE  11/18/2022   Colonoscopy  11/26/2023 (Originally 03/21/2018)   Medicare Annual Wellness (AWV)  11/26/2023   DTaP/Tdap/Td (3 - Td or Tdap) 06/17/2027   Hepatitis C Screening  Completed   HIV Screening  Completed   HPV VACCINES  Aged Out    Health Maintenance  Health Maintenance Due  Topic Date Due   PAP SMEAR-Modifier  04/28/2019   COVID-19 Vaccine (4 - 2023-24 season) 12/18/2021   MAMMOGRAM  11/20/2022   INFLUENZA VACCINE  11/18/2022    Colorectal cancer screening:  Patient declines   Mammogram status: Ordered today. Pt provided with contact info and advised to call to schedule appt.   Lung Cancer Screening: (Low Dose CT Chest recommended if Age 52-80 years, 20 pack-year currently smoking OR have quit w/in 15years.) does not qualify.   Lung Cancer Screening Referral: n/a  Additional Screening:  Hepatitis C Screening: does qualify; Completed 12/30/21  Vision Screening: Recommended annual ophthalmology exams for early detection of glaucoma and other disorders of the eye. Is the patient up to date with their annual eye exam?  No  Who is the provider or what is the name of the office in which the  patient attends annual eye exams? none If pt is not established with a provider, would they like to be referred to a provider to establish care? No .   Dental Screening: Recommended annual dental exams for proper oral hygiene  Community Resource Referral / Chronic Care Management: CRR required this visit?  No   CCM required this visit?  No     Plan:     I have personally reviewed and noted the following in the patient's chart:   Medical and social history Use of alcohol, tobacco or illicit drugs  Current medications and supplements including opioid prescriptions. Patient is not currently taking opioid prescriptions. Functional ability and status Nutritional status  Physical activity Advanced directives List of other physicians Hospitalizations, surgeries, and ER visits in previous 12 months Vitals Screenings to include cognitive, depression, and falls Referrals and appointments  In addition, I have reviewed and discussed with patient certain preventive protocols, quality metrics, and best practice recommendations. A written personalized care plan for preventive services as well as general preventive health recommendations were provided to patient.     Kandis Fantasia [place], [place]   11/17/1910   After Visit Summary: (MyChart) Due to this being a telephonic visit, the after visit summary with patients personalized plan was offered to patient via MyChart   Nurse Notes: No concerns at this time

## 2022-12-09 ENCOUNTER — Ambulatory Visit (HOSPITAL_COMMUNITY): Payer: Medicare Other | Admitting: Licensed Clinical Social Worker

## 2022-12-13 ENCOUNTER — Ambulatory Visit
Admission: RE | Admit: 2022-12-13 | Discharge: 2022-12-13 | Disposition: A | Discharge status: Home / Self Care | Payer: Medicare Other | Source: Ambulatory Visit | Attending: Family Medicine | Admitting: Family Medicine

## 2022-12-13 DIAGNOSIS — Z1231 Encounter for screening mammogram for malignant neoplasm of breast: Secondary | ICD-10-CM

## 2022-12-23 ENCOUNTER — Ambulatory Visit (INDEPENDENT_AMBULATORY_CARE_PROVIDER_SITE_OTHER): Payer: Medicare Other | Admitting: Licensed Clinical Social Worker

## 2022-12-23 DIAGNOSIS — F411 Generalized anxiety disorder: Secondary | ICD-10-CM | POA: Diagnosis not present

## 2022-12-23 DIAGNOSIS — F331 Major depressive disorder, recurrent, moderate: Secondary | ICD-10-CM | POA: Diagnosis not present

## 2022-12-23 NOTE — Progress Notes (Signed)
Virtual Visit via Video Note  I connected with Diane Pacheco on 12/23/22 at  9:00 AM EDT by a video enabled telemedicine application and verified that I am speaking with the correct person using two identifiers.  Location: Patient: home Provider: home office   I discussed the limitations of evaluation and management by telemedicine and the availability of in person appointments. The patient expressed understanding and agreed to proceed.   I discussed the assessment and treatment plan with the patient. The patient was provided an opportunity to ask questions and all were answered. The patient agreed with the plan and demonstrated an understanding of the instructions.   The patient was advised to call back or seek an in-person evaluation if the symptoms worsen or if the condition fails to improve as anticipated.  I provided 40 minutes of non-face-to-face time during this encounter.  THERAPIST PROGRESS NOTE  Session Time: 9:00 AM to 9:40 AM  Participation Level: Active  Behavioral Response: CasualAlertAnxious  Type of Therapy: Individual Therapy  Treatment Goals addressed: stress management, supportive interventions to help patient be caretaker for family members, emotional regulation for anxiety, anger, depression, strategies to help her in caretaking that will help with stress  ProgressTowards Goals: Progressing-patient utilizing session for stress management as there is increase in stress due to moving  Interventions: Solution Focused, Strength-based, Anger Management Training, and Other: coping  Summary: Diane Pacheco is a 52 y.o. female who presents with has to get out on Saturday from place she is renting, going to the beach on Sunday. Stressing about that. Didn't get it done between remodeling and getting things in to new place, hard to get stuffed packed up also having to do grocery shopping and laundry. Decided not going to take cat take it to [name] [place] the  country. Ray Larita Fife asked to be step mom to feed and love on her. Doing kitchen millions pots and pans. Getting rid of things hard on Bear not as hard on patient few things on Mom kept. Patient is at a point I don't care.  Therapist noted that can be a helpful point when dealing with these kind of things. Not going to get rid of pots and pans. Therapist noted a very cool activity to be into cooking a great past time does a lot of positive things. Not keeping a lot from Mom but a few things has two a matching set boy and girl bust of them boy playing saxophone and little girl holding her ears. They were made at [place] they were grandparents and keeping. Also their [place] platter. Not excited but come to terms won't to be in country will be in city. Therapist noted the positive it is hers so can do what she wants. Patient agrees. Therapist said glad to see her go on vacation after all this moving patient says will probably crash. The moving is overwhelming. Therapist asked what doing today moving stuff today.  As she was in the kitchen talked about her cooking. Uses pressure cooker for canning own three of them. Showed therapist her green beans and potatoes seals the can. Also can tomatoes, cans dillybeans pickled beans with spice. Talking her sheep with her outdoor decoration.  Noted [name] they are to help move so patient wanted to end session so she could continue her moving.     Therapist utilized session to process thoughts and feelings patient stressed at the end of moving still a lot to do and  feeling overwhelmed.  Assess helpful to externalize emotions and feelings as she is dealing with increase and stressors.  Talked about moving experiences being stressful for lots of people therapist included, focused on the positive moving into her place that her so she can fix it up not just running.  As she was moving her kitchen things talked about cooking is such a positive hobby and many ways  creative, the creative process as being helpful for wellbeing also improve his quality of life a giving experience to others so therapist impressed enthusiastic interesting that she has lots of different gadgets because she is very much into it therapist could relate to that as well.  Talked about her approach to moving she is able to get rid of things therapist noted this can be cleansing and positive.  Noted another positive if she is good to be on medication after moving therapist noted perfect timing. Suicidal/Homicidal: No  Plan: Return again in 2 weeks.2.Marland KitchenTherapist work with patient on stress management, grief, coping  Diagnosis: Major Depressive Disorder, recurrent, moderate (history of bipolar) Generalized Anxiety Disorder  Collaboration of Care: Other none needed  Patient/Guardian was advised Release of Information must be obtained prior to any record release in order to collaborate their care with an outside provider. Patient/Guardian was advised if they have not already done so to contact the registration department to sign all necessary forms in order for [place] to release information regarding their care.   Consent: Patient/Guardian gives verbal consent for treatment and assignment of benefits for services provided during this visit. Patient/Guardian expressed understanding and agreed to proceed.   Coolidge Breeze, LCSW 12/23/2022

## 2023-01-06 ENCOUNTER — Ambulatory Visit (INDEPENDENT_AMBULATORY_CARE_PROVIDER_SITE_OTHER): Payer: Medicare Other | Admitting: Licensed Clinical Social Worker

## 2023-01-06 DIAGNOSIS — F411 Generalized anxiety disorder: Secondary | ICD-10-CM

## 2023-01-06 DIAGNOSIS — F331 Major depressive disorder, recurrent, moderate: Secondary | ICD-10-CM | POA: Diagnosis not present

## 2023-01-06 NOTE — Progress Notes (Signed)
Virtual Visit via Video Note  I connected with Diane Pacheco on 01/06/23 at  8:00 AM EDT by a video enabled telemedicine application and verified that I am speaking with the correct person using two identifiers.  Location: Patient: outside Provider: home office   I discussed the limitations of evaluation and management by telemedicine and the availability of in person appointments. The patient expressed understanding and agreed to proceed.   I discussed the assessment and treatment plan with the patient. The patient was provided an opportunity to ask questions and all were answered. The patient agreed with the plan and demonstrated an understanding of the instructions.   The patient was advised to call back or seek an in-person evaluation if the symptoms worsen or if the condition fails to improve as anticipated.  I provided 45 minutes of non-face-to-face time during this encounter.  THERAPIST PROGRESS NOTE  Session Time: 8:00 AM to 8:45 AM  Participation Level: Active  Behavioral Response: CasualAlertEuthymic  Type of Therapy: Individual Therapy  Treatment Goals addressed:  stress management, supportive interventions to help patient be caretaker for family members, emotional regulation for anxiety, anger, depression, strategies to help her in caretaking that will help with stress  ProgressTowards Goals: Progressing-assess helpful for patient to have an outlet to talk about thoughts and feelings with someone other than family gets the benefit of helpful and therapeutic interventions  Interventions: Solution Focused, Strength-based, Supportive, and Other: coping  Summary: Diane Pacheco is a 52 y.o. female who presents with at [name] getting things straight at their house it is a jumble of boxes. Get the cat acclimated been trapped indoors for a few days before let her out she has to get the smell of the house. Yesterday got the bedroom set up at their house, pool  table in place speakers and neo lights under it so can walk until he decided where to hang them. Trying to get kitchen, bathroom and bedroom to move in. Move in the next week or two. Back from vacation it was lovely. Therapist noted perfect timing saw her little community. Doesn't bother her at [facility] just a little crowded they have their own room. Glad the house is smaller lot less to clean just replaced the thermostat on refrigerator hope that it fixed only other thing is control panel on top of fridge. New place leaks a little [name] will fix. Manage to get bedroom in backroom.  Therapist at last patient if struggling with anything she says things are fine she says just talk to somebody else besides family. Hard to complain about them to them. [name] is a little bit OCD why not in their house. For the weekend more work the way it is going it will be in next two months. [name] has to figure where he wants his pictures and speakers. Patient doesn't care work on [employer] everywhere so can move. Explored what was good this week the cat went in the litter box. Enjoyed sitting mom's back porch watching humming birds. Out in the country changed her name to [place] instead of Spot rules the bedroom like the queen of [place].   Noted for patient helpful to have an outlet to talk about thoughts and feelings as she said somebody besides family if there are issues cannot really talk to family at if it is about them.  Noted patient's attitude is positive for signs of humor getting her cat acclimated looking at the positive the situations such as what  happened good this week she says the kitty went in the litter box.  Enjoying the country watching the hummingbird's in the morning.  Reviewed what she is struggling with she is doing okay just work to do to move into the new house.  Therapist can see positives in this move will have to see how it unfolds with therapist see the positive that it is their place so they can fix it  up patient glad that it is smaller.  Assessed topics help with mood talk about different things we both enjoy that actually create positive in her life's such as certain shows, humorous incidents during the week.  Therapist provided active listening open questions supportive interventions. Suicidal/Homicidal: No  Plan: Return again in 2 weeks.2.Therapist work with patient on stress management, grief, coping  Diagnosis: Major Depressive Disorder, recurrent, moderate (history of bipolar) Generalized Anxiety Disorder  Collaboration of Care: Other none needed  Patient/Guardian was advised Release of Information must be obtained prior to any record release in order to collaborate their care with an outside provider. Patient/Guardian was advised if they have not already done so to contact the registration department to sign all necessary forms in order for [place] to release information regarding their care.   Consent: Patient/Guardian gives verbal consent for treatment and assignment of benefits for services provided during this visit. Patient/Guardian expressed understanding and agreed to proceed.   Coolidge Breeze, LCSW 01/06/2023

## 2023-01-20 ENCOUNTER — Ambulatory Visit (HOSPITAL_COMMUNITY): Payer: Medicare Other | Admitting: Licensed Clinical Social Worker

## 2023-01-20 DIAGNOSIS — F411 Generalized anxiety disorder: Secondary | ICD-10-CM

## 2023-01-20 DIAGNOSIS — F331 Major depressive disorder, recurrent, moderate: Secondary | ICD-10-CM | POA: Diagnosis not present

## 2023-01-20 NOTE — Progress Notes (Signed)
Virtual Visit via Video Note  I connected with Diane Pacheco on 01/20/23 at  9:00 AM EDT by a video enabled telemedicine application and verified that I am speaking with the correct person using two identifiers.  Location: Patient: at [employer] house Provider: home office   I discussed the limitations of evaluation and management by telemedicine and the availability of in person appointments. The patient expressed understanding and agreed to proceed.   I discussed the assessment and treatment plan with the patient. The patient was provided an opportunity to ask questions and all were answered. The patient agreed with the plan and demonstrated an understanding of the instructions.   The patient was advised to call back or seek an in-person evaluation if the symptoms worsen or if the condition fails to improve as anticipated.  I provided 45 minutes of non-face-to-face time during this encounter.  THERAPIST PROGRESS NOTE  Session Time: 9:00 AM to 9:45 AM  Participation Level: Active  Behavioral Response: CasualAlertappropriate  Type of Therapy: Individual Therapy  Treatment Goals addressed:  stress management, supportive interventions to help patient be caretaker for family members, emotional regulation for anxiety, anger, depression, strategies to help her in caretaking that will help with stress  ProgressTowards Goals: Progressing-says patient utilizes therapy as an outlet to help with coping helps her remain stable with mental health symptoms  Interventions: Solution Focused, Strength-based, Supportive, and Other: Coping   Summary: Diane Pacheco is a 52 y.o. female who presents with has been sick for a week sinus, throat, chest, coughing bad cold. Tired. Still at [facility] he needs to be able to move around at their house before they move int. Kitchen is done and bedroom is done. Went over yesterday first time in a few days, not going over today [name] is sick. Cold  irritated his breathing and throat thinks the thing in throat is coming back.patient thinks more likely cold perhaps with his anxiety irritating it. CT in 18th. Yesterday laid on the bed watched television. They are putting side walk in front of house. Most houses built close to the road now putting in sidewalks moved fence back, cut down a tree. Hardly have any front yard. They have a storage building watching what they do with mental ramp. Talked to person about it so they know to be careful to a certain extent part of their plans.  Talked about different amusing shows that both patient and therapist as things that helps [place] to enjoy life. Other topics that she had a folder of "Book of Knowledge" would watch a show about different topics and write notes. 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 That was in late 20's.  Therapist thought that was great and related actually when relearning were releasing positive chemicals is actually mood booster      Therapist reviewed symptoms facilitated expression of thoughts and feelings noted ongoing stressor of moving helpful for patient to have an outlet to process her thoughts and feelings to help with coping.  Noted not feeling well therapist thinks may be related to how much she and [name] been working to fix their house encouraged her to relax which is her plan.  Session included different subjects assess that is positive for patient as well how the place she can talk about different things with somebody besides her normal supports.  Noted sense of humor is helpful noted interest in shows positive outlet as well.  Therapist mentions included talking about realistic coping skills, directed questions empathetic listening  and problem solving Suicidal/Homicidal: No  Plan: Return again in 2 weeks.2.Therapist work with patient on stress management, grief, coping  Diagnosis:  stress management, supportive interventions to help patient be caretaker for family members, emotional  regulation for anxiety, anger, depression, strategies to help her in caretaking that will help with stress  Collaboration of Care: Other none needed  Patient/Guardian was advised Release of Information must be obtained prior to any record release in order to collaborate their care with an outside provider. Patient/Guardian was advised if they have not already done so to contact the registration department to sign all necessary forms in order for [place] to release information regarding their care.   Consent: Patient/Guardian gives verbal consent for treatment and assignment of benefits for services provided during this visit. Patient/Guardian expressed understanding and agreed to proceed.   Coolidge Breeze, LCSW 01/20/2023

## 2023-01-25 ENCOUNTER — Ambulatory Visit: Payer: Medicare Other | Admitting: Student

## 2023-01-25 ENCOUNTER — Encounter: Payer: Self-pay | Admitting: Student

## 2023-01-25 VITALS — BP 120/70 | HR 74 | Wt 178.0 lb

## 2023-01-25 DIAGNOSIS — J0100 Acute maxillary sinusitis, unspecified: Secondary | ICD-10-CM | POA: Diagnosis not present

## 2023-01-25 DIAGNOSIS — J449 Chronic obstructive pulmonary disease, unspecified: Secondary | ICD-10-CM | POA: Diagnosis not present

## 2023-01-25 MED ORDER — FLUTICASONE PROPIONATE 50 MCG/ACT NA SUSP: 2.0000 | Freq: Every day | NASAL | 6 refills | Status: DC

## 2023-01-25 MED ORDER — PREDNISONE 20 MG PO TABS: 40.0000 mg | ORAL_TABLET | Freq: Every day | ORAL | 0 refills | Status: AC

## 2023-01-25 MED ORDER — AMOXICILLIN-POT CLAVULANATE 500-125 MG PO TABS: 1.0000 | ORAL_TABLET | Freq: Two times a day (BID) | ORAL | 0 refills | Status: AC

## 2023-01-25 NOTE — Patient Instructions (Signed)
It was great to see you! Thank you for allowing me to participate in your care!   Our plans for today:  -I believe that you most likely have Sinusitis with a possible component of COPD exacerbation-I am going to prescribe you a course of Augmentin to take for 5 days as well as prednisone to take for 5 days as well -If not improving or worsening please return to care.  If having fevers, shortness of breath, chest pain or coughing up blood these are all reasons to go to the emergency department or call us.  Take care and seek immediate care sooner if you develop any concerns.  Levin Erp, MD

## 2023-01-25 NOTE — Progress Notes (Signed)
    SUBJECTIVE:   CHIEF COMPLAINT / HPI: Sick Symptoms  Past two weeks has been having thick yellow mucus coming from nose Coughing a lot at night and yellow stuff coming up when coughing. No hemoptysis. 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 and would like to feel better Has been trying mucinex, tussin with no improvement Smoking 0.5 PPD Albuterol-not helpful No fevers No ear pain Not harder to breathe or any dyspnea Doesn't feel like COPD/bronchitis flare to her, she states no wheezing or congestion and albuterol not helping much  PERTINENT  PMH / PSH: COPD, Tobacco use  OBJECTIVE:   BP 120/70   Pulse 74   Wt 178 lb (80.7 kg)   LMP 12/21/2020   SpO2 98%   BMI 30.55 kg/m   General: NAD, awake, alert, responsive to questions Head: Normocephalic atraumatic, tenderness to palpation along maxillary sinus, turbinates swollen bilaterally, no oral exudates however erythematous oropharynx, mildly tender cervical lymphadenopathy bilaterally CV: Regular rate and rhythm no murmurs rubs or gallops Respiratory: Clear to ausculation bilaterally, no wheezes rales or crackles, chest rises symmetrically,  no increased work of breathing Extremities: Moves upper and lower extremities freely, no edema in LE  ASSESSMENT/PLAN:   Chronic obstructive pulmonary disease (HCC) Does have productive cough for the past 2 weeks.  No wheezing on examination.  She is not adherent to her inhalers.  Will also supplement with steroids in the meantime.  If not improving recommended returning to care.  No fevers or focal consolidation on examination, less likely to be pneumonia but consider obtaining CXR if patient not improving -Prednisone 40 mg for 5 days -PCP follow-up for inhaler discussion -Encourage smoking cessation   Acute non-recurrent maxillary sinusitis Patient has facial  tenderness on examination alongside productive thick yellow sputum from nose.  Will treat as a sinusitis given no improvement after 2 weeks. - amoxicillin-clavulanate (AUGMENTIN) 500-125 MG tablet; Take 1 tablet by mouth 2 (two) times daily for 5 days.  Dispense: 10 tablet; Refill: 0 - fluticasone (FLONASE) 50 MCG/ACT nasal spray; Place 2 sprays into both nostrils daily.  Dispense: 16 g; Refill: 6  Levin Erp, MD [facility]

## 2023-01-25 NOTE — Assessment & Plan Note (Addendum)
Does have productive cough for the past 2 weeks.  No wheezing on examination.  She is not adherent to her inhalers.  Will also supplement with steroids in the meantime.  If not improving recommended returning to care.  No fevers or focal consolidation on examination, less likely to be pneumonia but consider obtaining CXR if patient not improving -Prednisone 40 mg for 5 days -PCP follow-up for inhaler discussion -Encourage smoking cessation

## 2023-01-31 ENCOUNTER — Emergency Department (HOSPITAL_COMMUNITY): Payer: Medicare Other

## 2023-01-31 ENCOUNTER — Emergency Department (HOSPITAL_COMMUNITY)
Admission: EM | Admit: 2023-01-31 | Discharge: 2023-01-31 | Disposition: A | Discharge status: Home / Self Care | Payer: Medicare Other | Attending: Emergency Medicine | Admitting: Emergency Medicine

## 2023-01-31 ENCOUNTER — Other Ambulatory Visit: Payer: Self-pay

## 2023-01-31 ENCOUNTER — Encounter (HOSPITAL_COMMUNITY): Payer: Self-pay

## 2023-01-31 DIAGNOSIS — M87052 Idiopathic aseptic necrosis of left femur: Secondary | ICD-10-CM | POA: Diagnosis not present

## 2023-01-31 DIAGNOSIS — M87059 Idiopathic aseptic necrosis of unspecified femur: Secondary | ICD-10-CM

## 2023-01-31 DIAGNOSIS — M87051 Idiopathic aseptic necrosis of right femur: Secondary | ICD-10-CM | POA: Diagnosis not present

## 2023-01-31 DIAGNOSIS — J449 Chronic obstructive pulmonary disease, unspecified: Secondary | ICD-10-CM | POA: Diagnosis not present

## 2023-01-31 DIAGNOSIS — K5730 Diverticulosis of large intestine without perforation or abscess without bleeding: Secondary | ICD-10-CM | POA: Diagnosis not present

## 2023-01-31 DIAGNOSIS — M25552 Pain in left hip: Secondary | ICD-10-CM | POA: Insufficient documentation

## 2023-01-31 DIAGNOSIS — M47816 Spondylosis without myelopathy or radiculopathy, lumbar region: Secondary | ICD-10-CM | POA: Diagnosis not present

## 2023-01-31 DIAGNOSIS — M5430 Sciatica, unspecified side: Secondary | ICD-10-CM | POA: Diagnosis not present

## 2023-01-31 DIAGNOSIS — M129 Arthropathy, unspecified: Secondary | ICD-10-CM | POA: Diagnosis not present

## 2023-01-31 DIAGNOSIS — M4726 Other spondylosis with radiculopathy, lumbar region: Secondary | ICD-10-CM | POA: Diagnosis not present

## 2023-01-31 LAB — COMPREHENSIVE METABOLIC PANEL
ALT: 47 U/L — ABNORMAL HIGH (ref 0–44)
AST: 39 U/L (ref 15–41)
Albumin: 3.7 g/dL (ref 3.5–5.0)
Alkaline Phosphatase: 62 U/L (ref 38–126)
Anion gap: 10 (ref 5–15)
BUN: 14 mg/dL (ref 6–20)
CO2: 26 mmol/L (ref 22–32)
Calcium: 8.6 mg/dL — ABNORMAL LOW (ref 8.9–10.3)
Chloride: 96 mmol/L — ABNORMAL LOW (ref 98–111)
Creatinine, Ser: 0.76 mg/dL (ref 0.44–1.00)
GFR, Estimated: >60 mL/min (ref >60)
Glucose, Bld: 149 mg/dL — ABNORMAL HIGH (ref 70–99)
Potassium: 4.9 mmol/L (ref 3.5–5.1)
Sodium: 132 mmol/L — ABNORMAL LOW (ref 135–145)
Total Bilirubin: 0.4 mg/dL (ref 0.3–1.2)
Total Protein: 6.7 g/dL (ref 6.5–8.1)

## 2023-01-31 LAB — SEDIMENTATION RATE: Sed Rate: 3 mm/h (ref 0–22)

## 2023-01-31 LAB — CBC WITH DIFFERENTIAL/PLATELET
Abs Immature Granulocytes: 0.15 10*3/uL — ABNORMAL HIGH (ref 0.00–0.07)
Basophils Absolute: 0 10*3/uL (ref 0.0–0.1)
Basophils Relative: 0 %
Eosinophils Absolute: 0.1 10*3/uL (ref 0.0–0.5)
Eosinophils Relative: 1 %
HCT: 39.4 % (ref 36.0–46.0)
Hemoglobin: 13.3 g/dL (ref 12.0–15.0)
Immature Granulocytes: 2 %
Lymphocytes Relative: 13 %
Lymphs Abs: 1.3 10*3/uL (ref 0.7–4.0)
MCH: 37.5 pg — ABNORMAL HIGH (ref 26.0–34.0)
MCHC: 33.8 g/dL (ref 30.0–36.0)
MCV: 111 fL — ABNORMAL HIGH (ref 80.0–100.0)
Monocytes Absolute: 0.2 10*3/uL (ref 0.1–1.0)
Monocytes Relative: 3 %
Neutro Abs: 7.6 10*3/uL (ref 1.7–7.7)
Neutrophils Relative %: 81 %
Platelets: 202 10*3/uL (ref 150–400)
RBC: 3.55 MIL/uL — ABNORMAL LOW (ref 3.87–5.11)
RDW: 12.4 % (ref 11.5–15.5)
WBC: 9.3 10*3/uL (ref 4.0–10.5)
nRBC: 0 % (ref 0.0–0.2)

## 2023-01-31 LAB — C-REACTIVE PROTEIN: CRP: 1.2 mg/dL — ABNORMAL HIGH (ref <1.0)

## 2023-01-31 MED ORDER — GADOBUTROL 1 MMOL/ML IV SOLN
10.0000 mL | Freq: Once | INTRAVENOUS | Status: AC | PRN
  Administered 2023-01-31: 10 mL via INTRAVENOUS

## 2023-01-31 MED ORDER — DEXAMETHASONE SODIUM PHOSPHATE 10 MG/ML IJ SOLN
10.0000 mg | Freq: Once | INTRAMUSCULAR | Status: AC
  Administered 2023-01-31: 10 mg via INTRAMUSCULAR
  Filled 2023-01-31: qty 1

## 2023-01-31 MED ORDER — LIDOCAINE 5 % EX PTCH
1.0000 | MEDICATED_PATCH | Freq: Once | CUTANEOUS | Status: DC
  Administered 2023-01-31: 1 via TRANSDERMAL
  Filled 2023-01-31: qty 1

## 2023-01-31 MED ORDER — HYDROMORPHONE HCL 1 MG/ML IJ SOLN
1.0000 mg | Freq: Once | INTRAMUSCULAR | Status: AC
  Administered 2023-01-31: 1 mg via INTRAVENOUS
  Filled 2023-01-31: qty 1

## 2023-01-31 MED ORDER — GABAPENTIN 100 MG PO CAPS
100.0000 mg | ORAL_CAPSULE | Freq: Once | ORAL | Status: AC
  Administered 2023-01-31: 100 mg via ORAL
  Filled 2023-01-31: qty 1

## 2023-01-31 MED ORDER — CYCLOBENZAPRINE HCL 10 MG PO TABS
10.0000 mg | ORAL_TABLET | Freq: Once | ORAL | Status: AC
  Administered 2023-01-31: 10 mg via ORAL
  Filled 2023-01-31: qty 1

## 2023-01-31 MED ORDER — KETOROLAC TROMETHAMINE 60 MG/2ML IM SOLN
30.0000 mg | Freq: Once | INTRAMUSCULAR | Status: AC
  Administered 2023-01-31: 30 mg via INTRAMUSCULAR
  Filled 2023-01-31: qty 2

## 2023-01-31 MED ORDER — SODIUM CHLORIDE 0.9 % IV BOLUS
500.0000 mL | Freq: Once | INTRAVENOUS | Status: AC
  Administered 2023-01-31: 500 mL via INTRAVENOUS

## 2023-01-31 MED ORDER — ONDANSETRON HCL 4 MG/2ML IJ SOLN
4.0000 mg | Freq: Once | INTRAMUSCULAR | Status: AC
  Administered 2023-01-31: 4 mg via INTRAVENOUS
  Filled 2023-01-31: qty 2

## 2023-01-31 MED ORDER — DEXAMETHASONE SODIUM PHOSPHATE 10 MG/ML IJ SOLN: 10.0000 mg | Freq: Once | INTRAMUSCULAR | Status: DC

## 2023-01-31 MED ORDER — OXYCODONE-ACETAMINOPHEN 5-325 MG PO TABS
1.0000 | ORAL_TABLET | Freq: Once | ORAL | Status: AC
  Administered 2023-01-31: 1 via ORAL
  Filled 2023-01-31: qty 1

## 2023-01-31 MED ORDER — OXYCODONE-ACETAMINOPHEN 5-325 MG PO TABS
1.0000 | ORAL_TABLET | Freq: Three times a day (TID) | ORAL | 0 refills | Status: AC | PRN
Start: 2023-01-31 — End: 2023-02-03

## 2023-01-31 MED ORDER — IBUPROFEN 600 MG PO TABS: 600.0000 mg | ORAL_TABLET | Freq: Four times a day (QID) | ORAL | 0 refills | Status: DC | PRN

## 2023-01-31 MED ORDER — PREDNISONE 10 MG (21) PO TBPK: ORAL_TABLET | Freq: Every day | ORAL | 0 refills | Status: DC

## 2023-01-31 MED ORDER — GABAPENTIN 100 MG PO CAPS
100.0000 mg | ORAL_CAPSULE | Freq: Three times a day (TID) | ORAL | 0 refills | Status: DC
Start: 2023-01-31 — End: 2023-05-27

## 2023-01-31 MED ORDER — OXYCODONE HCL 5 MG PO TABS
5.0000 mg | ORAL_TABLET | Freq: Once | ORAL | Status: AC
  Administered 2023-01-31: 5 mg via ORAL
  Filled 2023-01-31: qty 1

## 2023-01-31 NOTE — ED Provider Notes (Signed)
[facility] [facility] Provider Note   CSN: [id number] Arrival date & time: 01/31/23  0146     History  Chief Complaint  Patient presents with   Hip Pain    Diane Pacheco is a 52 y.o. female.   Hip Pain  Patient presents for hip pain.  Medical history includes COPD, depression, HLD, obesity.  She denies any recent injuries.  2 nights ago, she went to sleep in her normal state of health.  She woke up in the middle of the night with a severe pain in anterior aspect of left hip radiating down posterior aspect of leg, all the way to her foot.  Pain is described as burning in nature.  She denies any history of similar symptoms in the past.  Pain has been persistent since onset.  She has taken ibuprofen with no relief.     Home Medications Prior to Admission medications   Medication Sig Start Date End Date Taking? Authorizing Provider  gabapentin (NEURONTIN) 100 MG capsule Take 1 capsule (100 mg total) by mouth 3 (three) times daily. 01/31/23 03/02/23 Yes Gloris Manchester, MD  oxyCODONE-acetaminophen (PERCOCET/ROXICET) 5-325 MG tablet Take 1 tablet by mouth every 8 (eight) hours as needed for up to 3 days for severe pain. 01/31/23 02/03/23 Yes Gloris Manchester, MD  predniSONE (STERAPRED UNI-PAK 21 TAB) 10 MG (21) TBPK tablet Take by mouth daily. Take 6 tabs by mouth daily  for 2 days, then 5 tabs for 2 days, then 4 tabs for 2 days, then 3 tabs for 2 days, 2 tabs for 2 days, then 1 tab by mouth daily for 2 days 01/31/23  Yes Gloris Manchester, MD  albuterol (VENTOLIN HFA) 108 (90 Base) MCG/ACT inhaler Inhale 1-2 puffs into the lungs every 6 (six) hours as needed for wheezing or shortness of breath. INHALE ONE TO TWO PUFFS BY MOUTH EVERY 6 HOURS AS NEEDED FOR WHEEZING AND FOR SHORTNESS OF BREATH Patient not taking: Reported on 11/26/2022 12/30/21   Littie Deeds, MD  fluticasone [facility]) 50 MCG/ACT nasal spray Place 2 sprays into both nostrils daily. 01/25/23   Levin Erp, MD  sodium chloride (OCEAN) 0.65 % SOLN nasal spray Place 1 spray into both nostrils as needed for congestion. Patient not taking: Reported on 11/26/2022 10/28/22   Tiffany Kocher, DO  Spacer/Aero-Holding Rudean Curt Use with inhaler as instructed Patient not taking: Reported on 11/26/2022 06/19/13   Ozella Rocks, MD      Allergies    Patient has no known allergies.    Review of Systems   Review of Systems  Musculoskeletal:  Positive for arthralgias.  All other systems reviewed and are negative.   Physical Exam Updated Vital Signs BP (!) 144/86 (BP Location: Left Arm)   Pulse 73   Temp 98.2 F (36.8 C) (Oral)   Resp 18   Ht 5\' 4"  (1.626 m)   Wt 80.7 kg   LMP 12/21/2020   SpO2 96%   BMI 30.54 kg/m  Physical Exam Vitals and nursing note reviewed.  Constitutional:      General: She is not in acute distress.    Appearance: Normal appearance. She is well-developed. She is not ill-appearing, toxic-appearing or diaphoretic.  HENT:     Head: Normocephalic and atraumatic.     Right Ear: External ear normal.     Left Ear: External ear normal.     Nose: Nose normal.     Mouth/Throat:     Mouth:  Mucous membranes are moist.  Eyes:     Extraocular Movements: Extraocular movements intact.     Conjunctiva/sclera: Conjunctivae normal.  Cardiovascular:     Rate and Rhythm: Normal rate and regular rhythm.     Heart sounds: No murmur heard. Pulmonary:     Effort: Pulmonary effort is normal. No respiratory distress.  Abdominal:     General: There is no distension.     Palpations: Abdomen is soft.  Musculoskeletal:        General: Tenderness present. No swelling or deformity. Normal range of motion.     Cervical back: Normal range of motion and neck supple.     Right lower leg: No edema.     Left lower leg: No edema.  Skin:    General: Skin is warm and dry.     Capillary Refill: Capillary refill takes less than 2 seconds.     Coloration: Skin is not jaundiced or pale.   Neurological:     General: No focal deficit present.     Mental Status: She is alert and oriented to person, place, and time.  Psychiatric:        Mood and Affect: Mood normal.        Behavior: Behavior normal.     ED Results / Procedures / Treatments   Labs (all labs ordered are listed, but only abnormal results are displayed) Labs Reviewed - No data to display  EKG None  Radiology DG Hip Unilat W or Wo Pelvis 2-3 Views Left  Result Date: 01/31/2023 CLINICAL DATA:  52 year old female with left hip pain. EXAM: DG HIP (WITH OR WITHOUT PELVIS) 2-3V LEFT COMPARISON:  None Available. FINDINGS: AP supine view of the pelvis, two views of the left hip at 0414 hours. Bone mineralization is within normal limits. Femoral heads normally located. Pelvis appears intact. SI joints and pubic symphysis appear within normal limits. Grossly intact proximal right femur. Proximal left femur appears intact and normal. Nonobstructed visible bowel gas pattern. IMPRESSION: Negative. Electronically Signed   By: Odessa Fleming M.D.   On: 01/31/2023 04:37    Procedures Procedures    Medications Ordered in ED Medications  oxyCODONE-acetaminophen (PERCOCET/ROXICET) 5-325 MG per tablet 1 tablet (has no administration in time range)  oxyCODONE-acetaminophen (PERCOCET/ROXICET) 5-325 MG per tablet 1 tablet (1 tablet Oral Given 01/31/23 0436)  gabapentin (NEURONTIN) capsule 100 mg (100 mg Oral Given 01/31/23 0436)  ketorolac (TORADOL) injection 30 mg (30 mg Intramuscular Given 01/31/23 0437)  dexamethasone (DECADRON) injection 10 mg (10 mg Intramuscular Given 01/31/23 0439)    ED Course/ Medical Decision Making/ A&P                                 Medical Decision Making Amount and/or Complexity of Data Reviewed Radiology: ordered.  Risk Prescription drug management.   Patient presents for 2 days of burning pain in left hip radiating down left lower extremity, all the way to her foot.  This is in the  absence of any recent injuries.  Vital signs on arrival are notable for moderate hypertension.  On exam, patient appears uncomfortable.  She has no numbness or weakness.  I do not appreciate any swelling to area of anterior hip.  Distal extremity is well-perfused.  DP and PT pulses are intact.  I suspect neuropathy.  X-ray of left hip was ordered.  Multimodal pain control was ordered.  On reassessment, patient had minimal relief.  Additional dose of Percocet was ordered.  X-ray imaging did not show acute findings.  CT scan was ordered to further evaluate.  At time of signout, results of CT were pending.  Care of patient was signed out to oncoming ED provider.        Final Clinical Impression(s) / ED Diagnoses Final diagnoses:  Left hip pain    Rx / DC Orders ED Discharge Orders          Ordered    gabapentin (NEURONTIN) 100 MG capsule  3 times daily        01/31/23 0617    predniSONE (STERAPRED UNI-PAK 21 TAB) 10 MG (21) TBPK tablet  Daily        01/31/23 0617    oxyCODONE-acetaminophen (PERCOCET/ROXICET) 5-325 MG tablet  Every 8 hours PRN        01/31/23 0617              Gloris Manchester, MD 01/31/23 [phone]

## 2023-01-31 NOTE — ED Triage Notes (Signed)
Pt arrived via POV c/o left hip pain X 2 days. Pt denies injury. Pt reports trying 600mg  Aleve twice a day w/o relief. Described pain as sharp, constant burning pain, worse when she lays down radiating to the bottom of her foot.

## 2023-01-31 NOTE — ED Notes (Signed)
Patient transported to CT 

## 2023-01-31 NOTE — ED Notes (Signed)
Per DO, Tanda Rockers, pt may have some water and ice chips--pt given water and ice chips

## 2023-01-31 NOTE — ED Notes (Signed)
Patient transported to X-ray 

## 2023-01-31 NOTE — ED Notes (Signed)
Patient transported to MRI 

## 2023-01-31 NOTE — Discharge Instructions (Addendum)
Prescriptions were sent to your pharmacy.   -Gabapentin treats neuropathic pain.  Take 3 times per day as prescribed.  Talk to primary care doctor about long-term use and/or increasing dosage -Prednisone is a steroid.  Take in tapering dose as prescribed. -Percocet is a narcotic pain medication.  Take only as needed.  Take with caution.  You can also take over-the-counter ibuprofen and Tylenol.   If you have persistent symptoms, there is a telephone number below that you can call for an orthopedic surgery follow-up.  Return to the emergency department for any new or worsening symptoms of concern.

## 2023-01-31 NOTE — ED Notes (Signed)
Pt states she does have a ride picking her up

## 2023-01-31 NOTE — ED Provider Notes (Signed)
Provider Note MRN:  [id number]  Arrival date & time: 01/31/23    ED Course and Medical Decision Making  Assumed care from [facility] at shift change.  See note from prior team for complete details, in brief:  52 yo f Hip pain x2 days Abnormal CT, ?brodie's abscess?   Plan per prior physician d/w ortho  Clinical Course as of 01/31/23 1553  Mon Jan 31, 2023  7829 Spoke with Dr Romeo Apple who reviewed images  [SG]  1503 MRI resulted, stable findings  [SG]    Clinical Course User Index [SG] Tanda Rockers A, DO   Imaging reviewed, stable.  No evidence of acute infectious process.  Notable avascular necrosis of bilateral hips.  Is feeling better  Pain improved, reasonable to trial outpatient management discomfort.  Follow-up orthopedics in the office.  May require hip replacement the future.  D/w Dr Romeo Apple, discharge reasonable    The patient improved significantly and was discharged in stable condition. Detailed discussions were had with the patient regarding current findings, and need for close f/u with PCP or on call doctor. The patient has been instructed to return immediately if the symptoms worsen in any way for re-evaluation. Patient verbalized understanding and is in agreement with current care plan. All questions answered prior to discharge.      Counseled patient for approximately 3 minutes regarding smoking cessation. Discussed risks of smoking and how they applied and affected their visit here today. Patient not ready to quit at this time, however will follow up with their primary doctor when they are.   CPT code: 56213: intermediate counseling for smoking cessation   .Critical Care  Performed by: Sloan Leiter, DO Authorized by: Sloan Leiter, DO   Critical care provider statement:    Critical care time (minutes):  30   Critical care time was exclusive of:  Separately billable procedures and treating other patients   Critical care was necessary to treat or  prevent imminent or life-threatening deterioration of the following conditions: severe pain.   Critical care was time spent personally by me on the following activities:  Development of treatment plan with patient or surrogate, discussions with consultants, evaluation of patient's response to treatment, examination of patient, ordering and review of laboratory studies, ordering and review of radiographic studies, ordering and performing treatments and interventions, pulse oximetry, re-evaluation of patient's condition, review of old charts and obtaining history from patient or surrogate   Final Clinical Impressions(s) / ED Diagnoses     ICD-10-CM   1. Left hip pain  M25.552     2. Avascular necrosis of bone of hip, unspecified laterality (HCC)  M87.059       ED Discharge Orders          Ordered    gabapentin (NEURONTIN) 100 MG capsule  3 times daily        01/31/23 0617    predniSONE (STERAPRED UNI-PAK 21 TAB) 10 MG (21) TBPK tablet  Daily        01/31/23 0617    oxyCODONE-acetaminophen (PERCOCET/ROXICET) 5-325 MG tablet  Every 8 hours PRN        01/31/23 0617    ibuprofen (ADVIL) 600 MG tablet  Every 6 hours PRN        01/31/23 1551              Discharge Instructions      Prescriptions were sent to your pharmacy.   -Gabapentin treats neuropathic pain.  Take 3 times per day  as prescribed.  Talk to primary care doctor about long-term use and/or increasing dosage -Prednisone is a steroid.  Take in tapering dose as prescribed. -Percocet is a narcotic pain medication.  Take only as needed.  Take with caution.  You can also take over-the-counter ibuprofen and Tylenol.   If you have persistent symptoms, there is a telephone number below that you can call for an orthopedic surgery follow-up.  Return to the emergency department for any new or worsening symptoms of concern.       Sloan Leiter, DO 01/31/23 1553

## 2023-02-03 ENCOUNTER — Ambulatory Visit (HOSPITAL_COMMUNITY): Payer: Medicare Other | Admitting: Licensed Clinical Social Worker

## 2023-02-03 ENCOUNTER — Telehealth (HOSPITAL_COMMUNITY): Payer: Self-pay | Admitting: Licensed Clinical Social Worker

## 2023-02-03 DIAGNOSIS — F411 Generalized anxiety disorder: Secondary | ICD-10-CM

## 2023-02-03 DIAGNOSIS — F331 Major depressive disorder, recurrent, moderate: Secondary | ICD-10-CM

## 2023-02-03 NOTE — Telephone Encounter (Signed)
NA

## 2023-02-03 NOTE — Progress Notes (Signed)
Virtual Visit via Video Note  I connected with Diane Pacheco on 02/03/23 at  9:00 AM EDT by a video enabled telemedicine application and verified that I am speaking with the correct person using two identifiers.  Location: Patient: home Provider: home office   I discussed the limitations of evaluation and management by telemedicine and the availability of in person appointments. The patient expressed understanding and agreed to proceed.   I discussed the assessment and treatment plan with the patient. The patient was provided an opportunity to ask questions and all were answered. The patient agreed with the plan and demonstrated an understanding of the instructions.   The patient was advised to call back or seek an in-person evaluation if the symptoms worsen or if the condition fails to improve as anticipated.  I provided 45 minutes of non-face-to-face time during this encounter.  THERAPIST PROGRESS NOTE  Session Time: 9:00 AM to 9:45 AM  Participation Level: Active  Behavioral Response: CasualAlertappropriate  Type of Therapy: Individual Therapy  Treatment Goals addressed: stress management, supportive interventions to help patient be caretaker for family members, emotional regulation for anxiety, anger, depression, strategies to help her in caretaking that will help with stress  ProgressTowards Goals: Progressing-patient continues to use therapy for emotional regulation for coping  Interventions: Solution Focused, Strength-based, Supportive, and Other: Coping  Summary: Diane Pacheco is a 52 y.o. female who presents with daughter is pregnant basically homeless staying with her boyfriend's friend, kids with grandmother has kids because ex who is a drug addict messed up finances. Explored how she feels can't change it "can't put the toothpaste back in the tube" Bear not happy but he accepted. Look at the positives she is 25 and not 13. How are they going to support the  kid not sure. [name] has high anxiety has a dog pretty much support animal with her, keep her calm Pitbull patient not thrilled need to get rid of the dog would find a place a lot easier. First grandbaby.  A little excited about that part Bear will too as soon as see the baby he will love the baby.  Four days ago when to bed feeling fine woke up two hours pain from hip down let through leg, to ankle, to foot, nerve pain felt like electricity shot down her feet. Next day hobbling went to bed slept an hour woke up an hour more pain had mom take to emergency room couldn't take pain. Did X-ray, 2 CAT scans, MRI, IV, pain pills, was loopy, muscle relaxants. Need hip replacement not taking hips can give her Cortizone shots. Therapist asked why? Have it this young have it replaced in 20 years only 52 not operating on hip. Just have to deal. Give her physical therapy, exercise, shot once in awhile will be ok. The hip is the worse to be replaced too much to do for her to lose hip. Still hurts, still sore. Gabapentin and steroids makes her hungry. Will use things like lidocaine patches. Make appointment with family doctor and orthopedic doctor. Not as bad when got radiating pain to stop.  House is starting to look good, perfect size all need, living room coming to together. Has a robot to help with sweeping. Limping because it sores keep pushing nothing stops her. Bear said need to take easy can't have her broken, trying to slow down hard to do to turn it off. Some positives of living [place] good take out. Looking forward to some restaurants close by.  Working on moving in need safety rails shower curtain a few baseboard put on for water to run to refrigerator getting there. Taking a few days off cold then hip went out. Took him to emergency room couldn't breath going to CT tomorrow turned up humility on c-pap when dries up it gets goopy, sticks together and panics from the surgery. Last time when had surgery losing him,  woke up incubated.  Ended up taking him to urgent care trouble breathing, breathing treatment could breath so calmed down.  Decided to just get internet both have an digital antenna network for free and streaming channels and don't need anything else.  Talked about comedy as coping.     Patient had some big news for therapist daughter pregnant talked about her reaction cannot change it have to except it there is also the positive that her first [place] which she knows she will really enjoy.  Therapist noted the strategy of acceptance useful do not waste negative energy which is not helpful.  Also she has had problems with pain turns out she would need a new hip replacement.  Provided strategic reflection to talk about her options which makes the most sense patient explains reasons why she wants to put that off she will manage it with different strategies but will talk to her doctor.  Talked about house they are fixing up that it is coming together is a very positive thing also will enjoy some of the aspects of living in [place].  Worked on realistic coping skills with patient Suicidal/Homicidal: No  Plan: Return again in 2 weeks.2.Marland KitchenTherapist work with patient on stress management, grief, coping  Diagnosis: Major Depressive Disorder, recurrent, moderate (history of bipolar) Generalized Anxiety Disorder  Collaboration of Care: Other none needed  Patient/Guardian was advised Release of Information must be obtained prior to any record release in order to collaborate their care with an outside provider. Patient/Guardian was advised if they have not already done so to contact the registration department to sign all necessary forms in order for [place] to release information regarding their care.   Consent: Patient/Guardian gives verbal consent for treatment and assignment of benefits for services provided during this visit. Patient/Guardian expressed understanding and agreed to proceed.   Coolidge Breeze,  LCSW 02/03/2023

## 2023-02-17 ENCOUNTER — Ambulatory Visit (HOSPITAL_COMMUNITY): Payer: Medicare Other | Admitting: Licensed Clinical Social Worker

## 2023-02-17 DIAGNOSIS — F411 Generalized anxiety disorder: Secondary | ICD-10-CM

## 2023-02-17 DIAGNOSIS — F331 Major depressive disorder, recurrent, moderate: Secondary | ICD-10-CM | POA: Diagnosis not present

## 2023-02-17 NOTE — Progress Notes (Signed)
Virtual Visit via Video Note  I connected with Diane Pacheco on 02/17/23 at  9:00 AM EDT by a video enabled telemedicine application and verified that I am speaking with the correct person using two identifiers.  Location: Patient: home Provider: home office   I discussed the limitations of evaluation and management by telemedicine and the availability of in person appointments. The patient expressed understanding and agreed to proceed.   I discussed the assessment and treatment plan with the patient. The patient was provided an opportunity to ask questions and all were answered. The patient agreed with the plan and demonstrated an understanding of the instructions.   The patient was advised to call back or seek an in-person evaluation if the symptoms worsen or if the condition fails to improve as anticipated.  I provided 45 minutes of non-face-to-face time during this encounter.  THERAPIST PROGRESS NOTE  Session Time: 9:00 AM to 9:45 AM  Participation Level: Active  Behavioral Response: CasualAlertEuthymic  Type of Therapy: Individual Therapy  Treatment Goals addressed: tress management, supportive interventions to help patient be caretaker for family members, emotional regulation for anxiety, anger, depression, strategies to help her in caretaking that will help with stress  ProgressTowards Goals: Progressing-feeling good Halloween processing thoughts and feelings help with stressors as well as emphasizing positives to help with mood  Interventions: Solution Focused, Strength-based, and Supportivecoping  Summary: Diane Pacheco is a 52 y.o. female who presents with at house, relaxed feels good. Mom's house crowded more relaxed at her house. At Mom's no place to go every room someone there. Has been in there a week helps her hip. Mom's bed had to crawl from the bottom hurt and too old and hurt still "a little hitch in her giddy up" but better. A lot of relief just achy  when do too much not wake up dying like before. The house is small easy to keep clean. Doing little projects really love Halloween. Put together a fake crime scene people taking pictures of it. She is going to dress up as a fortuneteller. Showed therapist her new house all fixed up. Still have decorations to put up. It is finding a place for everything key to a clean house. Working on getting dishes done after dinner. Feels like she can breath. Memories from last place taking care of mom made it hard. Feels good to know they are the ones that fixed their house. Grilling a lot on charcoal grill. [name] has to go for another back surgery upper and lower one are metal 2-3 discs between the disc collapsed and press against spinal cord. Need it soon as possible. Asked the doctor while in there finish it do 3 and be done.  Dr. York Spaniel insurance will not pay for it if it is nothing wrong both therapist and patient think it would save further surgeries makes sense to do it. Wondered why Bear didn't get strength back in the legs. Can move leg and arms better when walks legs tend to bounce he basically couch cruise holding on to something with one hand. Normally on walker. Patient always outside.  Told [name] about [name] he was upset guided him to just look at it and smile. mile. The guy is 10 years older and has 4 kids can't afford. Viviano Simas and he doesn't care.  Carve pumpkins today. Feels good loves Halloween.  Has to call daddy tell him [name] pregnant probably won't remember tomorrow. Will go better than did with [name]. Patient's support  but he is particular. He thinks everything has to be right have to be like him. He knew he didn't want kids.        Therapist processed with patient being at her new house patient showed therapist around patient says feels a lot better being in her own place her hip feels better and more relaxed.  As we looked at her house a lot of things are new bright colors therapist very  enthusiastic for these changes for patient.  Talked about stressors.  Has to have a surgery next week utilize session for patient to process thoughts and feelings around stressors.  Noted Halloween wean something she really enjoys that being positive is well making the most of the holiday she is even can address up puts her in a good mood showed therapist decorations.  Talked about guiding her kids with good coping related to her daughter's pregnancy not getting upset not good to do any good have more acceptance and if she said just smile.  Therapist provided supportive interventions Suicidal/Homicidal: No  Plan: Return again in 2 weeks.2.Therapist work with patient on stress management, grief, coping  Diagnosis: Major Depressive Disorder, recurrent, moderate (history of bipolar) Generalized Anxiety Disorder  Collaboration of Care: Other none needed  Patient/Guardian was advised Release of Information must be obtained prior to any record release in order to collaborate their care with an outside provider. Patient/Guardian was advised if they have not already done so to contact the registration department to sign all necessary forms in order for [place] to release information regarding their care.   Consent: Patient/Guardian gives verbal consent for treatment and assignment of benefits for services provided during this visit. Patient/Guardian expressed understanding and agreed to proceed.   Coolidge Breeze, LCSW 02/17/2023

## 2023-03-03 ENCOUNTER — Ambulatory Visit (HOSPITAL_COMMUNITY): Payer: Medicare Other | Admitting: Licensed Clinical Social Worker

## 2023-03-03 DIAGNOSIS — F411 Generalized anxiety disorder: Secondary | ICD-10-CM

## 2023-03-03 DIAGNOSIS — F331 Major depressive disorder, recurrent, moderate: Secondary | ICD-10-CM | POA: Diagnosis not present

## 2023-03-03 NOTE — Progress Notes (Signed)
Virtual Visit via Video Note  I connected with Diane Pacheco on 03/03/23 at  9:00 AM EST by a video enabled telemedicine application and verified that I am speaking with the correct person using two identifiers.  Location: Patient: home Provider: home office   I discussed the limitations of evaluation and management by telemedicine and the availability of in person appointments. The patient expressed understanding and agreed to proceed.   I discussed the assessment and treatment plan with the patient. The patient was provided an opportunity to ask questions and all were answered. The patient agreed with the plan and demonstrated an understanding of the instructions.   The patient was advised to call back or seek an in-person evaluation if the symptoms worsen or if the condition fails to improve as anticipated.  I provided 39 minutes of non-face-to-face time during this encounter.  THERAPIST PROGRESS NOTE  Session Time: 9:00 AM to 9:39 AM  Participation Level: Active  Behavioral Response: CasualAlertEuthymic  Type of Therapy: Individual Therapy  Treatment Goals addressed: stress management, supportive interventions to help patient be caretaker for family members, emotional regulation for anxiety, anger, depression, strategies to help her in caretaking that will help with stress  ProgressTowards Goals: Progressing-patient processing thoughts and feelings related to recent events helps with stress management emotional regulation  Interventions: Solution Focused, Strength-based, Supportive, and Other: copoing  Summary: Diane Pacheco is a 52 y.o. female who presents with Diane Pacheco had surgery. He stayed one night in hospital thank goodness. He is doing alright he can't bend and twist has been helping him with things like the bathroom. Trying to protect him as much possible so can recover from the surgery.  Therapist noted what she believed was the main focus was pain and patient  said not main issue strength in legs not bouncing like he used to. He should recover. Always be in pain. Hates insurance company. Doctor prescribed Dilaudid to help post surgery. The insurance denied it said he can't have high dosage meds because already has pain meds. Those don't do anything for pain following surgery. Patient wanted to pay cash to not go through insurance and plan worked. This time he is willing to get physical therapy. He realizes how important it is. Normally spend the night with him all had recliner hurt her hips, fell asleep on floor for two hours. Couldn't get back to sleep, crying and not comfortable Diane Pacheco said to go home. Patient said will be back at 5 AM. A long two days. Doing a little better surgery last Thursday. What is the plan? Call to see when first post op she is doing alright with hip.  Diane Pacheco did genetic testing the other day.  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 Every morning sits outside she sits outside with tablet and coffee. Enjoys it. Discussed some shows she enjoys. [place] has on television. Loves it loves trivia. Another show drunk history. Get historian knows about a story gets drunk and has to tell the story. Actors are lip sinking and acting out. It is funny. It is their area of expertise. You get to laugh and learn. It makes learning and history more interesting. Beat watching true crime shows. Therapist shared her interest in these stories.       Had recent stressor of her partner needing surgery talked about events provider used empathetic listening direct questioning.  Assess helpful for patient to process thoughts and feelings helping her managing stressors.  Also noted humor  is helpful in coping patient shared some funny shows therapist thinks key often for coping dealing with difficulties in life.  Also showed patient circle of control found this helpful for herself thought it may help patient in dealing with life as well to see how much  is outside control the only thing she really can control is herself to use will thing to keep in mind.  Patient's caregiver needs very helpful to have an outlet as they are ongoing and can be stressful Suicidal/Homicidal: No  Plan: Return again in 2 weeks.2.Marland KitchenTherapist work with patient on stress management, grief, coping  Diagnosis: Major Depressive Disorder, recurrent, moderate (history of bipolar) Generalized Anxiety Disorder  Collaboration of Care: Other none needed  Patient/Guardian was advised Release of Information must be obtained prior to any record release in order to collaborate their care with an outside provider. Patient/Guardian was advised if they have not already done so to contact the registration department to sign all necessary forms in order for [place] to release information regarding their care.   Consent: Patient/Guardian gives verbal consent for treatment and assignment of benefits for services provided during this visit. Patient/Guardian expressed understanding and agreed to proceed.   Coolidge Breeze, LCSW 03/03/2023

## 2023-03-16 ENCOUNTER — Ambulatory Visit (HOSPITAL_COMMUNITY): Payer: Medicare Other | Admitting: Licensed Clinical Social Worker

## 2023-03-16 DIAGNOSIS — F331 Major depressive disorder, recurrent, moderate: Secondary | ICD-10-CM | POA: Diagnosis not present

## 2023-03-16 DIAGNOSIS — F411 Generalized anxiety disorder: Secondary | ICD-10-CM
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
## 2023-03-31 ENCOUNTER — Ambulatory Visit (HOSPITAL_COMMUNITY): Payer: Medicare Other | Admitting: Licensed Clinical Social Worker

## 2023-03-31 DIAGNOSIS — F411 Generalized anxiety disorder: Secondary | ICD-10-CM | POA: Diagnosis not present

## 2023-03-31 DIAGNOSIS — F331 Major depressive disorder, recurrent, moderate: Secondary | ICD-10-CM | POA: Diagnosis not present
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
## 2023-04-22 ENCOUNTER — Ambulatory Visit (HOSPITAL_COMMUNITY): Payer: Medicare Other | Admitting: Licensed Clinical Social Worker

## 2023-04-22 DIAGNOSIS — F411 Generalized anxiety disorder: Secondary | ICD-10-CM

## 2023-04-22 DIAGNOSIS — F331 Major depressive disorder, recurrent, moderate: Secondary | ICD-10-CM

## 2023-04-22 NOTE — Progress Notes (Signed)
 Virtual Visit via Video Note  I connected with Diane Pacheco on 04/22/23 at  9:00 AM EST by a video enabled telemedicine application and verified that I am speaking with the correct person using two identifiers.  Location: Patient: home Provider: home office   I discussed the limitations of evaluation and management by telemedicine and the availability of in person appointments. The patient expressed understanding and agreed to proceed.   I discussed the assessment and treatment plan with the patient. The patient was provided an opportunity to ask questions and all were answered. The patient agreed with the plan and demonstrated an understanding of the instructions.   The patient was advised to call back or seek an in-person evaluation if the symptoms worsen or if the condition fails to improve as anticipated.  I provided 43 minutes of non-face-to-face time during this encounter.  THERAPIST PROGRESS NOTE  Session Time: 9:00 AM to 9:43 AM  Participation Level: Active  Behavioral Response: CasualAlerta little anxious  Type of Therapy: Individual Therapy  Treatment Goals addressed: Work on anxiety, supportive interventions as caretaker that we will help her with stress in this role, emotional regulation   ProgressTowards Goals: Progressing-therapist processed thoughts and feelings to help with current stressors as well as focusing on positive experiences the holiday that help with mood  Interventions: Solution Focused, Strength-based, Supportive, and Other: coping  Summary: Diane Pacheco is a 53 y.o. female who presents with holidays went pretty well. The day before Christmas got to see Dad went to [name]'s. Did Christmas eve at his house there at noon so Lithium and Dad could be there at lunch. [name] here for the holidays got to see Beat's family. Sidra is coming out of shell. Now working at [employer] he does audio and video for [employer]. Spent about 3 hours there. [name] and [name]  talked on the phone and they made up patient forced them. Talked about sister, Hendricks, who self-diagnosed with leukemia on top of her numbers more than the doctor she is exhausting for family. She is smart can play any instrument, draw and paint. Has degrees but doesn't do anything. Bo, her brother, got a day off from husband and was there. Augustin lost grandmother that was sad, [name]'s wife. Found out she had cancer started her on radiation. PET scan full of cancer expecting more time with her, she passed.  Both patient and therapist realize with cancer roll of the dice how long you live what makes it so scary. Like Bear in throat cancer. He is having some trouble with his throat it is his swallowing it hurts bad. That is left over from the radiation and throat cancer and because of sleep apnea goop comes out and can't breath he is trying to not panic hard through. Knows the not breathing and falling out 2 years ago happened and had to have a trach. Mexican band in the neighborhood have new instruments could hear before New Year's. Patient and Heron enjoyed it. Watched Christmas movies for Christmas. Relaxing day patient made good collard greens for New Year's, cornbread for [place] who is from the [place].  Thinks she wants to have Bear take her bowling.  Anxiety is a little high but be alright. Always is. Looked at coping and she distracts, look at recipes.     Therapist reviewed treatment plan patient gave consent to complete virtually patient highlighted the anxiety that therapy helps her with.  Talked about holidays and patient shared they were good holidays  was able to spend time with her family. That included going to [name]'s, enjoyed Christmas stay with her and bear.  Updated therapist to other issues was nice to see her son Sidra who is in college in [place] , talked about barrier having to deal with ongoing discomfort of throat issues trying to cope with this.  Patient shared positive that her 2 kids made up and  then she also said she made them as a positive to step in as the mom.  In general nice to talk about positive experiences the holiday help with mood enhancement. Suicidal/Homicidal: No  Plan: Return again in 2 weeks.2.Therapist work with patient on stress management, grief, coping  Diagnosis: Major Depressive Disorder, recurrent, moderate (history of bipolar) Generalized Anxiety Disorder  Collaboration of Care: Other none needed  Patient/Guardian was advised Release of Information must be obtained prior to any record release in order to collaborate their care with an outside provider. Patient/Guardian was advised if they have not already done so to contact the registration department to sign all necessary forms in order for us  to release information regarding their care.   Consent: Patient/Guardian gives verbal consent for treatment and assignment of benefits for services provided during this visit. Patient/Guardian expressed understanding and agreed to proceed.   Ronal Sink, LCSW 04/22/2023

## 2023-05-05 ENCOUNTER — Ambulatory Visit (HOSPITAL_COMMUNITY): Payer: Medicare Other | Admitting: Licensed Clinical Social Worker

## 2023-05-05 DIAGNOSIS — F331 Major depressive disorder, recurrent, moderate: Secondary | ICD-10-CM | POA: Diagnosis not present

## 2023-05-05 DIAGNOSIS — F411 Generalized anxiety disorder: Secondary | ICD-10-CM

## 2023-05-05 NOTE — Progress Notes (Signed)
Virtual Visit via Video Note  I connected with Diane Pacheco on 05/05/23 at  9:00 AM EST by a video enabled telemedicine application and verified that I am speaking with the correct person using two identifiers.  Location: Patient: home Provider: home office   I discussed the limitations of evaluation and management by telemedicine and the availability of in person appointments. The patient expressed understanding and agreed to proceed.  I discussed the assessment and treatment plan with the patient. The patient was provided an opportunity to ask questions and all were answered. The patient agreed with the plan and demonstrated an understanding of the instructions.   The patient was advised to call back or seek an in-person evaluation if the symptoms worsen or if the condition fails to improve as anticipated.  I provided 47 minutes of non-face-to-face time during this encounter.  THERAPIST PROGRESS NOTE  Session Time: 9:00 AM to 9:47 AM  Participation Level: Active  Behavioral Response: CasualAlertappropriate  Type of Therapy: Individual Therapy  Treatment Goals addressed:  Work on anxiety, supportive interventions as caretaker that we will help her with stress in this role, emotional regulation  ProgressTowards Goals: Progressing-patient continues to have stresses caretaker helpful to have an outlet to talk about thoughts and feelings be provided with supportive interventions  Interventions: Solution Focused, Strength-based, Supportive, and Other: coping  Summary: Diane Pacheco is a 53 y.o. female who presents with [name] dog is dying. She has infection in uterus and blooding out. Can't afford the $2000 surgery.  Therapist very understanding knowing how hard this can be specially when it is out of the blue.  On top of that she got news happens in about 1-2 % cases a woman can have a rounded placenta which her daughter has can cause problems can rupture. That is scary normally  it ends up being fine although hard to get data with such a low percentage of this happening.  Therapist asked what they do patient says watch it all can lower stress and but right now her dog is dying. Right now has her under control told her that she is the dog's hospice care like patient was with mom. All can do is be there for her already has plans to cremate it.  Therapist impressed with this device.  Took Bear to the doctor. By the time got to car he said need to get to hospital airway is closing. Went to doctor's and they said go to hospital and need to see him right away. Said need to go to emergency room called the charge nurse. Got to the emergency 10 AM still there 10 AM next day. There were no beds in the hospital. Apparently people having more problems with flu, respiratory problems. Had to go home roast had been in oven for hours. They said need to be admitted. He convinced them to keep gurney in room so she had place to sleep. Giving him epinephrine to open airway. Doing all that not sure throat or lungs. Tested for every virus doesn't have virus not sick. Apparently radiation that keeps giving. Have on high dosage of steroids to bring down inflammation most of swelling in the voice box. Started breathing better after a day or two. Came home yesterday if this keeps happening have to put trach tube in to have established airway. Hooked up to his bi-pap last night and patient educated therapist on difference between that and CPAP.This is when sleep apnea bad and means blows out and sucks  in. Now he is saying doesn't want a trach patient said rather have him around and not gasping and dying Apparently can go from swollen to no airway very quickly. If he has to keep going to hospital will need it. Also patient in a joking ways said all this is her fault hasn't quit smoking and drinking if she doesn't he can't stop. Patient says a personal thing not put on partner. Not blame her for his issues. Put on  guilt trip. It is tough want to support him but shouldn't put that on her. Tries not to do around it but smells the smoke.  Therapist agrees with patient and said shouldn't have to follow the treatment protocol he has to follow.  He very much is dependent on her for example when in hospital stay wants her to stay with him where going she is security blanket "service bitch".  Therapist explored how she is handling this she says talks to therapist do the best she can. Hoping he had virus and that is.  Therapist can understand not wanting to explore certain topics creating more havoc could not come up with a better word to describe his behavior other than a type of dependency but both of [place] agree there is a better word for it.  Assessed therapy very helpful today as patient has been dealing with some significant stressors has an outlet to talk about her thoughts and feelings helps with coping helps to get supportive.  Therapist was validating and her response also impressed with some of ice she gave her daughter to help deal with the dying dog.  In terms of her partner helpful to have another therapist to talk about some of the frustrations have an outlet to do that helps her with coping.  Therapist provided empathetic listening direct questioning discussed current underlying problems noting patient's strengths in managing the stressors Suicidal/Homicidal: No  Plan: Return again in 2 weeks.2.Therapist work with patient on stress management, grief, coping  Diagnosis: Major Depressive Disorder, recurrent, moderate (history of bipolar) Generalized Anxiety Disorder   Collaboration of Care: Other none needed  Patient/Guardian was advised Release of Information must be obtained prior to any record release in order to collaborate their care with an outside provider. Patient/Guardian was advised if they have not already done so to contact the registration department to sign all necessary forms in order for [place] to  release information regarding their care.   Consent: Patient/Guardian gives verbal consent for treatment and assignment of benefits for services provided during this visit. Patient/Guardian expressed understanding and agreed to proceed.   Coolidge Breeze, LCSW 05/05/2023

## 2023-05-19 ENCOUNTER — Ambulatory Visit (HOSPITAL_COMMUNITY): Payer: Medicare Other | Admitting: Licensed Clinical Social Worker

## 2023-05-19 DIAGNOSIS — F411 Generalized anxiety disorder: Secondary | ICD-10-CM

## 2023-05-19 DIAGNOSIS — F331 Major depressive disorder, recurrent, moderate: Secondary | ICD-10-CM | POA: Diagnosis not present

## 2023-05-19 NOTE — Progress Notes (Signed)
Virtual Visit via Video Note  I connected with Diane Pacheco on 05/19/23 at  9:00 AM EST by a video enabled telemedicine application and verified that I am speaking with the correct person using two identifiers.  Location: Patient: home Provider: home office   I discussed the limitations of evaluation and management by telemedicine and the availability of in person appointments. The patient expressed understanding and agreed to proceed.   I discussed the assessment and treatment plan with the patient. The patient was provided an opportunity to ask questions and all were answered. The patient agreed with the plan and demonstrated an understanding of the instructions.   The patient was advised to call back or seek an in-person evaluation if the symptoms worsen or if the condition fails to improve as anticipated.  I provided 43 minutes of non-face-to-face time during this encounter.  THERAPIST PROGRESS NOTE  Session Time: 9:00 AM to 9:43 AM  Participation Level: Active  Behavioral Response: CasualAlertEuthymic  Type of Therapy: Individual Therapy  Treatment Goals addressed: Work on anxiety, supportive interventions as caretaker that we will help her with stress in this role, emotional regulation   ProgressTowards Goals: Progressing-patient gets therapeutic benefit from sessions mood enhancement and processing thoughts and feelings helpful for stress Interventions: Solution Focused, Strength-based, Supportive, and Other: coping  Summary: Diane Pacheco is a 53 y.o. female who presents with went to [name] and fixed toilets. They replaced the pump debris got in water lines and the bulb take the water line out change the float.  Therapist impressed patient says just growing things in the therapist said has plumber come because lots of money so impressed. [name]'s dog passed almost a blessing she didn't need that stress. She came and spent the night the day after. She didn't want  to be alone while every one at work. Chit Chat with each other. Bear pretty much quit smoking cigarettes he takes a draw once in awhile. His medical issues pop up again hard time sleeping has to do with c-pap and when he lies down and his throat doesn't like it if lays down, or exerts himself. Issue his voice box and airway closes. Needs a new c-pap making noises won't pay until October and that is ridiculous if broke need to fix it. B-pap he loves once used to it can't sleep without it.  Working a Mining engineer, doors hung up.  Talked about shows enjoying and getting back to "House" and therapist wants to get into that show like the star actor. Looking at shows will go from actor to another and movies that they are.  Shared how to make an [place] recipe something therapist really likes and patient does to  Noted thing is have settled down for patient that is positive.  Although various medical issues are starting to increase keeping an eye on that does not want to have to put a trachea in.  Talked about various subjects enhancing mood patient's interest in cooking talking about an [place] meal she made therapist was very interested in doing that herself, therapist noted central placed that watching shows has does for patient bear as well for bear a lot of times it is movies noted the strategy of following a movie star is a good 1 see a lot of good things.  Patient spending time fixing up house being out family visiting with daughter different things keeping her busy sharing with therapist about what is going on in life and updates.  Therapist provided support and space for patient to talk about thoughts and feelings in session.     Suicidal/Homicidal: No  Plan: Return again in 2 weeks.2.Therapist work with patient on stress management, grief, coping  Diagnosis: Major Depressive Disorder, recurrent, moderate (history of bipolar) Generalized Anxiety Disorder  Collaboration of Care: Other none  needed  Patient/Guardian was advised Release of Information must be obtained prior to any record release in order to collaborate their care with an outside provider. Patient/Guardian was advised if they have not already done so to contact the registration department to sign all necessary forms in order for [place] to release information regarding their care.   Consent: Patient/Guardian gives verbal consent for treatment and assignment of benefits for services provided during this visit. Patient/Guardian expressed understanding and agreed to proceed.   Coolidge Breeze, LCSW 05/19/2023

## 2023-05-27 ENCOUNTER — Ambulatory Visit (INDEPENDENT_AMBULATORY_CARE_PROVIDER_SITE_OTHER): Payer: Medicare Other | Admitting: Podiatry

## 2023-05-27 ENCOUNTER — Encounter: Payer: Self-pay | Admitting: Podiatry

## 2023-05-27 ENCOUNTER — Ambulatory Visit (INDEPENDENT_AMBULATORY_CARE_PROVIDER_SITE_OTHER): Payer: Medicare Other

## 2023-05-27 DIAGNOSIS — M7752 Other enthesopathy of left foot: Secondary | ICD-10-CM

## 2023-05-27 DIAGNOSIS — M778 Other enthesopathies, not elsewhere classified: Secondary | ICD-10-CM

## 2023-05-27 DIAGNOSIS — M25572 Pain in left ankle and joints of left foot: Secondary | ICD-10-CM | POA: Diagnosis not present

## 2023-05-27 MED ORDER — DICLOFENAC SODIUM 75 MG PO TBEC
75.0000 mg | DELAYED_RELEASE_TABLET | Freq: Two times a day (BID) | ORAL | 0 refills | Status: DC
Start: 2023-05-27 — End: 2023-06-10

## 2023-05-27 MED ORDER — TRIAMCINOLONE ACETONIDE 10 MG/ML IJ SUSP
5.0000 mg | Freq: Once | INTRAMUSCULAR | Status: AC
Start: 2023-05-27 — End: 2023-05-27
  Administered 2023-05-27: 5 mg

## 2023-05-27 NOTE — Progress Notes (Signed)
 Chief Complaint  Patient presents with   Foot Pain    Left foot. States pain is located on the top of foot. Present for about a month. Denies known injury. Always on her feet. When she first wakes up, 9.5/10 but reduces to 3/10. Sharp pain, non-radiating. Not diabetic. Not on anticoag therapy.     HPI: 53 y.o. female presents today for above-stated complaint.  Endorses pain to the hindfoot and to the top of the left foot.  States it is worse with weightbearing.  Denies any specific injury.  Worse earlier in the day but improves later on.  Describes as a sharp pain.  Past Medical History:  Diagnosis Date   Asthma    exacerbations w/ respiratory infxns. Well controlled w/ abuterol PRN   Condyloma acuminatum    COPD (chronic obstructive pulmonary disease) (HCC)    Major depression    Unable to take antidepressents due to intolerance. Prefers to handle w/o Rx    Past Surgical History:  Procedure Laterality Date   HEMORRHOID SURGERY  2020   unsure of exact year   TUBAL LIGATION      No Known Allergies  ROS denies any nausea, vomiting, fever, chills, chest pain, shortness of breath.   Physical Exam: There were no vitals filed for this visit.  General: The patient is alert and oriented x3 in no acute distress.  Dermatology: Skin is warm, dry and supple bilateral lower extremities. Interspaces are clear of maceration and debris.    Vascular: Palpable pedal pulses bilaterally. Capillary refill within normal limits.  No appreciable diffuse edema.  No erythema or calor.  Neurological: Light touch sensation grossly intact bilateral feet.   Musculoskeletal Exam: Pain on palpation of left sinus tarsi.  Localized edema present.  Tenderness on palpation of anterior ankle extensor tendons, mostly EHL, TA.  Tenderness on palpation of EDB and EHB muscle bellies.  Muscle strength 4/5 in dorsiflexion, inversion and eversion secondary due to guarding.  Pain with passive subtalar joint  inversion and eversion.  Radiographic Exam: Left foot 05/27/2023 3 views weightbearing Normal osseous mineralization. Joint spaces preserved.  No fractures or osseous irregularities noted.  Plantar heel spur present  Assessment/Plan of Care: 1. Sinus tarsi syndrome of left foot   2. Left ankle tendinitis      Meds ordered this encounter  Medications   triamcinolone  acetonide (KENALOG ) 10 MG/ML injection 5 mg   diclofenac  (VOLTAREN ) 75 MG EC tablet    Sig: Take 1 tablet (75 mg total) by mouth 2 (two) times daily for 14 days.    Dispense:  28 tablet    Refill:  0   None  Discussed clinical findings with patient today.  # Sinus tarsi pain left foot -Radiographs reviewed with patient -Recommend corticosteroid injection.  Patient gave verbal consent.  Procedure as described below -Discussed importance of good supportive shoe gear.  Compressive anklet dispensed -Follow-up in approximately 2 weeks  Procedure: Injection left subtalar joint Discussed alternatives, risks, complications and verbal consent was obtained.  Location: Left subtalar joint. Skin Prep: Betadine. Injectate: 1cc 0.5% marcaine  plain  0.5 cc kenalog   Disposition: Patient tolerated procedure well. Injection site dressed with a band-aid.  Post-injection care was discussed and return precautions discussed.   # Anterior ankle extensor tendinitis Discussed the etiology and treatment options for ankle tendinitis.  We discussed that these types of injuries are overuse injuries and respond well to anti-inflammatories, rest, immobilization.  I reviewed RICE protocol with  the patient.  Recommend the following treatment plan:  -Immobilization with lace up ankle brace -Oral anti-inflammatories prescribed: Oral diclofenac  75 mg twice daily for 2 weeks -Recommended topical diclofenac  gel 1% to apply daily 3-4 times on the painful areas -Will discuss stretching regimen at follow up after period of rest. -I certify that this  diagnosis represents a distinct and separate diagnosis that requires evaluation and treatment separate from other procedures or diagnosis    Haralambos Yeatts L. Lamount MAUL, AACFAS [facility]     [address] [place], [place] [address]                Office [phone]  Fax [phone]

## 2023-06-02 ENCOUNTER — Ambulatory Visit (HOSPITAL_COMMUNITY): Payer: Medicare Other | Admitting: Licensed Clinical Social Worker

## 2023-06-10 ENCOUNTER — Ambulatory Visit (INDEPENDENT_AMBULATORY_CARE_PROVIDER_SITE_OTHER): Payer: Medicare Other | Admitting: Podiatry

## 2023-06-10 ENCOUNTER — Encounter: Payer: Self-pay | Admitting: Podiatry

## 2023-06-10 DIAGNOSIS — M7750 Other enthesopathy of unspecified foot: Secondary | ICD-10-CM

## 2023-06-10 DIAGNOSIS — M25572 Pain in left ankle and joints of left foot: Secondary | ICD-10-CM | POA: Diagnosis not present

## 2023-06-10 MED ORDER — DICLOFENAC SODIUM 75 MG PO TBEC: 75.0000 mg | DELAYED_RELEASE_TABLET | Freq: Two times a day (BID) | ORAL | 0 refills | Status: AC

## 2023-06-10 NOTE — Progress Notes (Signed)
       Chief Complaint  Patient presents with   Routine Post Op    Patient states her left foot still hurts a little bit but she is doing alright     HPI: 53 y.o. female presents for left hindfoot and ankle pain follow-up.  She reports some improvement with the steroid injection.  Reports approximately 60% improvement.  Most of her pain appears to be dorsal lateral left hindfoot and midfoot today.  Past Medical History:  Diagnosis Date   Asthma    exacerbations w/ respiratory infxns. Well controlled w/ abuterol PRN   Condyloma acuminatum    COPD (chronic obstructive pulmonary disease) (HCC)    Major depression    Unable to take antidepressents due to intolerance. Prefers to handle w/o Rx    Past Surgical History:  Procedure Laterality Date   HEMORRHOID SURGERY  2020   unsure of exact year   TUBAL LIGATION      No Known Allergies  ROS denies any nausea, vomiting, fever, chills, chest pain, shortness of breath.   Physical Exam: There were no vitals filed for this visit.  General: The patient is alert and oriented x3 in no acute distress.  Dermatology: Skin is warm, dry and supple bilateral lower extremities. Interspaces are clear of maceration and debris.    Vascular: Palpable pedal pulses bilaterally. Capillary refill within normal limits.  No appreciable diffuse edema.  No erythema or calor.  Neurological: Light touch sensation grossly intact bilateral feet.   Musculoskeletal Exam: Point of maximum tenderness EDB and EHB muscle bellies with localized edema here.  Some tenderness on palpation of sinus tarsi.  Muscle strength 4/5 in dorsiflexion, inversion and eversion secondary due to guarding.  Pain with passive subtalar joint inversion and eversion.  Radiographic Exam: Left foot 05/27/2023 3 views weightbearing Normal osseous mineralization. Joint spaces preserved.  No fractures or osseous irregularities noted.  Plantar heel spur present  Assessment/Plan of Care: 1.  Tendinitis of ankle or foot   2. Sinus tarsi syndrome of left foot      Meds ordered this encounter  Medications   diclofenac (VOLTAREN) 75 MG EC tablet    Sig: Take 1 tablet (75 mg total) by mouth 2 (two) times daily for 7 days.    Dispense:  14 tablet    Refill:  0   None  Discussed clinical findings with patient today.  Continue with compressive anklet. Extended course of oral diclofenac twice daily 75 mg for another week.  Okay to continue topical Voltaren gel.  Continue with RICE protocol. Recommend use of good supportive shoe gear.  Did discuss that this tendinitis is essentially an overuse injury and at times may require periods of immobilization in CAM Walker boot or use of physical therapy.  Foot and ankle rehabilitation exercises discussed with patient with instructions given.  Follow-up in approximately 4 weeks  Tameyah Koch L. Marchia Bond, AACFAS [facility]     [address] [place], [place] [address]                Office [phone]  Fax [phone]

## 2023-06-13 ENCOUNTER — Encounter: Payer: Self-pay | Admitting: Podiatry

## 2023-06-16 ENCOUNTER — Ambulatory Visit (INDEPENDENT_AMBULATORY_CARE_PROVIDER_SITE_OTHER): Payer: Medicare Other | Admitting: Licensed Clinical Social Worker

## 2023-06-16 DIAGNOSIS — F411 Generalized anxiety disorder: Secondary | ICD-10-CM | POA: Diagnosis not present

## 2023-06-16 DIAGNOSIS — F331 Major depressive disorder, recurrent, moderate: Secondary | ICD-10-CM

## 2023-06-16 NOTE — Progress Notes (Signed)
 Virtual Visit via Video Note  I connected with Diane Pacheco on 06/16/23 at  9:00 AM EST by a video enabled telemedicine application and verified that I am speaking with the correct person using two identifiers.  Location: Patient: home Provider: home office   I discussed the limitations of evaluation and management by telemedicine and the availability of in person appointments. The patient expressed understanding and agreed to proceed.   I discussed the assessment and treatment plan with the patient. The patient was provided an opportunity to ask questions and all were answered. The patient agreed with the plan and demonstrated an understanding of the instructions.   The patient was advised to call back or seek an in-person evaluation if the symptoms worsen or if the condition fails to improve as anticipated.  I provided 40 minutes of non-face-to-face time during this encounter.  THERAPIST PROGRESS NOTE  Session Time: 9:00 AM to 9:40 AM  Participation Level: Active  Behavioral Response: CasualAlertappropriate  Type of Therapy: Individual Therapy  Treatment Goals addressed: Work on anxiety, supportive interventions as caretaker that we will help her with stress in this role, emotional regulation   ProgressTowards Goals: Progressing-today patient really shared about stress of being a caregiver tired of routine does not have a lot of freedom therapist encouraged her to add more fun things to help with her mental health patient is planning to do that look at cooking shows  Interventions: Solution Focused, Strength-based, Supportive, and Reframing  Summary: Diane Pacheco is a 53 y.o. female who presents with going crazy because each day is the same. Get up coffee play games everything the same every day stays the house. Has to take care of him doesn't want her to leave him. If he goes out wants to shop doesn't like shopping. Wants to go bowling doesn't have the money this  month.  Therapist encouraged patient to take him with her when she can afford it he can sit and watch and she can have fun order some snacks and drinks. She takes time in the grocery store in parking lot listen to her music roll down the windows.  Therapist and patient laughed as a way to get away.  This noted and brainstormed with patient ways to be creative cannot get out what are something she can do in the house that would make it more interesting. If she is on tablet he doesn't like it. Hates that people's face are in a tablet. Patient has said no different than TV. Her thing is supposed to be his.  Therapist said what is more healthy in a relationship and patient agrees to get some space give a chance to miss each other also there is nothing wrong with patient doing things she enjoys nothing wrong with being on a tablet it is like television having access to things that are entertaining and make you laugh to therapist not even an issue would help for so why not be on the tablet.  Noted the things she is able to do she enjoys does her video games and able to listen to tablet when washing dishes. She hangs  hangs out in the grocery store parking lot. Wonder if bought a book if he would say that was not allowed therapist noted hard to believe as that is something that is good for her. Patient likes books too restless thought to sit and read. Tournament for cooking coming up wants to carve out time for that.  Therapist noted that as  part of our strategy putting things adding things into schedule so that would be a good idea.  He likes her to watch movies in evening can sit still though.  Like shows about crime murder [employer] therapist patient shared looks at [employer] CorporationExplore with [place]."  Therapist gave patient his homework assignment add something fun to her schedule. Therapist offered emotional support, active listening and validation of patient's emotional experience as appropriate during  session  Suicidal/Homicidal: No  Plan: Return again in 2 weeks.2.Therapist work with patient on stress management, grief, coping  Diagnosis: Major Depressive Disorder, recurrent, moderate (history of bipolar) Generalized Anxiety Disorder  Collaboration of Care: Other none needed  Patient/Guardian was advised Release of Information must be obtained prior to any record release in order to collaborate their care with an outside provider. Patient/Guardian was advised if they have not already done so to contact the registration department to sign all necessary forms in order for [place] to release information regarding their care.   Consent: Patient/Guardian gives verbal consent for treatment and assignment of benefits for services provided during this visit. Patient/Guardian expressed understanding and agreed to proceed.   Coolidge Breeze, [place] 06/16/2023

## 2023-06-29 ENCOUNTER — Ambulatory Visit (HOSPITAL_COMMUNITY): Payer: Medicare Other | Admitting: Licensed Clinical Social Worker

## 2023-06-29 DIAGNOSIS — F331 Major depressive disorder, recurrent, moderate: Secondary | ICD-10-CM

## 2023-06-29 DIAGNOSIS — F411 Generalized anxiety disorder: Secondary | ICD-10-CM | POA: Diagnosis not present

## 2023-06-29 NOTE — Progress Notes (Signed)
 Virtual Visit via Video Note  I connected with Diane Pacheco on 06/29/23 at  9:00 AM EDT by a video enabled telemedicine application and verified that I am speaking with the correct person using two identifiers.  Location: Patient: Outdoors outside hospital Provider: home office   I discussed the limitations of evaluation and management by telemedicine and the availability of in person appointments. The patient expressed understanding and agreed to proceed.   I discussed the assessment and treatment plan with the patient. The patient was provided an opportunity to ask questions and all were answered. The patient agreed with the plan and demonstrated an understanding of the instructions.   The patient was advised to call back or seek an in-person evaluation if the symptoms worsen or if the condition fails to improve as anticipated.  I provided 40 minutes of non-face-to-face time during this encounter.  THERAPIST PROGRESS NOTE  Session Time: 9:00 AM to 9:40 AM  Participation Level: Active  Behavioral Response: CasualAlertAngry and Depressed  Type of Therapy: Individual Therapy  Treatment Goals addressed: Work on anxiety, supportive interventions as caretaker that we will help her with stress in this role, emotional regulation   ProgressTowards Goals: Progressing-today actively working on treatment goal stress is significantly elevated as caretaker is her partner needs a trach as medical necessity and he is resisting therapist provided supportive strength-based interventions agreeing with patient encouraging her with her efforts to persuade him to make healthy decision for himself.  Interventions: Solution Focused, Strength-based, Supportive, and Other: Coping  Summary: Diane Pacheco is a 53 y.o. female who presents with Bear in hospital his throat swollen to the point can't swallow here since last Wednesday want to put trach in and "he is being an ass". Don't want to take  him when home and can't breath to emergency room for 30 hours for emergency surgery. More risk is involved. Patient says with a trach his  quality of life improve by 95%. His argument is what if permanent and what if doesn't like it. Best choice is for him to get it quality of life improve significantly and not risk of not breathing. All of the doctors team of 8 doctors ENT tell him to get it, his Mom is telling him, patient wants him to get them. Any time can collapse have to go to emergency trach if make it. He says done ready to go. Frustrating spend every night go home for 40 minutes to feed pets, change underwear every time has a cigarette gives her a hard time but patient stressed. Not taking him out of town without trach. They could go in and stretch esophagus put in breathing hole in trachea behind is esophagus could stretch that to make it bigger swallowing food better. Help his sleep apnea. Emergency one chance bleeding out into his lungs not controlled environment. It is medically necessary. Only thing he is upset about can't swim he goes swimming once a year only exercise but rather not be dead. Rather have a husband with hole then no husband. She cries and he gets mad crying. He won't get it for spite. He is a dog with a bone. Afraid going to make him go home. So stressed out not funny. Go home and not make it back he doesn't care. Don't want to be there when she is panicking and can't do anything about it.  Patient explains that she says do not make it about you meanwhile therapist thinks he is not really thinking about  her more about himself even if what he is going through is tough. Know it is a big deal. Rather just have him alive. It is not that hard. If me patient says would do what the doctor tells [place] to do.  Talked about giving her hard time for smoking and therapist understands need to comfort even if bad habit. Can't smoke drink drinks slept in recliner for 7 days finally had mom buy air  mattress she always puts him first. Have more sense. Life will be better not out of breath grasping for air. Shouldn't have to work to breath.Tree at the house fell on property line hit neighbor's house and fence think just damaged their fence. On property line literally between two fences. Went outside to smoke cigarette and the window button stuck down can't roll it up have to put on bags and tapes. Have to take down to make a turn. Sat and cried what else can go wrong?.Look for silver linings warming. Don't have time to get window fixed and have Diane Pacheco fix tree. At the hospital when gone already asking when coming back. He is pissed she came down for therapy therapist noted she needing it. He had a severe episode yesterday can't go two hours without getting mad at her he gets worked up about mundane things. If don't understand directions the way he says it. OCD worse with age. Not a question liking it he needs it. Catch the doctor and strong arm him. Therapist noted that is all she can do. Got irritated this morning when he said not going to do out of spite patient says you going to sit alone not going to watch you die. Tired. Sad when only one fighting for it. Wants to be there when Dr. Pollyann Kennedy time her things not get mad in front of doctor maybe hold his tongue. Noted Mom on her team and noted consequences from his decision that she can't take him out of town.   Assess helpful to have a session today patient experiencing a significant stressor her partner needs a trach despite medical necessity and advice from doctors as well as herself and his mom refusing to get it almost now as if he is not doing it out of spite because he is stubborn.  Validated patient on her frustration and overcome with emotion.  Noting he is not thinking sensibly situation where it is not really whether he likes it or not but something that can help keep him alive and improve his quality of life even with the sensible reasons with the  vice from Dr. He is resisting understandably frustrating for patient good to have an outlet to get support.  Noted strategy includes going back in and having doctors on her team to continue to try to help him think sensibly.  Also him will note consequences if he does not make healthy decisions including not taking him out of town.  Therapist provided space and support for patient to talk about thoughts and feelings in session.  Suicidal/Homicidal: No  Plan: Return again in 2 weeks.2.  Continue to abide supportive strength-based intervention as patient going through severe stressors caretaker encouraging her partner to make healthy decision processing thoughts and feelings in session to help with coping  Diagnosis: Major Depressive Disorder, recurrent, moderate (history of bipolar) Generalized Anxiety Disorder  Collaboration of Care: Other none needed  Patient/Guardian was advised Release of Information must be obtained prior to any record release in order to collaborate their care  with an outside provider. Patient/Guardian was advised if they have not already done so to contact the registration department to sign all necessary forms in order for [place] to release information regarding their care.   Consent: Patient/Guardian gives verbal consent for treatment and assignment of benefits for services provided during this visit. Patient/Guardian expressed understanding and agreed to proceed.   Coolidge Breeze, LCSW 06/29/2023

## 2023-07-08 ENCOUNTER — Ambulatory Visit: Payer: Medicare Other | Admitting: Podiatry

## 2023-07-14 ENCOUNTER — Ambulatory Visit (INDEPENDENT_AMBULATORY_CARE_PROVIDER_SITE_OTHER): Payer: Medicare Other | Admitting: Licensed Clinical Social Worker

## 2023-07-14 DIAGNOSIS — F411 Generalized anxiety disorder: Secondary | ICD-10-CM

## 2023-07-14 DIAGNOSIS — F331 Major depressive disorder, recurrent, moderate: Secondary | ICD-10-CM | POA: Diagnosis not present

## 2023-07-14 NOTE — Progress Notes (Signed)
 Virtual Visit via Video Note  I connected with Diane Pacheco on 07/14/23 at  9:00 AM EDT by a video enabled telemedicine application and verified that I am speaking with the correct person using two identifiers.  Location: Patient: home Provider: home office   I discussed the limitations of evaluation and management by telemedicine and the availability of in person appointments. The patient expressed understanding and agreed to proceed.   I discussed the assessment and treatment plan with the patient. The patient was provided an opportunity to ask questions and all were answered. The patient agreed with the plan and demonstrated an understanding of the instructions.   The patient was advised to call back or seek an in-person evaluation if the symptoms worsen or if the condition fails to improve as anticipated.  I provided 45 minutes of non-face-to-face time during this encounter.  THERAPIST PROGRESS NOTE  Session Time: 9:00 AM to 9:45 AM  Participation Level: Active  Behavioral Response: CasualAlertappropriate  Type of Therapy: Individual Therapy  Treatment Goals addressed: Work on anxiety, supportive interventions as caretaker that we will help her with stress in this role, emotional regulation   ProgressTowards Goals: Progressing-therapy continues to be helpful for patient as she plays main role of caretaker on ongoing ongoing basis to the extent now of having to provide support for her loved 1 who has a trach now processed thoughts and feelings in session to help with coping.  Interventions: Motivational Interviewing, Strength-based, Supportive, and Other: coping  Summary: Diane Pacheco is a 53 y.o. female who presents with last two weeks have been hell. [name] got the trach tube another 7 days in hospital. Denyse Amass, his brother, passed he had a heart attack.  Therapist reviewing his health issues patient said doing all right although took a lot of pain pills, passed away  in the middle of the night. Bear's brother he is 52. They went into crisis mode [name] said want out of the hospital that moment. Impossible at 7:30 AM got them out by 11:30 AM. Left after got a suction machine-run in trach bottom sucks out the [employer]. High maintenance with this suction tube, gooey producing a lot of green ick. Busy taking care of him. Braelyn with mom not great situation she is crazy didn't want the kids now she gets death benefits. So of course wants her now. Won't give address. [place] says stay away from all that grandmother went away from vacation said "fuck you".Patient has plenty to focus on needs attention  Still getting used to getting the hang of cleaning out inner tube make sure it stays clean.  Patient shared another incident switch nebulizers nebulizer eye turned white after viewing went back to ER.  Therapist noted a side effect like this would indicate something terribly wrong turns out think a side effect of the medication. Beat with the trach doing alright getting used to it. Breathing better not struggling to breath just winded. Therapist asked how talked in to it. Everybody said need to have it so he said fine. After got it trouble eating. Mushy diet for awhile traded out trach it is just time. Not aspirated and eating soft food. Sent her home with half hospital stuff need to take care of. Once a routine ought to be ok once secretions stops. They do get down time.Take a nap every day. Morning thing eat lunch, shopping, take a nap for two hours. He is not waking up panicky can't breath. Vacation coming up. [name]  getting big all in her belly. Everybody comes to patient and she comes to therapist. She is 7 months pregnant. They got a house. They got the two little ones and the older one with grandmother to finish the school year.  Therapist reviewed symptoms and events since last appointment noting last time patient in crisis mode that continued as patient says continued to  be a lot although now at home getting used to things. Therapist used CBT to engage using active listening and positive emotional support toward her thoughts and feelings  therapists use CBT to ask the client open-ended questions about the source of her anxiety.  Clearly patient continues to have main role as caretaker utilizes therapy as a support a way to process thoughts and feelings and helping her manage this role.  Viewed some of the positive barrier will be able to still enjoy life even though this was a hard decision for him not trouble breathing for example will be going on vacation a couple weeks.  Therapist played active listening open questions supportive and strength-based interventions.     .   Suicidal/Homicidal: No  Plan: Return again in 2 weeks.2. Continue to provide supportive strength-based interventions as patient going through severe stressors caretaker encouraging her partner to make healthy decision processing thoughts and feelings in session to help with coping  Diagnosis: Major Depressive Disorder, recurrent, moderate (history of bipolar) Generalized Anxiety Disorder  Collaboration of Care: Other none needed  Patient/Guardian was advised Release of Information must be obtained prior to any record release in order to collaborate their care with an outside provider. Patient/Guardian was advised if they have not already done so to contact the registration department to sign all necessary forms in order for [place] to release information regarding their care.   Consent: Patient/Guardian gives verbal consent for treatment and assignment of benefits for services provided during this visit. Patient/Guardian expressed understanding and agreed to proceed.   Coolidge Breeze, LCSW 07/14/2023

## 2023-08-04 ENCOUNTER — Ambulatory Visit (HOSPITAL_COMMUNITY): Payer: Medicare Other | Admitting: Licensed Clinical Social Worker

## 2023-08-04 DIAGNOSIS — F411 Generalized anxiety disorder: Secondary | ICD-10-CM | POA: Diagnosis not present

## 2023-08-04 DIAGNOSIS — F331 Major depressive disorder, recurrent, moderate: Secondary | ICD-10-CM | POA: Diagnosis not present

## 2023-08-04 NOTE — Progress Notes (Signed)
 Virtual Visit via Video Note  I connected with Diane Pacheco on 08/04/23 at  9:00 AM EDT by a video enabled telemedicine application and verified that I am speaking with the correct person using two identifiers.  [place]: Patient: beach Provider: home office   I discussed the limitations of evaluation and management by telemedicine and the availability of in person appointments. The patient expressed understanding and agreed to proceed.   I discussed the assessment and treatment plan with the patient. The patient was provided an opportunity to ask questions and all were answered. The patient agreed with the plan and demonstrated an understanding of the instructions.   The patient was advised to call back or seek an in-person evaluation if the symptoms worsen or if the condition fails to improve as anticipated.  I provided 40 minutes of non-face-to-face time during this encounter.  THERAPIST PROGRESS NOTE  Session Time: 9:00 AM to 9:40 AM  Participation Level: Active  Behavioral Response: CasualAlertappropriate  Type of Therapy: Individual Therapy  Treatment Goals addressed:  Work on anxiety, supportive interventions as caretaker that we will help her with stress in this role, emotional regulation   ProgressTowards Goals: Progressing-processed thoughts and feelings to help patient with stressors being a caretaker at the same time focused on being at the beach being able to relax is positive for her mental health  Interventions: Solution Focused, Strength-based, Supportive, and Other: coping  Summary: Diane Pacheco is a 53 y.o. female who presents with at the beach. Helps to get away. It has been a crazy month Kandee Keen passed when [place] was in the hospital. Janina Mayo irritates him a lot hole supposed to get smaller and get to the point a small hole close around the tube. A lot of extra air getting in under the tube that irritates him. Therapist noting having things to do now has a  trach. Patient says crush his pills, change bandages, clean trach 2-3 times a day. Not too bad.  Talk about some achy stuff sometimes and cleaning but patient used to this 1 who always does it. Therapist asked what do you do? Busy they like to shop hates it but goes anywhere. Bear mad she wants to lay around at home patient explains she works so here what to stop and relax. Down here he wants to go out and do this and do that. Went out seafood buffet but doesn't like seafood only like popcorn shrimp. Bear doesn't like her drinking while changing his bandages but he starts drinking 3 PM. Never been "good for goose good for gander". Ian Malkin and Baird Lyons went to cruise to [place]. Getting pictures from there. Like being some where not a home. Bear getting up at 5 AM in the morning. Watching sunrise in the morning. He is waking her up because can't breath he has trouble falling asleep make a pot of coffee. Patient usually lays down again. 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  Talked about how Mom likes to visit Kibbie's (sister) husband she passed last year spend time with him cook, hang out.  We both talked about how hard it can be losing a partner after 55 years. She goes on vacation with him as well. Reminds her of sister keeps that alive in her heart. Probably sister appreciate taking care of husband.  Therapist mention actually increases oxytocin to help other people so she is helping him and helping him helps her.   Hooked Roku certain videos all the shark horror videos. Loves grilling use  charcoal grill shared a little bit of technique have to mind your coals put meat on too late run out of heat. Uses chimney put charcoal paper in light it until grey empty out into the grill. Watching vacation movies or shark movies. Leave Sunday morning.  [name] is getting big with her pregnancy. Bear doing good lost weight. Patient is gaining weight not eating but her drinks.  Talked about getting healthy is positive but can be a struggle..     .    Patient at the beach noted the positives and the impact of being on the beach house for mental health positive terms of mood just being able to do what she wanted to enjoy yourself.  Patient shared some frustrations with being caretaker not be able to do everything her way helpful to share that in therapy to process her feelings although still enjoying herself.  She is doing activities she likes to do and therapist encouraging her with that whether it is going out to eat listening to her music watching her shows showed therapist view right on the beach which is lovely getting a nice ocean breeze therapist convinced good for physical and mental health. Therapist offered emotional support, active listening and validation of patient's emotional experience as appropriate during session.  Suicidal/Homicidal: No  Plan: Return again in 2 weeks.2.  Thoughts and feelings to help with stressors, particularly stressors of being a caretaker Diagnosis: Major Depressive Disorder, recurrent, moderate (history of bipolar) Generalized Anxiety Disorder  Collaboration of Care: Other none needed  Patient/Guardian was advised Release of Information must be obtained prior to any record release in order to collaborate their care with an outside provider. Patient/Guardian was advised if they have not already done so to contact the registration department to sign all necessary forms in order for us  to release information regarding their care.   Consent: Patient/Guardian gives verbal consent for treatment and assignment of benefits for services provided during this visit. Patient/Guardian expressed understanding and agreed to proceed.   Dallie Duel, LCSW 08/04/2023

## 2023-08-18 ENCOUNTER — Ambulatory Visit (HOSPITAL_COMMUNITY): Payer: Medicare Other | Admitting: Licensed Clinical Social Worker

## 2023-08-18 DIAGNOSIS — F331 Major depressive disorder, recurrent, moderate: Secondary | ICD-10-CM | POA: Diagnosis not present

## 2023-08-18 DIAGNOSIS — F411 Generalized anxiety disorder: Secondary | ICD-10-CM | POA: Diagnosis not present
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
## 2023-08-31 ENCOUNTER — Ambulatory Visit (INDEPENDENT_AMBULATORY_CARE_PROVIDER_SITE_OTHER): Admitting: Licensed Clinical Social Worker

## 2023-08-31 ENCOUNTER — Encounter (HOSPITAL_COMMUNITY): Payer: Self-pay

## 2023-08-31 DIAGNOSIS — F331 Major depressive disorder, recurrent, moderate: Secondary | ICD-10-CM

## 2023-08-31 DIAGNOSIS — F411 Generalized anxiety disorder: Secondary | ICD-10-CM

## 2023-08-31 NOTE — Progress Notes (Signed)
 Patient did not show for appointment.

## 2023-09-15 ENCOUNTER — Ambulatory Visit (HOSPITAL_COMMUNITY): Admitting: Licensed Clinical Social Worker

## 2023-09-29 ENCOUNTER — Ambulatory Visit (HOSPITAL_COMMUNITY): Admitting: Licensed Clinical Social Worker

## 2023-09-29 DIAGNOSIS — F411 Generalized anxiety disorder: Secondary | ICD-10-CM

## 2023-09-29 DIAGNOSIS — F331 Major depressive disorder, recurrent, moderate: Secondary | ICD-10-CM
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
## 2023-10-06 ENCOUNTER — Ambulatory Visit
Admission: RE | Admit: 2023-10-06 | Discharge: 2023-10-06 | Disposition: A | Discharge status: Home / Self Care | Source: Ambulatory Visit | Attending: Family Medicine | Admitting: Family Medicine

## 2023-10-06 VITALS — BP 152/95 | HR 95 | Temp 98.6°F | Resp 18

## 2023-10-06 DIAGNOSIS — L089 Local infection of the skin and subcutaneous tissue, unspecified: Secondary | ICD-10-CM | POA: Diagnosis not present

## 2023-10-06 DIAGNOSIS — T148XXA Other injury of unspecified body region, initial encounter: Secondary | ICD-10-CM | POA: Diagnosis not present

## 2023-10-06 MED ORDER — CEPHALEXIN 500 MG PO CAPS: 500.0000 mg | ORAL_CAPSULE | Freq: Two times a day (BID) | ORAL | 0 refills | Status: AC

## 2023-10-06 MED ORDER — CHLORHEXIDINE GLUCONATE 4 % EX SOLN: Freq: Every day | CUTANEOUS | 0 refills | Status: DC | PRN

## 2023-10-06 MED ORDER — MUPIROCIN 2 % EX OINT: 1.0000 | TOPICAL_OINTMENT | Freq: Two times a day (BID) | CUTANEOUS | 0 refills | Status: DC

## 2023-10-06 NOTE — ED Triage Notes (Signed)
 Pt reports a knot between great toe and second toe on the left foot, onset Monday morning.

## 2023-10-06 NOTE — ED Provider Notes (Signed)
 [facility]    CSN: [id number] Arrival date & time: 10/06/23  1018      History   Chief Complaint Chief Complaint  Patient presents with   Foot Pain    Blister or bug bite between toe? - Entered by patient    HPI Diane Pacheco is a 53 y.o. female.   Patient presenting today with a blister or bite between her left great toe and second toe that she first noticed about 4 days ago.  It has become larger, more painful and red since onset.  Denies fever, chills, drainage, bleeding, numbness, tingling, loss of range of motion, known injury to the area.  So far trying triple antibiotic cream with minimal relief.    Past Medical History:  Diagnosis Date   Asthma    exacerbations w/ respiratory infxns. Well controlled w/ abuterol PRN   Condyloma acuminatum    COPD (chronic obstructive pulmonary disease) (HCC)    Major depression    Unable to take antidepressents due to intolerance. Prefers to handle w/o Rx    Patient Active Problem List   Diagnosis Date Noted   Numbness and tingling in left arm 01/26/2022   Fatty liver 02/04/2020   Right upper quadrant pain 01/25/2020   Muscle strain 12/10/2019   Caregiver stress syndrome 06/15/2019   Fissure of vulva 09/22/2018   Bunion of great toe of right foot 02/10/2018   Hemorrhoids 02/10/2018   Stress incontinence 08/17/2017   Hyperlipidemia 05/27/2016   Bursitis of shoulder 01/07/2015   Bicipital tendinitis 01/07/2015   Chronic obstructive pulmonary disease (HCC) 06/25/2013   Bipolar disorder, unspecified (HCC) 06/19/2013   Rotator cuff tendonitis 06/19/2013   Obesity 07/15/2011   TOBACCO USER 01/25/2009    Past Surgical History:  Procedure Laterality Date   HEMORRHOID SURGERY  2020   unsure of exact year   TUBAL LIGATION      OB History   No obstetric history on file.      Home Medications    Prior to Admission medications   Medication Sig Start Date End Date Taking? Authorizing Provider   cephALEXin (KEFLEX) 500 MG capsule Take 1 capsule (500 mg total) by mouth 2 (two) times daily. 10/06/23  Yes Corbin Dess, PA-C  chlorhexidine (HIBICLENS) 4 % external liquid Apply topically daily as needed. 10/06/23  Yes Corbin Dess, PA-C  mupirocin  ointment (BACTROBAN ) 2 % Apply 1 Application topically 2 (two) times daily. 10/06/23  Yes Corbin Dess, PA-C  albuterol  (VENTOLIN  HFA) 108 [phone] Base) MCG/ACT inhaler Inhale 1-2 puffs into the lungs every 6 (six) hours as needed for wheezing or shortness of breath. INHALE ONE TO TWO PUFFS BY MOUTH EVERY 6 HOURS AS NEEDED FOR WHEEZING AND FOR SHORTNESS OF BREATH 12/30/21   Joelle Musca, MD  fluticasone  (FLONASE ) 50 MCG/ACT nasal spray Place 2 sprays into both nostrils daily. 01/25/23   Genora Kidd, MD    Family History Family History  Problem Relation Age of Onset   Breast cancer Mother    Cancer Mother        breast cancer at 20yo   Heart attack Mother        late 87s    COPD Mother    Dementia Father    Breast cancer Maternal Grandmother        around 33yo   Breast cancer Paternal Grandmother        around 80yo    Social History Social History   Tobacco Use  Smoking status: Every Day    Current packs/day: 1.00    Types: Cigarettes    Passive exposure: Current   Smokeless tobacco: Never   Tobacco comments:    Started smoking at age 67  Vaping Use   Vaping status: Never Used  Substance Use Topics   Alcohol use: Yes    Alcohol/week: 4.0 standard drinks of alcohol    Types: 4 Cans of beer per week    Comment: weekly weekends per pt, hard lemonade   Drug use: Yes    Types: Marijuana    Comment: pt states daily use     Allergies   Patient has no known allergies.   Review of Systems Review of Systems PER HPI  Physical Exam Triage Vital Signs ED Triage Vitals [10/06/23 1023]  Encounter Vitals Group     BP (!) 152/95     Girls Systolic BP Percentile      Girls Diastolic BP Percentile       Boys Systolic BP Percentile      Boys Diastolic BP Percentile      Pulse Rate 95     Resp 18     Temp 98.6 F (37 C)     Temp Source Oral     SpO2 95 %     Weight      Height      Head Circumference      Peak Flow      Pain Score 5     Pain Loc      Pain Education      Exclude from Growth Chart    No data found.  Updated Vital Signs BP (!) 152/95 (BP Location: Right Arm)   Pulse 95   Temp 98.6 F (37 C) (Oral)   Resp 18   LMP 12/21/2020   SpO2 95%   Visual Acuity Right Eye Distance:   Left Eye Distance:   Bilateral Distance:    Right Eye Near:   Left Eye Near:    Bilateral Near:     Physical Exam Vitals and nursing note reviewed.  Constitutional:      Appearance: Normal appearance. She is not ill-appearing.  HENT:     Head: Atraumatic.   Eyes:     Extraocular Movements: Extraocular movements intact.     Conjunctiva/sclera: Conjunctivae normal.    Cardiovascular:     Rate and Rhythm: Normal rate.  Pulmonary:     Effort: Pulmonary effort is normal.   Musculoskeletal:        General: Normal range of motion.     Cervical back: Normal range of motion and neck supple.   Skin:    General: Skin is warm and dry.     Comments: Blister with an erythematous base between the 1st and 2nd toe of the left foot.  Significantly tender to palpation.   Neurological:     Mental Status: She is alert and oriented to person, place, and time.     Comments: Left lower extremity neurovascularly intact  Psychiatric:        Mood and Affect: Mood normal.        Thought Content: Thought content normal.        Judgment: Judgment normal.      UC Treatments / Results  Labs (all labs ordered are listed, but only abnormal results are displayed) Labs Reviewed - No data to display  EKG   Radiology No results found.  Procedures Procedures (including critical care time)  Medications  Ordered in UC Medications - No data to display  Initial Impression / Assessment  and Plan / UC Course  I have reviewed the triage vital signs and the nursing notes.  Pertinent labs & imaging results that were available during my care of the patient were reviewed by me and considered in my medical decision making (see chart for details).     Treat with Keflex, Hibiclens, Bactroban , Epsom salt soaks, elevation.  Return for worsening symptoms.  Final Clinical Impressions(s) / UC Diagnoses   Final diagnoses:  Infected blister   Discharge Instructions   None    ED Prescriptions     Medication Sig Dispense Auth. Provider   cephALEXin (KEFLEX) 500 MG capsule Take 1 capsule (500 mg total) by mouth 2 (two) times daily. 14 capsule Corbin Dess, PA-C   chlorhexidine (HIBICLENS) 4 % external liquid Apply topically daily as needed. 236 mL Corbin Dess, PA-C   mupirocin  ointment (BACTROBAN ) 2 % Apply 1 Application topically 2 (two) times daily. 22 g Corbin Dess, [place]      PDMP not reviewed this encounter.   Corbin Dess, [place] 10/06/23 [phone]

## 2023-10-13 ENCOUNTER — Encounter (HOSPITAL_COMMUNITY): Payer: Self-pay

## 2023-10-13 ENCOUNTER — Ambulatory Visit (HOSPITAL_COMMUNITY): Admitting: Licensed Clinical Social Worker

## 2023-10-13 DIAGNOSIS — F411 Generalized anxiety disorder: Secondary | ICD-10-CM

## 2023-10-13 DIAGNOSIS — F331 Major depressive disorder, recurrent, moderate: Secondary | ICD-10-CM

## 2023-10-13 NOTE — Progress Notes (Signed)
 Husband was sick why patient late for session left message at office therapist not going to charge due to this situation needing her attention immediately

## 2023-10-27 ENCOUNTER — Ambulatory Visit (HOSPITAL_COMMUNITY): Admitting: Licensed Clinical Social Worker

## 2023-10-27 DIAGNOSIS — F411 Generalized anxiety disorder: Secondary | ICD-10-CM

## 2023-10-27 DIAGNOSIS — F331 Major depressive disorder, recurrent, moderate: Secondary | ICD-10-CM | POA: Diagnosis not present
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
## 2023-11-10 ENCOUNTER — Ambulatory Visit (HOSPITAL_COMMUNITY): Admitting: Licensed Clinical Social Worker

## 2023-11-10 DIAGNOSIS — F331 Major depressive disorder, recurrent, moderate: Secondary | ICD-10-CM

## 2023-11-10 DIAGNOSIS — F411 Generalized anxiety disorder: Secondary | ICD-10-CM

## 2023-11-10 NOTE — Progress Notes (Signed)
 Virtual Visit via Video Note  I connected with Diane Pacheco on 11/10/23 at  8:00 AM EDT by a video enabled telemedicine application and verified that I am speaking with the correct person using two identifiers.  Location: Patient: home Provider: home office   I discussed the limitations of evaluation and management by telemedicine and the availability of in person appointments. The patient expressed understanding and agreed to proceed. I discussed the assessment and treatment plan with the patient. The patient was provided an opportunity to ask questions and all were answered. The patient agreed with the plan and demonstrated an understanding of the instructions.   The patient was advised to call back or seek an in-person evaluation if the symptoms worsen or if the condition fails to improve as anticipated.  I provided 47 minutes of non-face-to-face time during this encounter.  THERAPIST PROGRESS NOTE  Session Time: 8:00 AM to 8:47 AM  Participation Level: Active  Behavioral Response: CasualAlertAnxious and appropriate in session  Type of Therapy: Individual Therapy  Treatment Goals addressed:  Work on anxiety, supportive interventions as caretaker that we will help her with stress in this role, emotional regulation progress  ProgressTowards Goals: Progressing-patient dealing with significant stress of being a caretaker has new equipment with trach challenging to get to know how to manage helpful to have an outlet to process thoughts and feelings to help with stressors  Interventions: Solution Focused, Strength-based, Supportive, and Other: coping  Summary: Diane Pacheco is a 53 y.o. female who presents with up half the night Bear couldn't  breath and coughing up a bunch of goo. Said this trach would help still wakes up and can't breath. He sleeps better late morning 9-10 to 3/4 puts her off schedule. Up every hour he gets irritated with it stays up and she goes back to  sleep. Needs her help. Regretting he had it done it even the radiation. He lets her know how much appreciation nobody else would deal with it. He gets irritated patient understands she is the only one there. He has to vent don't take it personally used to it. Just frustrated but understand can't be easy. Hoping the new one going to help out a lot. It will be a bigger hole extra hole build in tube let air come out of lungs and mouth easier. When decided to put in trach realized up keep not expecting all the snot. Comes straight out of lungs a lot green, sticky and sticks to everything. He tries to catch with tissue but goes sideways, goes up she dropped on his face accidentally thought was going to kill her. He has weak belly for her just snot and move on for him the end of times. There is mask over the trach have to get it up and over and didn't know goop and dropped on his face thought he was going to kill her. Felt bad.  Discussion went to random topics that interest patient like cooking, different gadgets for the kitchen that sound interesting.  Patient said working on intervention pad cut a whole put on the hole up to neck it will help Bear's skin not wet and irritated and catch the goop. The problem is lose fibers problems have to figure that out. Need something absorbent a lot of humidity and things that come out. SABRA  He is frustrated not happy he got it. A big deal if go anywhere. Have to take suction machine, a compressor, all his inner cans, extras, pads  and cleaning supplies. A lot of things.  How patient feels about it? She is not watching him  struggle for air even the case with C-PAP. Would have ended up killing him so so patient doesn't regret. If didn't the risk is he wouldn't be breathing, an emergency trach those are dangerous. The one he has was scheduled know what going in for prepared and slow and do it right. What kills people bleeding that is from the emergency hurry up and hack some bodies'  throat. Patient said it is just goop just go and wash hands.     Had to keep the baby overnight therapist noted hard and patient sad especially with both of them up. Wanted [name]'s baby overnight. Gave her break she could actually relax, sleep. The second night not wanting to go to bed. Renie time to get to bed and smile can't get mad. Now is not the time.  Therapist noted a lot involved and patient says yes hold the baby, feed the baby, change the baby. Laid her next to [facility] she grinned and trying to talk and a little coo is all the comes up. So cute smiles and flirts with him. She has him hooked.When on the way to [name]'s told somebody said a young ladies's game why babies in 20's rewarding but exhausting. Bear's daughters have kids two granddaughters don't see often Suzen is 8. Don't get along with mom poisoned them not legally dad even though biological dad. Really bad relationship. They talk to [name] their stepsister. When they were little patient had 5 kids running around. His two girls her kid's Zac and [name], Cody which is Cory's boy and [name]. Tons of them. Her thing caretaker could juggle the kids. Bear's kids she stopped sending them. Still tight with Velma have kids expect the grandparents come to them patient says the old people they are supposed to come to them. Not going to happen they won't leave the house. Called bring the kids to see Higinio Dub will bring and then don't see for four months not going to beg.        Patient has significant stress getting used to trach recently put in for her partner struggling with it at the same time patient is probably the best person to do this her strength is as a caretaker.  Deals with things a lot other people would have a hard time with like goo.  Assess very therapeutic for her to have an outlet to talk about her stress her partner can be mean helpful to have therapy where she is supported and can process thoughts and feelings.  She knows he is just frustrated  used to it so does not let her get her still helpful to have a supportive outlet.  Therapist highlighting her ability to take care of him is a significant strength a lot of people would not be able to do.  Noted helpful to have other topics helpful for mental health talked about her interest in cooking and gadgets she is recommending some gadgets for therapist that would be very helpful that she loves.  Talked about watching her grandchild how wonderful that was and wants to do that at least twice a month.  Very with his issues as well is something he enjoys.  Therapist realizes Dub never wanted this we talked about this was a necessity so that helps patient knowing this was needed in terms of motivation to do the work required for upkeep of the trach.  Noted will be getting 1 next week therapist using that as a reframing opportunity and noted hopefully it will be better less work. Therapist offered emotional support, active listening and validation of patient's emotional experience as appropriate during session.  Assess helpful to have therapy dealing with significant challenges of caretaking and partner who can be frustrated never wanting the trach helpful for patient to have an outlet to process thoughts and feelings to help with coping. Suicidal/Homicidal: No  Plan: Return again in 2 weeks.2.Process thoughts and feelings to help with stressors, particularly stressors of being a caretaker  Diagnosis: Major Depressive Disorder, recurrent, moderate (history of bipolar) Generalized Anxiety Disorder  Collaboration of Care: Other none needed  Patient/Guardian was advised Release of Information must be obtained prior to any record release in order to collaborate their care with an outside provider. Patient/Guardian was advised if they have not already done so to contact the registration department to sign all necessary forms in order for us  to release information regarding their care.   Consent:  Patient/Guardian gives verbal consent for treatment and assignment of benefits for services provided during this visit. Patient/Guardian expressed understanding and agreed to proceed.   Ronal Sink, LCSW 11/10/2023

## 2023-11-24 ENCOUNTER — Ambulatory Visit (INDEPENDENT_AMBULATORY_CARE_PROVIDER_SITE_OTHER): Admitting: Licensed Clinical Social Worker

## 2023-11-24 DIAGNOSIS — F331 Major depressive disorder, recurrent, moderate: Secondary | ICD-10-CM

## 2023-11-24 DIAGNOSIS — F411 Generalized anxiety disorder: Secondary | ICD-10-CM

## 2023-11-24 NOTE — Progress Notes (Signed)
 Virtual Visit via Video Note  I connected with Diane Pacheco on 11/24/23 at  9:00 AM EDT by a video enabled telemedicine application and verified that I am speaking with the correct person using two identifiers.  Location: Patient: home Provider: home office   I discussed the limitations of evaluation and management by telemedicine and the availability of in person appointments. The patient expressed understanding and agreed to proceed.  I discussed the assessment and treatment plan with the patient. The patient was provided an opportunity to ask questions and all were answered. The patient agreed with the plan and demonstrated an understanding of the instructions.   The patient was advised to call back or seek an in-person evaluation if the symptoms worsen or if the condition fails to improve as anticipated.  I provided 20 minutes of non-face-to-face time during this encounter.  THERAPIST PROGRESS NOTE  Session Time: 9:00 AM to 9:20 AM  Participation Level: Active  Behavioral Response: CasualAlertappropriate  Type of Therapy: Individual Therapy  Treatment Goals addressed: Work on anxiety, supportive interventions as caretaker that we will help her with stress in this role, emotional regulation progress  ProgressTowards Goals: Progressing-patient checked in patient using therapy for therapeutic purposes to work on issues  Interventions: Solution Focused, Strength-based, Supportive, and Other: Coping  Summary: Diane Pacheco is a 53 y.o. female who presents with shorter session today they are all doing good. Need to get Bear to the ENT. Checked in with patient no issues so shorter session. Therapist offered emotional support, active listening and validation of patient's emotional experience as appropriate during session.    Suicidal/Homicidal: No  Plan: Return again in 2 weeks.2.Work on anxiety, supportive interventions as caretaker that we will help her with stress in  this role, emotional regulation progress  Diagnosis: Major Depressive Disorder, recurrent, moderate (history of bipolar) Generalized Anxiety Disorder  Collaboration of Care: Other none needed  Patient/Guardian was advised Release of Information must be obtained prior to any record release in order to collaborate their care with an outside provider. Patient/Guardian was advised if they have not already done so to contact the registration department to sign all necessary forms in order for us  to release information regarding their care.   Consent: Patient/Guardian gives verbal consent for treatment and assignment of benefits for services provided during this visit. Patient/Guardian expressed understanding and agreed to proceed.   Ronal Sink, LCSW 11/24/2023

## 2023-12-06 ENCOUNTER — Ambulatory Visit (INDEPENDENT_AMBULATORY_CARE_PROVIDER_SITE_OTHER)

## 2023-12-06 ENCOUNTER — Other Ambulatory Visit: Payer: Self-pay

## 2023-12-06 VITALS — BP 132/72 | HR 83 | Wt 178.0 lb

## 2023-12-06 DIAGNOSIS — Z1211 Encounter for screening for malignant neoplasm of colon: Secondary | ICD-10-CM | POA: Diagnosis not present

## 2023-12-06 DIAGNOSIS — Z0000 Encounter for general adult medical examination without abnormal findings: Secondary | ICD-10-CM | POA: Diagnosis not present

## 2023-12-06 DIAGNOSIS — J449 Chronic obstructive pulmonary disease, unspecified: Secondary | ICD-10-CM

## 2023-12-06 DIAGNOSIS — K529 Noninfective gastroenteritis and colitis, unspecified: Secondary | ICD-10-CM

## 2023-12-06 MED ORDER — ALBUTEROL SULFATE HFA 108 (90 BASE) MCG/ACT IN AERS: 1.0000 | INHALATION_SPRAY | Freq: Four times a day (QID) | RESPIRATORY_TRACT | 2 refills | Status: AC | PRN

## 2023-12-06 MED ORDER — FLUTICASONE-SALMETEROL 45-21 MCG/ACT IN AERO: 2.0000 | INHALATION_SPRAY | Freq: Two times a day (BID) | RESPIRATORY_TRACT | 12 refills | Status: DC

## 2023-12-06 NOTE — Progress Notes (Signed)
    SUBJECTIVE:   CHIEF COMPLAINT / HPI:   Stomach issues Has loose stools for the last 4 years after her mother was placed on hospice. No abdominal pain or bleeding. She is having oily stools. She does drink two Mike[employer] hard lemonades per day. She also smokes 1 ppd. Had MRI pelvis in October 2024 with evidence of diverticulosis. No fevers, nausea, vomiting, abdominal pain. She is very gassy, and oftentimes, she will pass loose stool with this. She is urinating at her normal. No numbness or tingling around the pelvis. She does not like to drink water.  COPD Would like a nighttime medication that would not give her jitters. She has a chronic morning cough. She felt like the Spiriva  worked, it just made her jittery when she took it earlier in the day. She also needs refill of albuterol .  OBJECTIVE:   BP 132/72   Pulse 83   Wt 178 lb (80.7 kg)   LMP 12/21/2020   SpO2 98%   BMI 30.55 kg/m   General: Alert and oriented, in NAD Skin: Warm, dry, and intact without lesions HEENT: NCAT, EOM grossly normal, midline nasal septum Cardiac: RRR, no m/r/g appreciated Respiratory: Diminished breath sounds throughout, breathing and speaking comfortably on RA Abdominal: Soft, nontender, nondistended, normoactive bowel sounds Extremities: Moves all extremities grossly equally Neurological: No gross focal deficit Psychiatric: Appropriate mood and affect   ASSESSMENT/PLAN:   Assessment & Plan Chronic diarrhea Concern for malabsorptive etiology given oily stools. Less likely infectious given time course. Abdominal exam reassuring. We will further evaluate today for CBC, CMP, CRP, ESR, lipase, iron panel, celiac panel, and fecal calprotectin. Will also get her in with GI for colonoscopy and further evaluation. Chronic obstructive pulmonary disease, unspecified COPD type (HCC) Refilled albuterol . Also sent in Advair due to insurance preference at this time for maintenance. Reassuringly not in  exacerbation today. Healthcare maintenance She has MAW visit scheduled. Instructed to follow up with PCP for pap and vaccinations.   Stuart Redo, MD [facility]

## 2023-12-06 NOTE — Assessment & Plan Note (Signed)
 Refilled albuterol . Also sent in Advair due to insurance preference at this time for maintenance. Reassuringly not in exacerbation today.

## 2023-12-06 NOTE — Patient Instructions (Signed)
 We will collect labs today. I have sent in an inhaler for you.  I have sent in a referral for colonoscopy.  Be sure to follow up with Dr. Alba for your pap smear and vaccines.

## 2023-12-07 LAB — CBC WITH DIFFERENTIAL/PLATELET
Basophils Absolute: 0 x10E3/uL (ref 0.0–0.2)
Basos: 1 %
EOS (ABSOLUTE): 0.1 x10E3/uL (ref 0.0–0.4)
Eos: 2 %
Hematocrit: 40.1 % (ref 34.0–46.6)
Hemoglobin: 13.5 g/dL (ref 11.1–15.9)
Immature Grans (Abs): 0 x10E3/uL (ref 0.0–0.1)
Immature Granulocytes: 0 %
Lymphocytes Absolute: 2 x10E3/uL (ref 0.7–3.1)
Lymphs: 28 %
MCH: 37.7 pg — ABNORMAL HIGH (ref 26.6–33.0)
MCHC: 33.7 g/dL (ref 31.5–35.7)
MCV: 112 fL — ABNORMAL HIGH (ref 79–97)
Monocytes Absolute: 0.4 x10E3/uL (ref 0.1–0.9)
Monocytes: 6 %
Neutrophils Absolute: 4.4 x10E3/uL (ref 1.4–7.0)
Neutrophils: 63 %
Platelets: 221 x10E3/uL (ref 150–450)
RBC: 3.58 x10E6/uL — ABNORMAL LOW (ref 3.77–5.28)
RDW: 13.1 % (ref 11.7–15.4)
WBC: 7 x10E3/uL (ref 3.4–10.8)

## 2023-12-07 LAB — LIPASE: Lipase: 17 U/L (ref 14–72)

## 2023-12-07 LAB — COMPREHENSIVE METABOLIC PANEL WITH GFR
ALT: 32 IU/L (ref 0–32)
AST: 39 IU/L (ref 0–40)
Albumin: 4.3 g/dL (ref 3.9–4.9)
Alkaline Phosphatase: 87 IU/L (ref 44–121)
BUN/Creatinine Ratio: 7 — ABNORMAL LOW (ref 9–23)
BUN: 5 mg/dL — ABNORMAL LOW (ref 6–24)
Bilirubin Total: 0.2 mg/dL (ref 0.0–1.2)
CO2: 23 mmol/L (ref 20–29)
Calcium: 9.4 mg/dL (ref 8.7–10.2)
Chloride: 97 mmol/L (ref 96–106)
Creatinine, Ser: 0.67 mg/dL (ref 0.57–1.00)
Globulin, Total: 2.3 g/dL (ref 1.5–4.5)
Glucose: 94 mg/dL (ref 70–99)
Potassium: 4.6 mmol/L (ref 3.5–5.2)
Sodium: 137 mmol/L (ref 134–144)
Total Protein: 6.6 g/dL (ref 6.0–8.5)
eGFR: 106 mL/min/1.73 (ref >59)

## 2023-12-07 LAB — IRON,TIBC AND FERRITIN PANEL
Ferritin: 362 ng/mL — ABNORMAL HIGH (ref 15–150)
Iron Saturation: 28 % (ref 15–55)
Iron: 95 ug/dL (ref 27–159)
Total Iron Binding Capacity: 342 ug/dL (ref 250–450)
UIBC: 247 ug/dL (ref 131–425)

## 2023-12-07 LAB — C-REACTIVE PROTEIN: CRP: 3 mg/L (ref 0–10)

## 2023-12-07 LAB — SEDIMENTATION RATE: Sed Rate: 12 mm/h (ref 0–40)

## 2023-12-08 ENCOUNTER — Telehealth: Payer: Self-pay | Admitting: Family Medicine

## 2023-12-08 ENCOUNTER — Other Ambulatory Visit: Payer: Self-pay | Admitting: Family Medicine

## 2023-12-08 DIAGNOSIS — J439 Emphysema, unspecified: Secondary | ICD-10-CM

## 2023-12-08 DIAGNOSIS — K529 Noninfective gastroenteritis and colitis, unspecified: Secondary | ICD-10-CM | POA: Diagnosis not present

## 2023-12-09 ENCOUNTER — Ambulatory Visit: Payer: Self-pay | Admitting: Family Medicine

## 2023-12-09 DIAGNOSIS — K529 Noninfective gastroenteritis and colitis, unspecified: Secondary | ICD-10-CM

## 2023-12-09 MED ORDER — FLUTICASONE FUROATE-VILANTEROL 100-25 MCG/ACT IN AEPB: 1.0000 | INHALATION_SPRAY | Freq: Every day | RESPIRATORY_TRACT | 0 refills | Status: AC

## 2023-12-09 NOTE — Progress Notes (Signed)
 CBC with macrocytosis.  LFTs within normal limits.  Iron studies overall normal though with mildly elevated ferritin at 362.  ESR, CRP, lipase normal.  Concern remains for malabsorptive etiology of chronic diarrhea.  We are awaiting her celiac panel, vitamin B12, fecal fat, and fecal calprotectin. We are also awaiting her follow up with GI.  Green team: please call patient and help her get scheduled for a lab appointment for further blood testing and collection of stool samples.

## 2023-12-09 NOTE — Telephone Encounter (Signed)
Routed message to PCP. Jacorie Ernsberger, CMA  

## 2023-12-10 LAB — CALPROTECTIN, FECAL: Calprotectin, Fecal: <5 ug/g (ref 0–120)

## 2023-12-12 ENCOUNTER — Ambulatory Visit: Payer: Self-pay | Admitting: Family Medicine

## 2023-12-15 ENCOUNTER — Ambulatory Visit (HOSPITAL_COMMUNITY): Admitting: Licensed Clinical Social Worker

## 2023-12-15 DIAGNOSIS — F331 Major depressive disorder, recurrent, moderate: Secondary | ICD-10-CM | POA: Diagnosis not present

## 2023-12-15 DIAGNOSIS — F411 Generalized anxiety disorder: Secondary | ICD-10-CM | POA: Diagnosis not present

## 2023-12-15 NOTE — Progress Notes (Signed)
 Virtual Visit via Video Note  I connected with Diane Pacheco on 12/15/23 at  9:00 AM EDT by a video enabled telemedicine application and verified that I am speaking with the correct person using two identifiers.  Location: Patient: home Provider: home office   I discussed the limitations of evaluation and management by telemedicine and the availability of in person appointments. The patient expressed understanding and agreed to proceed.   I discussed the assessment and treatment plan with the patient. The patient was provided an opportunity to ask questions and all were answered. The patient agreed with the plan and demonstrated an understanding of the instructions.   The patient was advised to call back or seek an in-person evaluation if the symptoms worsen or if the condition fails to improve as anticipated.  I provided 45 minutes of non-face-to-face time during this encounter.  THERAPIST PROGRESS NOTE  Session Time:9:00 AM to 9:45 AM  Participation Level: Active  Behavioral Response: CasualAlertappropriate  Type of Therapy: Individual Therapy  Treatment Goals addressed:  Work on anxiety, supportive interventions as caretaker that we will help her with stress in this role, emotional regulation progress  ProgressTowards Goals: Progressing-therapy helpful for processing emotions safe outlet for clarity and release of emotion  Interventions: Solution Focused, Strength-based, Supportive, and Other: coping  Summary: Diane Pacheco is a 53 y.o. female who presents with not bad. Jamal is still a lot of work but not bad and dealing. The new trach didn't work as soon as in [place] said take it out. Just stick with what he has. Gets a lot of back pressure from air hard time getting passed the speaker valve allows air in but not out so air forced through windpipe and vocal cords. Problem is that windpipe swollen doesn't have a lot of room to go up the tube and out of the throat. Once  speaker valve out can breath but wants to talk. Good and bad days working out pretty good switch. A big thing is took care up switching over medical supply company 1 before was not good not reliable switched to [employer] so feels good that is taken care of it was hard process to switch.  Praise for healthcare representative who stayed on the phone with her for an hour and a half to help make it happen.   Pressure wash the house two sides of it. It is lovely weather out there. Beach coming up on the 7th start getting ready for that. Can't wait to see the baby get her for a night or two once or twice a month.  Talked about slowly being a caretaker Caroline-51 year old is getting ready to preschool. School starting so kids for [name] are not all in the house.  Aleck was jumping on the bed went to hit so he hit the baby. Patient says trying to get [name] when punish a kid not when you are mad. Mad begets mad. To do it when level headed and explain what is wrong. This is bad because you did this and this is going to happen. She is working on it hard jumping in with older kids not yours. Different when they are your kids not an issue when handled it from beginning didn't have an option they ruined before [name] never had consequences. They working on it don't think Elsie backs her up the way they should. She has a part-time job in evening get out of the house, get away from the house and work.  How has mood been? Heron is a little cranky mostly ok when men can't do what is used to irritated. Can't do the manly thing. Like outside work. He tries to be involved by telling her what to do leave her alone and let her do the work. Other than that alright.  Talked about other people in family Dad can't remember anything. Couple of years ago was working. Don't see often last time Father's Day. Hard to get away because taking care of Bear.  Repas asked if its better now how have things changed patient said not in the hospital every other  month like was so that is positive.  He gets tired with the snot gets so irritated gets to point where he says wish just died and never got radiation. Tell him glad he is here. Therapist pointed out patient handles it better and patient said life goes on, if gets over you change shirt get another one shirt. Her attitude helps deal with life stressors better why a good caretaker. He is now stuck on [employer]. His brother loved it picked on him now wants to call him and say this is good but brother has passed still watching the show makes him feel more connected. How she is doing cooking? Cooking b[place] butt.  Make homemade rub put in at 225 degree oven all day.  Talked about the baby all she does is smile happiest baby in the world nothing better then take her to grocery store.  Therapist knows the grocery store is patient's happy spot.     Therapy sessions help patient deal with stress of being a caretaker. It's a safe outlet for reflection and emotional release.  Therapist reviewed recent events symptoms particularly how things are going with trach, any major stressors in their life.  Therapist pointed out patient's good coping which again she knows why she is a good caretaker does not get bent out of shape about things takes them in stride as she is aware of these qualities as well in herself why good caregiver but also good for coping in life in general.  Reviewed any news with any of her relationships.  Talked about her daughter guiding her with parenting also enjoying getting the baby, looking at other aspects such as looking forward to the beach, cooking this weekend shows keeping them busy.  Therapist provided support and space for patient to talk about thoughts and feelings in session. Suicidal/Homicidal: No  Plan: Return again in 2 weeks.2.Work on anxiety, supportive interventions as caretaker that we will help her with stress in this role, emotional regulation progress   Diagnosis: Major Depressive  Disorder, recurrent, moderate (history of bipolar) Generalized Anxiety Disorder  Collaboration of Care: Other none needed  Patient/Guardian was advised Release of Information must be obtained prior to any record release in order to collaborate their care with an outside provider. Patient/Guardian was advised if they have not already done so to contact the registration department to sign all necessary forms in order for us  to release information regarding their care.   Consent: Patient/Guardian gives verbal consent for treatment and assignment of benefits for services provided during this visit. Patient/Guardian expressed understanding and agreed to proceed.   Ronal Sink, [place] 12/15/2023

## 2023-12-29 ENCOUNTER — Ambulatory Visit (HOSPITAL_COMMUNITY): Admitting: Licensed Clinical Social Worker

## 2023-12-29 DIAGNOSIS — F331 Major depressive disorder, recurrent, moderate: Secondary | ICD-10-CM

## 2023-12-29 DIAGNOSIS — F411 Generalized anxiety disorder: Secondary | ICD-10-CM | POA: Diagnosis not present
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
## 2024-01-12 ENCOUNTER — Ambulatory Visit (HOSPITAL_COMMUNITY): Admitting: Licensed Clinical Social Worker

## 2024-01-12 DIAGNOSIS — F331 Major depressive disorder, recurrent, moderate: Secondary | ICD-10-CM

## 2024-01-12 DIAGNOSIS — F411 Generalized anxiety disorder: Secondary | ICD-10-CM
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
## 2024-01-26 ENCOUNTER — Ambulatory Visit (HOSPITAL_COMMUNITY): Admitting: Licensed Clinical Social Worker

## 2024-02-09 ENCOUNTER — Ambulatory Visit (HOSPITAL_COMMUNITY): Admitting: Licensed Clinical Social Worker

## 2024-02-20 ENCOUNTER — Encounter: Payer: Self-pay | Admitting: Family Medicine

## 2024-02-23 ENCOUNTER — Ambulatory Visit (HOSPITAL_COMMUNITY): Admitting: Licensed Clinical Social Worker

## 2024-02-27 ENCOUNTER — Encounter

## 2024-03-02 ENCOUNTER — Ambulatory Visit: Admitting: Family Medicine

## 2024-03-02 VITALS — BP 135/84 | HR 87 | Ht 64.0 in | Wt 176.6 lb

## 2024-03-02 DIAGNOSIS — S39012A Strain of muscle, fascia and tendon of lower back, initial encounter: Secondary | ICD-10-CM

## 2024-03-02 MED ORDER — MELOXICAM 15 MG PO TABS: 15.0000 mg | ORAL_TABLET | Freq: Every day | ORAL | 0 refills | Status: AC

## 2024-03-02 MED ORDER — TIZANIDINE HCL 4 MG PO TABS: 4.0000 mg | ORAL_TABLET | Freq: Four times a day (QID) | ORAL | 0 refills | Status: DC | PRN

## 2024-03-02 NOTE — Progress Notes (Signed)
   SUBJECTIVE:   CHIEF COMPLAINT / HPI:  Discussed the use of AI scribe software for clinical note transcription with the patient, who gave verbal consent to proceed.  History of Present Illness Diane Pacheco is a 53 year old female who presents with severe lower back pain.  Lower back pain - Onset November 5th after bending down to pick up rice - Severe intensity, described as 'like hellfire', 'electricity', and 'zapped with a taser' - Pain localized to the lower back, without radiation to the toes - Pain intensified the following day, making it difficult to get out of bed without assistance - Partial improvement after a few days, but pain recurred after grocery shopping and moving her back to get out of her car - Pain causes her to cry and nearly fall during spasms  Associated neurological symptoms - No numbness or weakness in the legs - No incontinence or numbness in the genital area  Prior treatments and response - Heat, cold, lidocaine  patches, and Advil  provided no relief - Flexeril  was ineffective in the past - Has not used meloxicam or tizanidine  PERTINENT  PMH / PSH: no hx GI ulcers or bleeding disorder, no renal dysfunction  OBJECTIVE:  BP 135/84   Pulse 87   Ht 5' 4 (1.626 m)   Wt 176 lb 9.6 oz (80.1 kg)   LMP 12/21/2020   SpO2 98%   BMI 30.31 kg/m   Physical Exam GENERAL: Alert, cooperative, well developed, no acute distress CHEST: Breathing and speaking comfortably on RA EXTREMITIES: No cyanosis or edema MUSCULOSKELETAL: Lumbar paraspinal muscle tenderness on palpation, some thoracic paraspinal TTP as well NEUROLOGICAL: Cranial nerves grossly intact, moves all extremities without gross motor or sensory deficit, sensation intact throughout BLE, motor strength intact in lower extremities, gait guarded due to pain  ASSESSMENT/PLAN:   Assessment & Plan Strain of lumbar paraspinous muscle, initial encounter Acute lumbar paraspinal muscle strain with  severe pain, movement exacerbation, no signs of neurological involvement.  - Prescribed meloxicam 15 mg once daily x7 days for inflammation and pain. - Prescribed tizanidine for muscle relaxation, to be taken at bedtime Q6H prn spasms, cautioned on sedative effects - Advised Tylenol  for additional pain relief, avoid Advil  with meloxicam - Recommended continuation of lidocaine  patches for localized pain relief. - Provided home exercises for muscle movement and stretching, caution to avoid if pain exacerbation. - Instructed to call if no improvement in a few days or if symptoms worsen, such as weakness, numbness, or incontinence.  Stuart Redo, MD [facility]

## 2024-03-02 NOTE — Patient Instructions (Addendum)
 You have a muscle strain in the back. I have sent in tizanidine and meloxicam to help with this. Be aware of sedation with the tizanidine. Try these exercises below. Continue tylenol  and lidocaine  patches with this. Let me know if you are not improving or present to the ED if you have numbness, weakness of the legs/pelvic area or incontinence.  [email or web address]

## 2024-03-08 ENCOUNTER — Ambulatory Visit (HOSPITAL_COMMUNITY): Admitting: Licensed Clinical Social Worker

## 2024-03-20 ENCOUNTER — Ambulatory Visit (INDEPENDENT_AMBULATORY_CARE_PROVIDER_SITE_OTHER): Admitting: Licensed Clinical Social Worker

## 2024-03-20 DIAGNOSIS — F331 Major depressive disorder, recurrent, moderate: Secondary | ICD-10-CM | POA: Diagnosis not present

## 2024-03-20 DIAGNOSIS — F411 Generalized anxiety disorder: Secondary | ICD-10-CM | POA: Diagnosis not present

## 2024-03-20 NOTE — Progress Notes (Signed)
 Virtual Visit via Video Note  I connected with Diane Pacheco on 03/20/24 at  8:00 AM EST by a video enabled telemedicine application and verified that I am speaking with the correct person using two identifiers.  Location: Patient: home Provider: office   I discussed the limitations of evaluation and management by telemedicine and the availability of in person appointments. The patient expressed understanding and agreed to proceed.  I discussed the assessment and treatment plan with the patient. The patient was provided an opportunity to ask questions and all were answered. The patient agreed with the plan and demonstrated an understanding of the instructions.   The patient was advised to call back or seek an in-person evaluation if the symptoms worsen or if the condition fails to improve as anticipated.  I provided 44 minutes of non-face-to-face time during this encounter.  THERAPIST PROGRESS NOTE  Session Time: 8:00 AM to 8:44 AM  Participation Level: Active  Behavioral Response: CasualAlertappropriate  Type of Therapy: Individual Therapy  Treatment Goals addressed: work on anxiety, supportive interventions as caretaker that we will help her with stress in this role, emotional regulation skills, coping  ProgressTowards Goals: Progressing-reviewed treatment plan therapy helps patient address treatment goals such as stress of being a caretaker significant aspect of helping patient talked about recent events current stressors all helpful in managing mental health.  Patient gave consent to complete treatment plan virtually  Interventions: Solution Focused, Strength-based, Supportive, and Other: coping  Summary: Diane Pacheco is a 53 y.o. female who presents with checking in with therapist given haven't seen each other for awhile due to issues insurance paying until she was seen in person but that policies updated now the government is open.  She related called 988 twice  and therapist asked why? When she and Bear start fussing and can verbalize helps. Introduced [place], grandbaby.  She was smiling and happy at therapist therapist said what a lovely baby. She gets to cheer patient up. She is sweet and makes patient happy. Can be fussy snuffy noise teething can be fussy. Patient is ok little stressed but alright. Mostly bear the source of her stress.  Stress level about the same but very frustrated when he tells her she needs to quit smoking he stopped so now it is the most evil thing in the world. He smoked for 40 years. Therapist asks how she responds she just says ok.    Patient pointed out government sucks and therapist agrees. Talked about affecting seeing therapist wanted patient to see in person and patient said some people don't have that luxury. Therapist noted actually virtual allows people to have access to mental health. Patient explains why needs to be there Heron has panic attacks and his care is 24-7. Went to [facility] around [place] beginning of October and going back for 4 days to see the Christmas shows. In April the beach Mom is planning a [place]  trip go to place like [employer]. Patient says it is a long trip to [place] . Patient said Heron was able to ride roller coaster has lost a lot of weight. They had a lot of fun. 5-6 rides so worth it.  Therapist feedback was happy to hear that he can enjoy life more things he can still enjoy life despite some of the drawbacks of having the trach. How has his health been? He has had two eye surgeries old cataract lens slip out of place and floating in eyeballs. Went to community education officer and did surgery immediately.  They got out the lens a new lens, tabs sewed in place so attach better one stitch ready to come out. Another problem with iris went to other eye doctor. Something about how fluid flows around eye and fluid making eye pushed against lens if not fixed glaucoma. They used laser cut iris to open up. Never seen suck out,  usually suck in 4 doctors there so rare.  They are pros at his throat take off his thing and blew out snot cleaned out and put it back in at stoplight.  Therapist glad to hear the progress helps with the stress.  Bear better with that  every day getting used to fact will be a lot of snot.   [place] coming up the day after Christmas all getting together at Zach's bringing his new girlfriend. Nobody hears from him. Sister and her husband coming, Brant and Ozell Duncan family is coming Zach's wife. Going to make pumpkin cookie, sausage balls, chess bars. Didn't know if panic attack almost went to the hospital stress of the day, Thanksgiving Day, cleaning for days before chest hurting. Figured a panic attack.   Therapist asked what she is doing regularly keeping herself busy but brings her joy these things keep her busy and also bring her joy cooking twice a month, baby twice a month and watching Bear. All bring her joy.  Therapist wondered with her watching now watching watching [place] all over again.  Therapist and patient sharing that I commented enjoying different things on television.  In general therapist processed thoughts and feelings in session, relationship therapist assesses as well helpful for coping with mental health..    Suicidal/Homicidal: No  Plan: Return again in 4 weeks.2,Work on anxiety, supportive interventions as caretaker that we will help her with stress in this role, work on emotional regulation skills  Diagnosis: Major Depressive Disorder, recurrent, moderate (history of bipolar) Generalized Anxiety Disorder  Collaboration of Care: Other none needed  Patient/Guardian was advised Release of Information must be obtained prior to any record release in order to collaborate their care with an outside provider. Patient/Guardian was advised if they have not already done so to contact the registration department to sign all necessary forms in order for us  to release information regarding their  care.   Consent: Patient/Guardian gives verbal consent for treatment and assignment of benefits for services provided during this visit. Patient/Guardian expressed understanding and agreed to proceed.   Ronal Sink, LCSW 03/20/2024

## 2024-03-29 ENCOUNTER — Ambulatory Visit

## 2024-03-29 VITALS — Ht 64.0 in | Wt 165.0 lb

## 2024-03-29 DIAGNOSIS — Z0000 Encounter for general adult medical examination without abnormal findings: Secondary | ICD-10-CM

## 2024-03-29 NOTE — Progress Notes (Signed)
 Chief Complaint  Patient presents with   Medicare Wellness    SUBSEQUENT     Subjective:   Diane Pacheco is a 53 y.o. female who presents for a Medicare Annual Wellness Visit.  Visit info / Clinical Intake: Medicare Wellness Visit Type:: Subsequent Annual Wellness Visit Persons participating in visit and providing information:: patient Medicare Wellness Visit Mode:: Telephone If telephone:: video declined Since this visit was completed virtually, some vitals may be partially provided or unavailable. Missing vitals are due to the limitations of the virtual format.: Documented vitals are patient reported If Telephone or Video please confirm:: I connected with patient using audio/video enable telemedicine. I verified patient identity with two identifiers, discussed telehealth limitations, and patient agreed to proceed. Patient Location:: HOME Provider Location:: HOME OFFICE Interpreter Needed?: No Pre-visit prep was completed: yes AWV questionnaire completed by patient prior to visit?: no Living arrangements:: (!) lives alone Patient's Overall Health Status Rating: (!) fair Typical amount of pain: some Does pain affect daily life?: no Are you currently prescribed opioids?: no  Dietary Habits and Nutritional Risks How many meals a day?: 3 Eats fruit and vegetables daily?: yes Most meals are obtained by: preparing own meals In the last 2 weeks, have you had any of the following?: none Diabetic:: no  Functional Status Activities of Daily Living (to include ambulation/medication): Independent Ambulation: Independent Medication Administration: Independent Home Management (perform basic housework or laundry): Independent Manage your own finances?: yes Primary transportation is: driving Concerns about vision?: no *vision screening is required for WTM* Concerns about hearing?: no  Fall Screening Falls in the past year?: 0 Number of falls in past year: 0 Was there an  injury with Fall?: 0 Fall Risk Category Calculator: 0 Patient Fall Risk Level: Low Fall Risk  Fall Risk Patient at Risk for Falls Due to: No Fall Risks Fall risk Follow up: Falls evaluation completed; Education provided  Home and Transportation Safety: All rugs have non-skid backing?: N/A, no rugs All stairs or steps have railings?: N/A, no stairs Grab bars in the bathtub or shower?: yes Have non-skid surface in bathtub or shower?: yes Good home lighting?: yes Regular seat belt use?: yes Hospital stays in the last year:: no  Cognitive Assessment Difficulty concentrating, remembering, or making decisions? : no (SON HELPS WITH FINANCES, ORGANIZED) Will 6CIT or Mini Cog be Completed: yes (BSE: READING, GAMES ON PHONE) What year is it?: 0 points What month is it?: 0 points Give patient an address phrase to remember (5 components): [place] [address] About what time is it?: 0 points Count backwards from 20 to 1: 0 points Say the months of the year in reverse: 0 points Repeat the address phrase from earlier: 0 points 6 CIT Score: 0 points  Advance Directives (For Healthcare) Does Patient Have a Medical Advance Directive?: No Would patient like information on creating a medical advance directive?: No - Patient declined  Reviewed/Updated  Reviewed/Updated: Reviewed All (Medical, Surgical, Family, Medications, Allergies, Care Teams, Patient Goals)    Allergies (verified) Patient has no known allergies.   Current Medications (verified) Outpatient Encounter Medications as of 03/29/2024  Medication Sig   albuterol  (VENTOLIN  HFA) 108 (90 Base) MCG/ACT inhaler Inhale 1-2 puffs into the lungs every 6 (six) hours as needed for wheezing or shortness of breath. INHALE ONE TO TWO PUFFS BY MOUTH EVERY 6 HOURS AS NEEDED FOR WHEEZING AND FOR SHORTNESS OF BREATH   cephALEXin  (KEFLEX ) 500 MG capsule Take 1 capsule (500 mg total)  by mouth 2 (two) times daily. (Patient not taking: Reported  on 12/06/2023)   chlorhexidine  (HIBICLENS ) 4 % external liquid Apply topically daily as needed. (Patient not taking: Reported on 12/06/2023)   fluticasone  (FLONASE ) 50 MCG/ACT nasal spray Place 2 sprays into both nostrils daily. (Patient not taking: Reported on 12/06/2023)   fluticasone  furoate-vilanterol (BREO ELLIPTA ) 100-25 MCG/ACT AEPB Inhale 1 puff into the lungs daily.   mupirocin  ointment (BACTROBAN ) 2 % Apply 1 Application topically 2 (two) times daily. (Patient not taking: Reported on 12/06/2023)   tiZANidine  (ZANAFLEX ) 4 MG tablet Take 1 tablet (4 mg total) by mouth every 6 (six) hours as needed for muscle spasms.   No facility-administered encounter medications on file as of 03/29/2024.    History: Past Medical History:  Diagnosis Date   Asthma    exacerbations w/ respiratory infxns. Well controlled w/ abuterol PRN   Condyloma acuminatum    COPD (chronic obstructive pulmonary disease) (HCC)    Major depression    Unable to take antidepressents due to intolerance. Prefers to handle w/o Rx   Past Surgical History:  Procedure Laterality Date   HEMORRHOID SURGERY  2020   unsure of exact year   TUBAL LIGATION     Family History  Problem Relation Age of Onset   Breast cancer Mother    Cancer Mother        breast cancer at 4yo   Heart attack Mother        late 26s    COPD Mother    Dementia Father    Breast cancer Maternal Grandmother        around 51yo   Breast cancer Paternal Grandmother        around 90yo   Social History   Occupational History   Occupation: disabled  Tobacco Use   Smoking status: Every Day    Current packs/day: 1.00    Types: Cigarettes    Passive exposure: Current   Smokeless tobacco: Never   Tobacco comments:    Started smoking at age 31  Vaping Use   Vaping status: Never Used  Substance and Sexual Activity   Alcohol use: Yes    Alcohol/week: 4.0 standard drinks of alcohol    Types: 4 Cans of beer per week    Comment: weekly weekends  per pt, hard lemonade   Drug use: Yes    Types: Marijuana    Comment: pt states daily use   Sexual activity: Not Currently    Birth control/protection: Surgical   Tobacco Counseling Ready to quit: Not Answered Counseling given: Not Answered Tobacco comments: Started smoking at age 38  SDOH Screenings   Food Insecurity: No Food Insecurity (03/29/2024)  Housing: Low Risk (03/29/2024)  Transportation Needs: No Transportation Needs (03/29/2024)  Utilities: Not At Risk (03/29/2024)  Alcohol Screen: Low Risk (03/29/2024)  Depression (PHQ2-9): Low Risk (03/29/2024)  Financial Resource Strain: Low Risk (03/29/2024)  Physical Activity: Inactive (03/29/2024)  Social Connections: Socially Isolated (03/29/2024)  Stress: No Stress Concern Present (03/29/2024)  Tobacco Use: High Risk (03/29/2024)  Health Literacy: Adequate Health Literacy (03/29/2024)   See flowsheets for full screening details  Depression Screen PHQ 2 & 9 Depression Scale- Over the past 2 weeks, how often have you been bothered by any of the following problems? Little interest or pleasure in doing things: 0 Feeling down, depressed, or hopeless (PHQ Adolescent also includes...irritable): 1 PHQ-2 Total Score: 1 Trouble falling or staying asleep, or sleeping too much: 0 Feeling tired or having little  energy: 0 Poor appetite or overeating (PHQ Adolescent also includes...weight loss): 0 Feeling bad about yourself - or that you are a failure or have let yourself or your family down: 0 Trouble concentrating on things, such as reading the newspaper or watching television (PHQ Adolescent also includes...like school work): 0 Moving or speaking so slowly that other people could have noticed. Or the opposite - being so fidgety or restless that you have been moving around a lot more than usual: 0 Thoughts that you would be better off dead, or of hurting yourself in some way: 0 PHQ-9 Total Score: 1 If you checked off any problems, how  difficult have these problems made it for you to do your work, take care of things at home, or get along with other people?: Not difficult at all     Goals Addressed             This Visit's Progress    Quit Smoking       Quit Smoking Goals: Manage cravings and triggers by practicing mindfulness and relaxation techniques. Create a personalized quit plan. Enlist support from friends and family. Reward yourself for accomplishments. Find alternative activities. Make healthier choices. Prepare for difficult situations. Identify reasons to quit.             Objective:    Today's Vitals   03/29/24 0954  Weight: 165 lb (74.8 kg)  Height: 5' 4 (1.626 m)  PainSc: 0-No pain   Body mass index is 28.32 kg/m.  Hearing/Vision screen No results found. Immunizations and Health Maintenance Health Maintenance  Topic Date Due   Hepatitis B Vaccines 19-59 Average Risk (1 of 3 - 19+ 3-dose series) Never done   Colonoscopy  Never done   Cervical Cancer Screening (HPV/Pap Cotest)  04/27/2021   Zoster Vaccines- Shingrix (1 of 2) Never done   Influenza Vaccine  11/18/2023   Mammogram  12/13/2023   COVID-19 Vaccine (4 - 2025-26 season) 12/19/2023   Medicare Annual Wellness (AWV)  03/29/2025   DTaP/Tdap/Td (3 - Td or Tdap) 06/17/2027   Pneumococcal Vaccine: 50+ Years  Completed   Hepatitis C Screening  Completed   HIV Screening  Completed   HPV VACCINES  Aged Out   Meningococcal B Vaccine  Aged Out        Assessment/Plan:  This is a routine wellness examination for [facility].  Patient Care Team: Alba Sharper, MD as PCP - General (Family Medicine) Alvan Ronal BRAVO, MD (Inactive) as PCP - Cardiology (Cardiology) Cleatus Collar, MD as Consulting Physician (Ophthalmology) Saintclair Jasper, MD as Consulting Physician (Gastroenterology) Sheldon Standing, MD as Consulting Physician (General Surgery)  I have personally reviewed and noted the following in the patients chart:   Medical  and social history Use of alcohol, tobacco or illicit drugs  Current medications and supplements including opioid prescriptions. Functional ability and status Nutritional status Physical activity Advanced directives List of other physicians Hospitalizations, surgeries, and ER visits in previous 12 months Vitals Screenings to include cognitive, depression, and falls Referrals and appointments  No orders of the defined types were placed in this encounter.  In addition, I have reviewed and discussed with patient certain preventive protocols, quality metrics, and best practice recommendations. A written personalized care plan for preventive services as well as general preventive health recommendations were provided to patient.   Roz LOISE Fuller, LPN   [id number]   Return in 1 year (on 03/29/2025).  After Visit Summary: (MyChart) Due to this being a telephonic visit,  the after visit summary with patients personalized plan was offered to patient via MyChart   Nurse Notes: Last office visit was 03/04/2023 HM Addressed: Vaccines Due: Flu, Covid-19, Hep B and Shingrix. Patient is due for Cervical Cancer Screening, Mammogram and Colonoscopy.

## 2024-03-29 NOTE — Patient Instructions (Signed)
 Ms. Longie,  Thank you for taking the time for your Medicare Wellness Visit. I appreciate your continued commitment to your health goals. Please review the care plan we discussed, and feel free to reach out if I can assist you further.  Please note that Annual Wellness Visits do not include a physical exam. Some assessments may be limited, especially if the visit was conducted virtually. If needed, we may recommend an in-person follow-up with your provider.  Ongoing Care Seeing your primary care provider every 3 to 6 months helps us  monitor your health and provide consistent, personalized care.   Referrals If a referral was made during today's visit and you haven't received any updates within two weeks, please contact the referred provider directly to check on the status.  Recommended Screenings:  Health Maintenance  Topic Date Due   Hepatitis B Vaccine (1 of 3 - 19+ 3-dose series) Never done   Colon Cancer Screening  Never done   Pap with HPV screening  04/27/2021   Zoster (Shingles) Vaccine (1 of 2) Never done   Flu Shot  11/18/2023   Medicare Annual Wellness Visit  11/26/2023   Breast Cancer Screening  12/13/2023   COVID-19 Vaccine (4 - 2025-26 season) 12/19/2023   DTaP/Tdap/Td vaccine (3 - Td or Tdap) 06/17/2027   Pneumococcal Vaccine for age over 38  Completed   Hepatitis C Screening  Completed   HIV Screening  Completed   HPV Vaccine  Aged Out   Meningitis B Vaccine  Aged Out       03/29/2024    9:55 AM  Advanced Directives  Does Patient Have a Medical Advance Directive? No  Would patient like information on creating a medical advance directive? No - Patient declined    Vision: Annual vision screenings are recommended for early detection of glaucoma, cataracts, and diabetic retinopathy. These exams can also reveal signs of chronic conditions such as diabetes and high blood pressure.  Dental: Annual dental screenings help detect early signs of oral cancer, gum  disease, and other conditions linked to overall health, including heart disease and diabetes.  Please see the attached documents for additional preventive care recommendations.

## 2024-04-16 ENCOUNTER — Ambulatory Visit (INDEPENDENT_AMBULATORY_CARE_PROVIDER_SITE_OTHER): Admitting: Licensed Clinical Social Worker

## 2024-04-16 DIAGNOSIS — F331 Major depressive disorder, recurrent, moderate: Secondary | ICD-10-CM | POA: Diagnosis not present

## 2024-04-16 DIAGNOSIS — F411 Generalized anxiety disorder: Secondary | ICD-10-CM | POA: Diagnosis not present

## 2024-04-16 NOTE — Progress Notes (Signed)
 Virtual Visit via Video Note  I connected with Mitzie KATHEE Alas on 04/16/2024 at 11:00 AM EST by a video enabled telemedicine application and verified that I am speaking with the correct person using two identifiers.  Location: Patient: home Provider: home office   I discussed the limitations of evaluation and management by telemedicine and the availability of in person appointments. The patient expressed understanding and agreed to proceed.  I discussed the assessment and treatment plan with the patient. The patient was provided an opportunity to ask questions and all were answered. The patient agreed with the plan and demonstrated an understanding of the instructions.   The patient was advised to call back or seek an in-person evaluation if the symptoms worsen or if the condition fails to improve as anticipated.  I provided 46 minutes of non-face-to-face time during this encounter.  THERAPIST PROGRESS NOTE  Session Time: 11:00 AM to 11:46 AM  Participation Level: Active  Behavioral Response: CasualAlertappropriate  Type of Therapy: Individual Therapy  Treatment Goals addressed: work on anxiety, supportive interventions as caretaker that we will help her with stress in this role, emotional regulation skills, coping  ProgressTowards Goals: Progressing-Therapist assesses patient uses therapy as an emotional regulation tool along with therapist using supportive and strength based intervention.   Interventions: Solution Focused, Strength-based, Supportive, and Other: coping  Summary: VINCENT EHRLER is a 53 y.o. female who presents therapist asking how Christmas was? Patient said cooked five days straight Bear and Mom wanted candy had to make hard candy and peanut brittle. Made chest bars, pumpkin balls with caramel icing sausage balls took to family reunion. For her and Heron made prime rib. Busy forgot to make Yorkshire pudding. Took a meat slicer make steak subs. Put a bone in  with meat and make soup beef soup. Patient says with them know how to not waste it and have to the way prices are.    Took things to family get together the day after Christmas. A lot of people [name] came down so can see Joshua, Braylen (Cory's daughter) there not getting along with her Mom. She asked Mom ([name] to come back). The problem is that mom has a taste for freedom retired life without taking care of kids. Braylen is 13/14 Mom wants to take care of herself don't know how old before Braylen can decide where she wants to be. Her mom knows Darleene died money goes to her whoever gets her gets her tree surgeon money.  Talked about what is going on in her life. Heron is taking care of trach lets her sleep as much as possible wait for her to get up and get things, breakfast. She lays down down until 8 get back up and then he lays down.  Therapist knows patient does a lot to take care of bear so positive that he has taken more of the trach over to take care of Therapist asked what cooking for New Year's pork collard green black eye peas don't like the black eye so will be sauerkraut. Heron has torn rotator cuffs on both shoulders he refuses to have it looked at guess that means he plans never raise arms again. Arthea had to have shoulder done again because of work he does. There is so much physical therapy after getting shoulder worked on or it will freeze ridiculous PT hurts after surgery. Bear had four back surgeries and trach had enough all he can do. Therapist asked she is doing hip and  back acting up not bad. Cold and cough smoking too much most of it because of all the cooking and cleaning make her smoke more. Therapist asked if Heron still trying to get her to stop. Patient says yes and he asked all high in 83 when he smoked for 40 years. She will quit when ready just irritates her. Actually wants to make her smoke more does out of spite. Poke and poke not a good way to get her to change.   Therapist agrees that this fundamentals of motivation have to work on internal motivation        Precious and Bo met the baby Larry his husband there. Provided support and space for patient to talk about thoughts and feelings in session therapist providing supportive counseling and active listening    Suicidal/Homicidal: No  Plan: Return again in 05/29/24 therapist on vacation.  His thoughts and feelings in session help with coping  Diagnosis: Major Depressive Disorder, recurrent, moderate (history of bipolar) Generalized Anxiety Disorder  Collaboration of Care: Other none needed  Patient/Guardian was advised Release of Information must be obtained prior to any record release in order to collaborate their care with an outside provider. Patient/Guardian was advised if they have not already done so to contact the registration department to sign all necessary forms in order for us  to release information regarding their care.   Consent: Patient/Guardian gives verbal consent for treatment and assignment of benefits for services provided during this visit. Patient/Guardian expressed understanding and agreed to proceed.   Ronal Sink, LCSW 04/16/2024

## 2024-04-30 ENCOUNTER — Other Ambulatory Visit (HOSPITAL_COMMUNITY)
Admission: RE | Admit: 2024-04-30 | Discharge: 2024-04-30 | Disposition: A | Source: Ambulatory Visit | Attending: Family Medicine | Admitting: Family Medicine

## 2024-04-30 ENCOUNTER — Ambulatory Visit: Payer: Self-pay | Admitting: Family Medicine

## 2024-04-30 ENCOUNTER — Encounter: Payer: Self-pay | Admitting: Family Medicine

## 2024-04-30 VITALS — BP 125/85 | HR 89 | Ht 64.0 in | Wt 171.5 lb

## 2024-04-30 DIAGNOSIS — K529 Noninfective gastroenteritis and colitis, unspecified: Secondary | ICD-10-CM

## 2024-04-30 DIAGNOSIS — R030 Elevated blood-pressure reading, without diagnosis of hypertension: Secondary | ICD-10-CM | POA: Diagnosis not present

## 2024-04-30 DIAGNOSIS — Z1151 Encounter for screening for human papillomavirus (HPV): Secondary | ICD-10-CM | POA: Insufficient documentation

## 2024-04-30 DIAGNOSIS — Z124 Encounter for screening for malignant neoplasm of cervix: Secondary | ICD-10-CM | POA: Insufficient documentation

## 2024-04-30 DIAGNOSIS — Z01419 Encounter for gynecological examination (general) (routine) without abnormal findings: Secondary | ICD-10-CM | POA: Diagnosis not present

## 2024-04-30 DIAGNOSIS — R300 Dysuria: Secondary | ICD-10-CM | POA: Diagnosis present

## 2024-04-30 DIAGNOSIS — Z23 Encounter for immunization: Secondary | ICD-10-CM

## 2024-04-30 DIAGNOSIS — Z1231 Encounter for screening mammogram for malignant neoplasm of breast: Secondary | ICD-10-CM

## 2024-04-30 DIAGNOSIS — Z1211 Encounter for screening for malignant neoplasm of colon: Secondary | ICD-10-CM | POA: Diagnosis not present

## 2024-04-30 LAB — POCT URINALYSIS DIP (MANUAL ENTRY)
Bilirubin, UA: NEGATIVE
Glucose, UA: NEGATIVE mg/dL
Ketones, POC UA: NEGATIVE mg/dL
Nitrite, UA: POSITIVE — AB
Protein Ur, POC: 100 mg/dL — AB
Spec Grav, UA: 1.02
Urobilinogen, UA: 0.2 U/dL
pH, UA: 7

## 2024-04-30 LAB — POCT UA - MICROSCOPIC ONLY
RBC, Urine, Miroscopic: >20
WBC, Ur, HPF, POC: >20

## 2024-04-30 MED ORDER — CEFDINIR 300 MG PO CAPS: 300.0000 mg | ORAL_CAPSULE | Freq: Two times a day (BID) | ORAL | 0 refills | Status: AC

## 2024-04-30 NOTE — Progress Notes (Signed)
" ° ° °  SUBJECTIVE:   CHIEF COMPLAINT / HPI: address care caps  Discussed the use of AI scribe software for clinical note transcription with the patient, who gave verbal consent to proceed.  History of Present Illness Diane Pacheco is a 54 year old female who presents with urinary pain and frequent urination.  Dysuria - frequent urethral pain since last night - has dysuria and frequency - Urinary frequency with need to urinate approximately every 10 minutes since onset - Passes only small amounts of urine each time - Urine described as bubbly  Associated and negative symptoms - No fever - No abdominal pain - No respiratory symptoms  Relevant history - No prior history of urinary tract infection - Daughter has history of urinary tract infection  - No vaginal discharge or itching  PERTINENT  PMH / PSH: COPD, Fatty liver, Tobacco use, Fhx of Heart disease  OBJECTIVE:   BP 125/85   Pulse 89   Ht 5' 4 (1.626 m)   Wt 171 lb 8 oz (77.8 kg)   LMP 12/21/2020   SpO2 97%   BMI 29.44 kg/m   Physical Exam General: NAD, well appearing Neuro: A&O Cardiovascular: RRR, no murmurs,  Respiratory: normal WOB on RA, CTAB, no wheezes, ronchi or rales Abdomen: soft, NTTP, no rebound or guarding Extremities: Moving all 4 extremities equally, no peripheral edema    ASSESSMENT/PLAN:   Assessment & Plan Dysuria Clinically consistent with UTI, urinalysis ordered.  Given high likelihood ratio will treat empirically. - Cefdinir  300 mg twice daily for 7 days - UA positive for nitrite and leukocytes Elevated blood pressure reading Elevated today in clinic. Repeat normal. - Recommended to measure at least 3 times per week - Bring in blood pressure log in 2 to 3 weeks for follow-up Encounter for screening mammogram for malignant neoplasm of breast Mammogram ordered today Colon cancer screening Chronic diarrhea Previously referred to gastroenterology by Dr. Tharon for chronic  diarrhea.  Given need for colonoscopy, as well will rerefer.  Additionally provided with phone number of previous referral placed. Cervical cancer screening Pap smear performed today.  Previous in 2018 negative with negative cotesting. Encounter for immunization Flu and covid vaccines administered today.  Precepted with Dr. Teressa  Return in about 3 months (around 07/29/2024).  Ozell Provencal, MD, PGY-3 [facility] 10:23 AM 04/30/2024  [facility]   "

## 2024-04-30 NOTE — Patient Instructions (Addendum)
 It was great to see you! Thank you for allowing me to participate in your care!  [facility] [address], [place], [place] [address] Phone: [phone]  Our plans for today:   VISIT SUMMARY: Today, you were seen for urinary pain and frequent urination. We discussed your symptoms and provided treatment for a urinary tract infection. We also addressed your chronic diarrhea and slightly elevated blood pressure, and discussed general health maintenance.  YOUR PLAN: URINARY TRACT INFECTION: Your symptoms are consistent with a urinary tract infection. -You have been prescribed cefdinir  to take twice daily for 7 days. -Be aware that your stool may appear red, which is not a major side effect. -If your urine culture is negative, we will send it to [facility] for further bacterial growth analysis.  CHRONIC DIARRHEA: You have frequent bowel movements and previous abnormal lab results. -You have been re-referred to a GI specialist for further evaluation and to discuss a colonoscopy. -I have provided you with the GI specialist's contact information.  ELEVATED BLOOD PRESSURE: Your blood pressure was slightly elevated today, though you do not have a history of hypertension. -We rechecked your blood pressure before you left. -You should monitor your blood pressure at home or at a pharmacy three times a week. -Please record your readings and bring them in 2-3 weeks for evaluation.  GENERAL HEALTH MAINTENANCE: We discussed your general health maintenance needs. -You are due for a mammogram, which has been ordered. -You plan to get flu and COVID vaccines, and I have provided you with information on these vaccines.     Please arrive 15 minutes PRIOR to your next scheduled appointment time! If you do not, this affects OTHER patients' care.  Take care and seek immediate care sooner if you develop any concerns.   Ozell Provencal, MD, PGY-3 [facility] 9:28 AM 04/30/2024  [facility]

## 2024-05-02 LAB — CYTOLOGY - PAP
Adequacy: ABSENT
Comment: NEGATIVE
Diagnosis: NEGATIVE
Diagnosis: REACTIVE
High risk HPV: NEGATIVE

## 2024-05-03 ENCOUNTER — Ambulatory Visit
Admission: RE | Admit: 2024-05-03 | Discharge: 2024-05-03 | Disposition: A | Discharge status: Home / Self Care | Source: Ambulatory Visit | Attending: Emergency Medicine | Admitting: Emergency Medicine

## 2024-05-03 ENCOUNTER — Other Ambulatory Visit: Payer: Self-pay

## 2024-05-03 VITALS — BP 131/80 | HR 95 | Temp 100.4°F | Resp 22

## 2024-05-03 DIAGNOSIS — B349 Viral infection, unspecified: Secondary | ICD-10-CM

## 2024-05-03 DIAGNOSIS — R509 Fever, unspecified: Secondary | ICD-10-CM

## 2024-05-03 DIAGNOSIS — J441 Chronic obstructive pulmonary disease with (acute) exacerbation: Secondary | ICD-10-CM

## 2024-05-03 LAB — POC COVID19/FLU A&B COMBO
Covid Antigen, POC: NEGATIVE
Influenza A Antigen, POC: NEGATIVE
Influenza B Antigen, POC: NEGATIVE

## 2024-05-03 MED ORDER — PREDNISONE 10 MG (21) PO TBPK: ORAL_TABLET | Freq: Every day | ORAL | 0 refills | Status: AC

## 2024-05-03 MED ORDER — GUAIFENESIN-CODEINE 100-10 MG/5ML PO SOLN: 5.0000 mL | Freq: Four times a day (QID) | ORAL | 0 refills | Status: AC | PRN

## 2024-05-03 MED ORDER — BENZONATATE 100 MG PO CAPS: 100.0000 mg | ORAL_CAPSULE | Freq: Three times a day (TID) | ORAL | 0 refills | Status: AC

## 2024-05-03 MED ORDER — METHYLPREDNISOLONE SODIUM SUCC 125 MG IJ SOLR
60.0000 mg | Freq: Once | INTRAMUSCULAR | Status: AC
  Administered 2024-05-03: 60 mg via INTRAMUSCULAR

## 2024-05-03 NOTE — ED Triage Notes (Addendum)
 PT reports SHOB started on Tuesday . Pt had a PCP appt. On Monday and was given a COVID and FLU at the PCP appt. Pt has COPD and still smokes. Pt Used her husbands Nebulizer to giver self a Deuo neb treatment. Pr has a cough that is worse since Monday .SPO2 during triage 89 to 93 after she coughed. PT has pain in her neck from coughing for days.

## 2024-05-03 NOTE — Discharge Instructions (Signed)
 He had a fever today in the clinic at 100.4 which is an indication of a germ, your symptoms today are most likely being caused by a virus and should steadily improve in time it can take up to 7 to 10 days before you truly start to see a turnaround however things will get better  COVID and flu test negative  Do believe the virus is flaring your COPD and you have been given an injection of steroids to open and relax the airway and should see improvement within the next 30 minutes  Start tomorrow take oral prednisone  as directed, continue use of inhaler as needed throughout the day  You may use Tessalon  pill every 8 hours for coughing and you may use cough syrup with codeine  every 6 hours but please be mindful can make you drowsy      You can take Tylenol  and/or Ibuprofen  as needed for fever reduction and pain relief.   For cough: honey 1/2 to 1 teaspoon (you can dilute the honey in water or another fluid).  You can also use guaifenesin  and dextromethorphan for cough. You can use a humidifier for chest congestion and cough.  If you don't have a humidifier, you can sit in the bathroom with the hot shower running.      For sore throat: try warm salt water gargles, cepacol lozenges, throat spray, warm tea or water with lemon/honey, popsicles or ice, or OTC cold relief medicine for throat discomfort.   For congestion: take a daily anti-histamine like Zyrtec , Claritin, and a oral decongestant, such as pseudoephedrine.  You can also use Flonase  1-2 sprays in each nostril daily.   It is important to stay hydrated: drink plenty of fluids (water, gatorade/powerade/pedialyte, juices, or teas) to keep your throat moisturized and help further relieve irritation/discomfort.

## 2024-05-03 NOTE — ED Provider Notes (Signed)
 " [facility]    CSN: [id number] Arrival date & time: 05/03/24  1344      History   Chief Complaint Chief Complaint  Patient presents with   Cough    Chest tightness - Entered by patient   Shortness of Breath    HPI Diane Pacheco is a 54 y.o. female.   Patient presents for evaluation of postnasal drip, productive cough with thick green sputum, chest tightness and shortness of breath at rest present for 2 days.  Symptoms exacerbated by exertion and when lying flat.  Has attempted use of DuoNeb.  Received COVID and flu vaccine 2 to 3 days ago.  Decreased appetite but tolerable to some food and liquids.  No known sick contacts prior.  Denies presence of wheezing.  History of COPD, daily tobacco use.    Past Medical History:  Diagnosis Date   Asthma    exacerbations w/ respiratory infxns. Well controlled w/ abuterol PRN   Condyloma acuminatum    COPD (chronic obstructive pulmonary disease) (HCC)    Major depression    Unable to take antidepressents due to intolerance. Prefers to handle w/o Rx    Patient Active Problem List   Diagnosis Date Noted   Numbness and tingling in left arm 01/26/2022   Fatty liver 02/04/2020   Right upper quadrant pain 01/25/2020   Muscle strain 12/10/2019   Caregiver stress syndrome 06/15/2019   Fissure of vulva 09/22/2018   Bunion of great toe of right foot 02/10/2018   Hemorrhoids 02/10/2018   Stress incontinence 08/17/2017   Hyperlipidemia 05/27/2016   Bursitis of shoulder 01/07/2015   Bicipital tendinitis 01/07/2015   Chronic obstructive pulmonary disease (HCC) 06/25/2013   Bipolar disorder, unspecified (HCC) 06/19/2013   Rotator cuff tendonitis 06/19/2013   Obesity 07/15/2011   TOBACCO USER 01/25/2009    Past Surgical History:  Procedure Laterality Date   HEMORRHOID SURGERY  2020   unsure of exact year   TUBAL LIGATION      OB History   No obstetric history on file.      Home Medications    Prior to  Admission medications  Medication Sig Start Date End Date Taking? Authorizing Provider  albuterol  (VENTOLIN  HFA) 108 (90 Base) MCG/ACT inhaler Inhale 1-2 puffs into the lungs every 6 (six) hours as needed for wheezing or shortness of breath. INHALE ONE TO TWO PUFFS BY MOUTH EVERY 6 HOURS AS NEEDED FOR WHEEZING AND FOR SHORTNESS OF BREATH 12/06/23  Yes Mabe, Elna, MD  cefdinir  (OMNICEF ) 300 MG capsule Take 1 capsule (300 mg total) by mouth 2 (two) times daily for 7 days. 04/30/24 05/07/24  Alba Sharper, MD  cephALEXin  (KEFLEX ) 500 MG capsule Take 1 capsule (500 mg total) by mouth 2 (two) times daily. Patient not taking: Reported on 12/06/2023 10/06/23   Stuart Vernell Norris, PA-C  fluticasone  furoate-vilanterol (BREO ELLIPTA ) 100-25 MCG/ACT AEPB Inhale 1 puff into the lungs daily. 12/09/23   Alba Sharper, MD    Family History Family History  Problem Relation Age of Onset   Breast cancer Mother    Cancer Mother        breast cancer at 4yo   Heart attack Mother        late 35s    COPD Mother    Dementia Father    Breast cancer Maternal Grandmother        around 41yo   Breast cancer Paternal Grandmother        around 47yo  Social History Social History[1]   Allergies   Patient has no known allergies.   Review of Systems Review of Systems  Constitutional: Negative.   HENT:  Positive for postnasal drip. Negative for congestion, dental problem, drooling, ear discharge, ear pain, facial swelling, hearing loss, mouth sores, nosebleeds, rhinorrhea, sinus pressure, sinus pain, sneezing, sore throat, tinnitus, trouble swallowing and voice change.   Respiratory:  Positive for cough, chest tightness and shortness of breath. Negative for apnea, choking, wheezing and stridor.      Physical Exam Triage Vital Signs ED Triage Vitals  Encounter Vitals Group     BP 05/03/24 1401 131/80     Girls Systolic BP Percentile --      Girls Diastolic BP Percentile --      Boys Systolic BP  Percentile --      Boys Diastolic BP Percentile --      Pulse Rate 05/03/24 1401 95     Resp 05/03/24 1401 (!) 22     Temp 05/03/24 1401 (!) 100.4 F (38 C)     Temp src --      SpO2 05/03/24 1401 96 %     Weight --      Height --      Head Circumference --      Peak Flow --      Pain Score 05/03/24 1358 3     Pain Loc --      Pain Education --      Exclude from Growth Chart --    No data found.  Updated Vital Signs BP 131/80   Pulse 95   Temp (!) 100.4 F (38 C)   Resp (!) 22   LMP 12/21/2020   SpO2 96%   Visual Acuity Right Eye Distance:   Left Eye Distance:   Bilateral Distance:    Right Eye Near:   Left Eye Near:    Bilateral Near:     Physical Exam Constitutional:      Appearance: Normal appearance. She is well-developed.  HENT:     Right Ear: Tympanic membrane, ear canal and external ear normal.     Left Ear: Tympanic membrane, ear canal and external ear normal.     Nose: No congestion.     Mouth/Throat:     Mouth: Mucous membranes are moist.     Pharynx: Oropharynx is clear. No posterior oropharyngeal erythema.  Eyes:     Extraocular Movements: Extraocular movements intact.  Cardiovascular:     Rate and Rhythm: Normal rate and regular rhythm.     Pulses: Normal pulses.     Heart sounds: Normal heart sounds.  Pulmonary:     Effort: Pulmonary effort is normal.     Breath sounds: Wheezing present.  Neurological:     Mental Status: She is alert and oriented to person, place, and time. Mental status is at baseline.      UC Treatments / Results  Labs (all labs ordered are listed, but only abnormal results are displayed) Labs Reviewed  POC COVID19/FLU A&B COMBO    EKG   Radiology No results found.  Procedures Procedures (including critical care time)  Medications Ordered in UC Medications - No data to display  Initial Impression / Assessment and Plan / UC Course  I have reviewed the triage vital signs and the nursing  notes.  Pertinent labs & imaging results that were available during my care of the patient were reviewed by me and considered in my medical decision making (  see chart for details).  , Fever, viral illness, COPD exacerbation  Fever of 100.4 noted in triage, patient in no signs of distress nontoxic-appearing, COVID and flu testing negative, discussed findings, wheezing heard to auscultation, O2 saturation 96% on room air, stable for outpatient management, symptoms present for 2 days, etiology most likely viral, discusse, flaring asthma/COPD, methylprednisolone  IM given prior to discharge and prescribed prednisone , Tessalon  and guaifenesin  codeine  for home use, PDMP reviewed, low risk. Final Clinical Impressions(s) / UC Diagnoses   Final diagnoses:  None   Discharge Instructions   None    ED Prescriptions   None    PDMP not reviewed this encounter.     [1]  Social History Tobacco Use   Smoking status: Every Day    Current packs/day: 1.00    Types: Cigarettes    Passive exposure: Current   Smokeless tobacco: Never   Tobacco comments:    Started smoking at age 58  Vaping Use   Vaping status: Never Used  Substance Use Topics   Alcohol use: Yes    Alcohol/week: 4.0 standard drinks of alcohol    Types: 4 Cans of beer per week    Comment: weekly weekends per pt, hard lemonade   Drug use: Yes    Types: Marijuana    Comment: pt states daily use     Teresa Shelba SAUNDERS, NP 05/03/24 1523  "

## 2024-05-04 ENCOUNTER — Ambulatory Visit: Payer: Self-pay | Admitting: Family Medicine

## 2024-05-29 ENCOUNTER — Ambulatory Visit (HOSPITAL_COMMUNITY): Admitting: Licensed Clinical Social Worker

## 2024-06-12 ENCOUNTER — Ambulatory Visit (HOSPITAL_COMMUNITY): Admitting: Licensed Clinical Social Worker

## 2024-06-26 ENCOUNTER — Ambulatory Visit (HOSPITAL_COMMUNITY): Admitting: Licensed Clinical Social Worker
